# Patient Record
Sex: Male | Born: 1950
Health system: Southern US, Community
[De-identification: ages and names within clinical notes are randomized; demographics above are authoritative.]

## PROBLEM LIST (undated history)

## (undated) DIAGNOSIS — K219 Gastro-esophageal reflux disease without esophagitis: Secondary | ICD-10-CM

## (undated) DIAGNOSIS — F329 Major depressive disorder, single episode, unspecified: Secondary | ICD-10-CM

## (undated) DIAGNOSIS — F419 Anxiety disorder, unspecified: Secondary | ICD-10-CM

## (undated) DIAGNOSIS — E039 Hypothyroidism, unspecified: Secondary | ICD-10-CM

## (undated) DIAGNOSIS — G4730 Sleep apnea, unspecified: Secondary | ICD-10-CM

## (undated) DIAGNOSIS — E785 Hyperlipidemia, unspecified: Secondary | ICD-10-CM

## (undated) DIAGNOSIS — I10 Essential (primary) hypertension: Secondary | ICD-10-CM

## (undated) DIAGNOSIS — F32A Depression, unspecified: Secondary | ICD-10-CM

## (undated) HISTORY — PX: ESOPHAGOGASTRODUODENOSCOPY: SHX1529

## (undated) HISTORY — PX: TONSILLECTOMY AND ADENOIDECTOMY: SHX28

## (undated) HISTORY — PX: JOINT REPLACEMENT: SHX530

---

## 2005-12-03 ENCOUNTER — Ambulatory Visit: Payer: Self-pay | Admitting: Gastroenterology

## 2006-08-26 ENCOUNTER — Ambulatory Visit: Payer: Self-pay

## 2006-09-21 ENCOUNTER — Other Ambulatory Visit: Payer: Self-pay

## 2006-09-21 ENCOUNTER — Emergency Department: Payer: Self-pay | Admitting: Emergency Medicine

## 2006-10-18 ENCOUNTER — Emergency Department: Payer: Self-pay

## 2007-01-03 ENCOUNTER — Ambulatory Visit: Payer: Self-pay | Admitting: Neurosurgery

## 2007-06-27 ENCOUNTER — Ambulatory Visit: Payer: Self-pay | Admitting: Neurosurgery

## 2008-05-04 ENCOUNTER — Ambulatory Visit: Payer: Self-pay | Admitting: Internal Medicine

## 2009-04-11 ENCOUNTER — Ambulatory Visit: Payer: Self-pay | Admitting: Otolaryngology

## 2009-05-08 ENCOUNTER — Ambulatory Visit: Payer: Self-pay | Admitting: Internal Medicine

## 2009-05-29 ENCOUNTER — Ambulatory Visit: Payer: Self-pay | Admitting: Unknown Physician Specialty

## 2009-07-07 ENCOUNTER — Emergency Department: Payer: Self-pay | Admitting: Emergency Medicine

## 2010-05-15 ENCOUNTER — Ambulatory Visit: Payer: Self-pay | Admitting: Internal Medicine

## 2010-12-15 ENCOUNTER — Ambulatory Visit: Payer: Self-pay | Admitting: Internal Medicine

## 2011-01-12 ENCOUNTER — Ambulatory Visit: Payer: Self-pay | Admitting: Internal Medicine

## 2011-10-27 ENCOUNTER — Ambulatory Visit: Payer: Self-pay | Admitting: Vascular Surgery

## 2011-11-25 ENCOUNTER — Ambulatory Visit: Payer: Self-pay | Admitting: Vascular Surgery

## 2011-11-25 LAB — BASIC METABOLIC PANEL
Anion Gap: 8 (ref 7–16)
BUN: 22 mg/dL — ABNORMAL HIGH (ref 7–18)
Calcium, Total: 9.2 mg/dL (ref 8.5–10.1)
Co2: 27 mmol/L (ref 21–32)
EGFR (African American): >60
EGFR (Non-African Amer.): >60
Glucose: 85 mg/dL (ref 65–99)
Osmolality: 286 (ref 275–301)
Sodium: 142 mmol/L (ref 136–145)

## 2011-11-25 LAB — CBC
HCT: 41.5 % (ref 40.0–52.0)
MCH: 32.7 pg (ref 26.0–34.0)
MCHC: 33.9 g/dL (ref 32.0–36.0)
MCV: 97 fL (ref 80–100)
Platelet: 223 10*3/uL (ref 150–440)
RBC: 4.3 10*6/uL — ABNORMAL LOW (ref 4.40–5.90)
RDW: 12.9 % (ref 11.5–14.5)

## 2011-12-02 ENCOUNTER — Inpatient Hospital Stay: Payer: Self-pay | Admitting: Vascular Surgery

## 2011-12-02 HISTORY — PX: ABDOMINAL AORTIC ANEURYSM REPAIR: SUR1152

## 2011-12-02 LAB — CBC WITH DIFFERENTIAL/PLATELET
Basophil #: 0 10*3/uL (ref 0.0–0.1)
Basophil %: 0.5 %
Eosinophil #: 0.1 10*3/uL (ref 0.0–0.7)
HGB: 13.6 g/dL (ref 13.0–18.0)
Lymphocyte %: 20.2 %
MCH: 33.3 pg (ref 26.0–34.0)
MCHC: 34.4 g/dL (ref 32.0–36.0)
Monocyte #: 0.4 x10 3/mm (ref 0.2–1.0)
Monocyte %: 5.5 %
Neutrophil #: 4.9 10*3/uL (ref 1.4–6.5)
Neutrophil %: 72.7 %
Platelet: 177 10*3/uL (ref 150–440)
RDW: 12.9 % (ref 11.5–14.5)

## 2011-12-03 LAB — CBC WITH DIFFERENTIAL/PLATELET
Basophil #: 0 10*3/uL (ref 0.0–0.1)
Basophil %: 0.3 %
Eosinophil #: 0.1 10*3/uL (ref 0.0–0.7)
Eosinophil %: 0.6 %
HCT: 36.5 % — ABNORMAL LOW (ref 40.0–52.0)
HGB: 12.8 g/dL — ABNORMAL LOW (ref 13.0–18.0)
Lymphocyte %: 15.3 %
MCH: 33.7 pg (ref 26.0–34.0)
MCHC: 35 g/dL (ref 32.0–36.0)
Monocyte #: 0.6 x10 3/mm (ref 0.2–1.0)
Monocyte %: 6.3 %
Neutrophil #: 7.8 10*3/uL — ABNORMAL HIGH (ref 1.4–6.5)
Neutrophil %: 77.5 %
WBC: 10 10*3/uL (ref 3.8–10.6)

## 2011-12-03 LAB — COMPREHENSIVE METABOLIC PANEL
Albumin: 3.4 g/dL (ref 3.4–5.0)
Alkaline Phosphatase: 66 U/L (ref 50–136)
Anion Gap: 8 (ref 7–16)
BUN: 10 mg/dL (ref 7–18)
Calcium, Total: 8.2 mg/dL — ABNORMAL LOW (ref 8.5–10.1)
Glucose: 101 mg/dL — ABNORMAL HIGH (ref 65–99)
Potassium: 3.8 mmol/L (ref 3.5–5.1)
SGOT(AST): 15 U/L (ref 15–37)
Total Protein: 6.1 g/dL — ABNORMAL LOW (ref 6.4–8.2)

## 2011-12-03 LAB — PROTIME-INR
INR: 1.3
Prothrombin Time: 16.3 secs — ABNORMAL HIGH (ref 11.5–14.7)

## 2011-12-03 LAB — PHOSPHORUS: Phosphorus: 2.8 mg/dL (ref 2.5–4.9)

## 2011-12-03 LAB — APTT: Activated PTT: 32 secs (ref 23.6–35.9)

## 2011-12-03 LAB — MAGNESIUM: Magnesium: 1.4 mg/dL — ABNORMAL LOW

## 2013-04-27 ENCOUNTER — Ambulatory Visit: Payer: Self-pay | Admitting: Unknown Physician Specialty

## 2013-04-28 LAB — PATHOLOGY REPORT

## 2013-10-25 DIAGNOSIS — F419 Anxiety disorder, unspecified: Secondary | ICD-10-CM | POA: Insufficient documentation

## 2013-10-25 DIAGNOSIS — F32A Depression, unspecified: Secondary | ICD-10-CM | POA: Insufficient documentation

## 2013-10-25 DIAGNOSIS — J449 Chronic obstructive pulmonary disease, unspecified: Secondary | ICD-10-CM | POA: Insufficient documentation

## 2013-10-25 DIAGNOSIS — F329 Major depressive disorder, single episode, unspecified: Secondary | ICD-10-CM | POA: Insufficient documentation

## 2014-06-12 NOTE — Op Note (Signed)
PATIENT NAME:  Mitchell Powell, Mitchell Powell MR#:  [id number] DATE OF BIRTH:  17-May-1950  DATE OF PROCEDURE:  12/02/2011  PREOPERATIVE DIAGNOSIS:  1. Abdominal aortic aneurysm.  2. Hypertension.  3. Hypercholesterolemia.   POSTOPERATIVE DIAGNOSIS:  1. Abdominal aortic aneurysm. 2. Hypertension. 3. Hypercholesterolemia.  PROCEDURE PERFORMED:   1. Ultrasound guidance for vascular access to bilateral femoral arteries, right by Dr. Lucky Cowboy, left by Dr. Delana Meyer.   2. Catheter placement into aorta from bilateral femoral approaches, right by Dr. Lucky Cowboy, left by Dr. Delana Meyer.  3. Placement of a Gore Excluder endoprosthesis, main body right, 26 mm diameter main body, 14 mm limbs,  co-surgeons.  4. Placement of a right iliac extender, 14 mm diameter by Dr. Lucky Cowboy.   COSURGEONS: Leotis Pain, MD and [name], MD   ANESTHESIA: General.   ESTIMATED BLOOD LOSS: Approximately 100 mL.   FLUOROSCOPY TIME:  Approximately 10 minutes.   INDICATION FOR PROCEDURE: The patient is a 64 year old white male with a greater than 5 cm abdominal aortic aneurysm with acceptable anatomy for endovascular repair. The options for repair were discussed with the patient and he desired to proceed.   DESCRIPTION OF PROCEDURE: The patient was brought to the Vascular Interventional Radiology Suite. Groins were sterilely prepped and draped, and a sterile surgical field was created. He had nice femoral arteries by his preoperative imaging, so a percutaneous technique was planned. Ultrasound was used to access both femoral arteries under direct ultrasound guidance without difficulty, and 5-French sheaths were placed. The preclose technique was placed with two Perclose devices. I performed the right and Dr. Delana Meyer  performed the left, and then over a wire I put an 8-French sheath. A pigtail catheter was placed in the aorta from the right side and AP aortogram was performed. This demonstrated normal origins of the renal vessels with the known abdominal  aortic aneurysm. There was no significant iliac stenosis. I up sized to an 18-French sheath. The patient was systemically heparinized with 6000 units of intravenous heparin, and I placed the main body through the 18-French sheath on the right. Dr. Delana Meyer placed a Kumpe catheter up on the left, and magnified views were performed at the level of the renal arteries. A stick graft was then deployed just below the right renal artery, which was lower; and Dr. Delana Meyer cannulated the contralateral gate without difficulty with a Kumpe catheter and a Stiff-Angled Glidewire and confirmed successful cannulation with twirling a pigtail catheter in the main body. He then placed a Stiff Wire up and performed a retrograde arteriogram in the left femoral system and iliac system. A 14 mm diameter x 14 cm length contralateral limb was then deployed on the left through a 12-French  sheath terminating 1 to 2 cm above the hypogastric artery with several centimeters seal within the iliac artery. A retrograde arteriogram was performed on the right. There was still approximately 4 to 5 cm below the main body to the right hypogastric artery, and for this reason I selected a 14 mm diameter extender and deployed this to get more seal in the iliacs. All junction points and seal zones were ironed out with the Compliant balloon, and a pigtail catheter was then placed up the left side, and a completion angiogram was then performed. This demonstrated excellent location of the endoprosthesis just below the renal arteries with patent renal arteries present, just above the hypogastric arteries with hypogastric arteries patent. There was no type 1, 2 or 3 endoleak seen and good flow through the graft.  At this point, we elected to terminate the procedure. The sheathes were removed. The Perclose devices were completed on Dr. Nino Parsley side with excellent hemostatic result. On the right, a third Perclose device was placed over the wire in the usual  fashion for some mild oozing with good hemostasis achieved after the Perclose devices were deployed. Pressure was held. Sterile dressing was placed. The patient tolerated the procedure well and was taken to the recovery room in stable condition.  ____________________________ Algernon Huxley, MD jsd:cbb D: 12/02/2011 10:37:07 ET T: 12/02/2011 11:06:45 ET JOB#: [id number]  cc: Algernon Huxley, MD, <Dictator> Algernon Huxley MD ELECTRONICALLY SIGNED 12/03/2011 10:15

## 2014-06-12 NOTE — Op Note (Signed)
PATIENT NAME:  Mitchell Powell, Mitchell Powell MR#:  [id number] DATE OF BIRTH:  March 29, 1950  DATE OF PROCEDURE:  12/02/2011  PREOPERATIVE DIAGNOSES:  1. Abdominal aortic aneurysm.  2. Hypertension.  3. Hypercholesterolemia.   POSTOPERATIVE DIAGNOSES: 1. Abdominal aortic aneurysm.  2. Hypertension.  3. Hypercholesterolemia.   PROCEDURES PERFORMED:  1. Ultrasound-guided access bilateral common femoral arteries.  2. Introduction catheter into aorta, bilateral femoral approach.  3. Placement of a Gore Excluder endograft main body for treatment of abdominal aortic aneurysm.  4. Placement of right iliac extender 14 mm by Dr. Lucky Cowboy.  5. Closure of arterial puncture sites using the Perclose device in a pre-close fashion bilaterally.    CO-SURGEONS: Katha Cabal, MD and Algernon Huxley, MD    ANESTHESIA: General by endotracheal intubation.   FLUIDS: Per anesthesia record.   ESTIMATED BLOOD LOSS: 100 mL.   FLUOROSCOPY TIME: Approximately 10 minutes.   INDICATION FOR PROCEDURE: The patient is a 64 year old gentleman with an abdominal aortic aneurysm that is now identified as being larger than 5 cm in diameter by CT scan. He is a good endograft candidate. He is, therefore, undergoing repair of his abdominal aortic aneurysm using Endograft techniques to prevent lethal rupture.   DESCRIPTION OF PROCEDURE: The patient is brought to Special Procedures and placed in the supine position. After adequate general anesthesia is induced and appropriate invasive monitors are placed, he is positioned supine and prepped from approximately the level of his nipples down to his knees. With Dr. Lucky Cowboy working on the right side and myself on the left side, the common femoral arteries are accessed in a similar fashion. Ultrasound is placed in a sterile sleeve. Right side is accessed first. The femoral artery is identified. It is echolucent and pulsatile indicating patency. Image is recorded for the permanent record and under real-time  visualization Seldinger needle is inserted into the anterior wall of the common femoral artery. J-wire is then advanced under fluoroscopy without difficulty. As noted working in a similar fashion, left side is accessed by myself, ultrasound is utilized, image is recorded, puncture is made under real-time as noted. Wire is then advanced.   The pre-close technique is then used first on the right and then on the left. Perclose device is advanced over the wire, positioned at approximately 11 o'clock orientation, deployed and marked with a curved hemostat. A second device is then introduced over the same wire and deployed at the 2 o'clock orientation and marked with a straight hemostat. This was also performed on the left side as well by me.   8 [place] sheaths are then placed. Pigtail catheter is advanced up the right, KMP catheter up the left, and AP aortogram is obtained. After verifying length measurements, an 18 French sheath is advanced up the right side. Amplatz Super Stiff wire was placed through the pigtail catheter. Pigtail catheter and 8 French sheath were removed. Subsequently, a 26 mm diameter main body is advanced up the right side and positioned just below the renals. KMP catheter that has been positioned for my side is then used in a magnified view to verify renal artery locations and the main body is deployed down through opening the contralateral gate. KMP catheter is then pulled back and using a Glidewire and the KMP catheter, the gate is captured and KMP catheter Glidewire are advanced into the main body. KMP is then exchanged for a pigtail catheter. Pigtail catheter is twirled to verify intraluminal positioning. It is then advanced up just above the graft  and another image is obtained verifying the graft has been deployed below the renals. Amplatz Super Stiff wire is then advanced through the pigtail catheter. The 8 French sheath is removed. While the pigtail catheter was in position, retrograde  injection through the 8 French sheath was made and length measurements for the contralateral limb were verified. A 14 x 14 limb was then opened onto the field. Sheath was upsized to a 12 [place] over the Super Stiff wire. The limb was then advanced through and positioned within the gate and deployed without difficulty. Coda balloon was then advanced through the 12 [place] sheath and the proximal as well as the length of the contralateral limb were angioplastied using the Coda balloon. The remaining portion of the main body was deployed. Retrograde injection demonstrates that there is only 1 to 2 cm of stent extending into the iliac and, therefore, a 14 mm extender cuff was advanced through the right side and deployed extending the right limb down to just above the iliac bifurcation. Coda balloon was then advanced through the right-sided sheath and used again to angioplasty the proximal as well as the entire length of the ipsilateral limb.   Pigtail catheter was then readvanced, positioned just above the stent graft, and bolus injection of contrast with delayed run was performed. A very faint type II endoleak was noted. Otherwise, the stent is in excellent position. There are no type I/type III endoleaks.   The left side was then treated first. With the pre-closed fashion the knots for the device were secured after removing the sheath. After the first knot is tied down there is minimal oozing and, therefore, the wire is removed. Second knot is then secured as well and there is complete hemostasis.   The right side is then addressed first securing the sutures, however, after both knots have been tied down fairly tight there is still significant oozing and it is elected to take a third Perclose, advance it over the wire and deployed in the 12 o'clock orientation. Following this, there is very little oozing and the wire is removed and all three knots have been secured one more time. This achieves complete  hemostasis, the sutures were cut, and light pressure is held. There were no immediate complications.   INTERPRETATION: Initial views demonstrate the aneurysm. At the completion there is a faint type II endoleak. No type I/type III endoleaks. There is adequate hemostasis with the Perclose devices.    ____________________________ Katha Cabal, MD ggs:drc D: 12/08/2011 08:56:08 ET T: 12/08/2011 09:18:49 ET JOB#: [id number]  cc: Katha Cabal, MD, <Dictator> Katha Cabal MD ELECTRONICALLY SIGNED 12/09/2011 12:34

## 2014-10-31 DIAGNOSIS — R0602 Shortness of breath: Secondary | ICD-10-CM | POA: Insufficient documentation

## 2014-11-21 ENCOUNTER — Emergency Department
Admission: EM | Admit: 2014-11-21 | Discharge: 2014-11-21 | Disposition: A | Discharge status: Home / Self Care | Payer: BLUE CROSS/BLUE SHIELD | Attending: Emergency Medicine | Admitting: Emergency Medicine

## 2014-11-21 ENCOUNTER — Emergency Department: Payer: Self-pay

## 2014-11-21 ENCOUNTER — Encounter: Payer: Self-pay | Admitting: Emergency Medicine

## 2014-11-21 DIAGNOSIS — K852 Alcohol induced acute pancreatitis without necrosis or infection: Secondary | ICD-10-CM

## 2014-11-21 DIAGNOSIS — I10 Essential (primary) hypertension: Secondary | ICD-10-CM | POA: Insufficient documentation

## 2014-11-21 DIAGNOSIS — F1010 Alcohol abuse, uncomplicated: Secondary | ICD-10-CM | POA: Insufficient documentation

## 2014-11-21 HISTORY — DX: Gastro-esophageal reflux disease without esophagitis: K21.9

## 2014-11-21 HISTORY — DX: Hyperlipidemia, unspecified: E78.5

## 2014-11-21 HISTORY — DX: Essential (primary) hypertension: I10

## 2014-11-21 LAB — COMPREHENSIVE METABOLIC PANEL
ALK PHOS: 72 U/L (ref 38–126)
ALT: 21 U/L (ref 17–63)
AST: 22 U/L (ref 15–41)
Albumin: 4.8 g/dL (ref 3.5–5.0)
Anion gap: 8 (ref 5–15)
BUN: 16 mg/dL (ref 6–20)
CALCIUM: 9.8 mg/dL (ref 8.9–10.3)
CO2: 27 mmol/L (ref 22–32)
CREATININE: 0.86 mg/dL (ref 0.61–1.24)
Chloride: 105 mmol/L (ref 101–111)
Glucose, Bld: 92 mg/dL (ref 65–99)
Potassium: 3.8 mmol/L (ref 3.5–5.1)
Sodium: 140 mmol/L (ref 135–145)
TOTAL PROTEIN: 8 g/dL (ref 6.5–8.1)
Total Bilirubin: 0.7 mg/dL (ref 0.3–1.2)

## 2014-11-21 LAB — URINALYSIS COMPLETE WITH MICROSCOPIC (ARMC ONLY)
Bacteria, UA: NONE SEEN
Bilirubin Urine: NEGATIVE
Glucose, UA: NEGATIVE mg/dL
Hgb urine dipstick: NEGATIVE
KETONES UR: NEGATIVE mg/dL
LEUKOCYTES UA: NEGATIVE
NITRITE: NEGATIVE
PH: 5 (ref 5.0–8.0)
PROTEIN: NEGATIVE mg/dL
SPECIFIC GRAVITY, URINE: 1.011 (ref 1.005–1.030)

## 2014-11-21 LAB — CBC
HCT: 45.9 % (ref 40.0–52.0)
Hemoglobin: 15.3 g/dL (ref 13.0–18.0)
MCH: 32.6 pg (ref 26.0–34.0)
MCHC: 33.3 g/dL (ref 32.0–36.0)
MCV: 98 fL (ref 80.0–100.0)
PLATELETS: 203 10*3/uL (ref 150–440)
RBC: 4.69 MIL/uL (ref 4.40–5.90)
RDW: 13 % (ref 11.5–14.5)
WBC: 13.3 10*3/uL — AB (ref 3.8–10.6)

## 2014-11-21 LAB — LIPASE, BLOOD: LIPASE: 738 U/L — AB (ref 22–51)

## 2014-11-21 MED ORDER — RANITIDINE HCL 150 MG PO CAPS: 150.0000 mg | ORAL_CAPSULE | Freq: Two times a day (BID) | ORAL | Status: DC

## 2014-11-21 MED ORDER — OXYCODONE-ACETAMINOPHEN 5-325 MG PO TABS: 1.0000 | ORAL_TABLET | Freq: Four times a day (QID) | ORAL | Status: DC | PRN

## 2014-11-21 NOTE — Discharge Instructions (Signed)

## 2014-11-21 NOTE — ED Notes (Signed)
Pt to ed with c/o abd pain left upper quad pain, denies vomiting, denies diarrhea.

## 2014-11-21 NOTE — ED Provider Notes (Signed)
[facility] Emergency Department Provider Note     Time seen: ----------------------------------------- 4:40 PM on 11/21/2014 -----------------------------------------    I have reviewed the triage vital signs and the nursing notes.   HISTORY  Chief Complaint Abdominal Pain    HPI Mitchell Powell is a 64 y.o. male who presents ER for left sided abdominal pain. Pain is dull, nothing makes it better or worse. Patient denies fevers, chills, chest pain, vomiting or diarrhea. Patient denies blood in the stool or urine. Patient's had a kidney stone years ago, does not remember what it felt like.   Past Medical History  Diagnosis Date  . Hypertension   . Hyperlipidemia   . GERD (gastroesophageal reflux disease)     There are no active problems to display for this patient.   History reviewed. No pertinent past surgical history.  Allergies Review of patient's allergies indicates no known allergies.  Social History Social History  Substance Use Topics  . Smoking status: Never Smoker   . Smokeless tobacco: None  . Alcohol Use: Yes    Review of Systems Constitutional: Negative for fever. Eyes: Negative for visual changes. ENT: Negative for sore throat. Cardiovascular: Negative for chest pain. Respiratory: Negative for shortness of breath. Gastrointestinal: Positive for abdominal pain, negative for vomiting and diarrhea Genitourinary: Negative for dysuria. Musculoskeletal: Negative for back pain. Skin: Negative for rash. Neurological: Negative for headaches, focal weakness or numbness.  10-point ROS otherwise negative.  ____________________________________________   PHYSICAL EXAM:  VITAL SIGNS: ED Triage Vitals  Enc Vitals Group     BP 11/21/14 1534 130/89 mmHg     Pulse Rate 11/21/14 1534 57     Resp 11/21/14 1534 18     Temp 11/21/14 1534 98.3 F (36.8 C)     Temp Source 11/21/14 1534 Oral     SpO2 11/21/14 1534 97 %     Weight --       Height --      Head Cir --      Peak Flow --      Pain Score 11/21/14 1455 7     Pain Loc --      Pain Edu? --      Excl. in [place]? --     Constitutional: Alert and oriented. Well appearing and in no distress. Eyes: Conjunctivae are normal. PERRL. Normal extraocular movements. ENT   Head: Normocephalic and atraumatic.   Nose: No congestion/rhinnorhea.   Mouth/Throat: Mucous membranes are moist.   Neck: No stridor. Cardiovascular: Normal rate, regular rhythm. Normal and symmetric distal pulses are present in all extremities. No murmurs, rubs, or gallops. Respiratory: Normal respiratory effort without tachypnea nor retractions. Breath sounds are clear and equal bilaterally. No wheezes/rales/rhonchi. Gastrointestinal: Left mid quadrant tenderness, no rebound or guarding. Normal bowel sounds. Musculoskeletal: Nontender with normal range of motion in all extremities. No joint effusions.  No lower extremity tenderness nor edema. Neurologic:  Normal speech and language. No gross focal neurologic deficits are appreciated. Speech is normal. No gait instability. Skin:  Skin is warm, dry and intact. No rash noted. Psychiatric: Mood and affect are normal. Speech and behavior are normal. Patient exhibits appropriate insight and judgment.  ____________________________________________  ED COURSE:  Pertinent labs & imaging results that were available during my care of the patient were reviewed by me and considered in my medical decision making (see chart for details). Check basic labs, likely imaging to rule out renal colic versus diverticulitis. ____________________________________________    LABS (pertinent  positives/negatives)  Labs Reviewed  LIPASE, BLOOD - Abnormal; Notable for the following:    Lipase 738 (*)    All other components within normal limits  CBC - Abnormal; Notable for the following:    WBC 13.3 (*)    All other components within normal limits  URINALYSIS  COMPLETEWITH MICROSCOPIC ([facility] ONLY) - Abnormal; Notable for the following:    Color, Urine STRAW (*)    APPearance CLEAR (*)    Squamous Epithelial / LPF 0-5 (*)    All other components within normal limits  COMPREHENSIVE METABOLIC PANEL    RADIOLOGY Images were viewed by me  CT renal protocol  IMPRESSION: Findings consistent with pancreatitis without evidence of pancreatic pseudocyst or pancreatic necrosis. Clinical correlation with pancreatic enzymes is recommended.  No nephrolithiasis or hydroureteronephrosis bilaterally. ____________________________________________  FINAL ASSESSMENT AND PLAN  Flank pain, pancreatitis  Plan: Patient with labs and imaging as dictated above.  Patient admits to drinking alcohol daily, likely the etiology for his pancreatitis. He has very mild pain at this moment, despite his markedly lipase elevation he wants to go home. He is advised to have limited dietary intake, he'll be given pain medicine and antiemetics at home. He'll be referred to gastroenterology for follow-up. Pancreatitis is alcohol related   Earleen Newport, MD   Earleen Newport, MD 11/21/14 2029

## 2015-01-01 ENCOUNTER — Other Ambulatory Visit: Payer: Self-pay | Admitting: Internal Medicine

## 2015-01-01 DIAGNOSIS — R1012 Left upper quadrant pain: Secondary | ICD-10-CM

## 2015-01-07 ENCOUNTER — Ambulatory Visit: Payer: Self-pay

## 2015-01-11 ENCOUNTER — Ambulatory Visit
Admission: RE | Admit: 2015-01-11 | Discharge: 2015-01-11 | Disposition: A | Discharge status: Home / Self Care | Payer: PPO | Source: Ambulatory Visit | Attending: Internal Medicine | Admitting: Internal Medicine

## 2015-01-11 DIAGNOSIS — R1012 Left upper quadrant pain: Secondary | ICD-10-CM | POA: Diagnosis not present

## 2016-01-21 DIAGNOSIS — G4733 Obstructive sleep apnea (adult) (pediatric): Secondary | ICD-10-CM | POA: Insufficient documentation

## 2016-10-22 ENCOUNTER — Other Ambulatory Visit (INDEPENDENT_AMBULATORY_CARE_PROVIDER_SITE_OTHER): Payer: Self-pay | Admitting: Vascular Surgery

## 2016-10-22 DIAGNOSIS — I739 Peripheral vascular disease, unspecified: Secondary | ICD-10-CM

## 2016-10-22 DIAGNOSIS — I700 Atherosclerosis of aorta: Secondary | ICD-10-CM

## 2016-10-22 DIAGNOSIS — I724 Aneurysm of artery of lower extremity: Secondary | ICD-10-CM

## 2016-10-22 DIAGNOSIS — Z95828 Presence of other vascular implants and grafts: Secondary | ICD-10-CM

## 2016-10-27 ENCOUNTER — Ambulatory Visit (INDEPENDENT_AMBULATORY_CARE_PROVIDER_SITE_OTHER): Payer: Medicare HMO | Admitting: Vascular Surgery

## 2016-10-27 ENCOUNTER — Ambulatory Visit (INDEPENDENT_AMBULATORY_CARE_PROVIDER_SITE_OTHER): Payer: Medicare HMO

## 2016-10-27 ENCOUNTER — Encounter (INDEPENDENT_AMBULATORY_CARE_PROVIDER_SITE_OTHER): Payer: Self-pay | Admitting: Vascular Surgery

## 2016-10-27 VITALS — BP 129/72 | HR 49 | Resp 15 | Ht 73.0 in | Wt 209.0 lb

## 2016-10-27 DIAGNOSIS — I724 Aneurysm of artery of lower extremity: Secondary | ICD-10-CM | POA: Diagnosis not present

## 2016-10-27 DIAGNOSIS — I700 Atherosclerosis of aorta: Secondary | ICD-10-CM

## 2016-10-27 DIAGNOSIS — Z95828 Presence of other vascular implants and grafts: Secondary | ICD-10-CM | POA: Diagnosis not present

## 2016-10-27 DIAGNOSIS — I10 Essential (primary) hypertension: Secondary | ICD-10-CM | POA: Diagnosis not present

## 2016-10-27 DIAGNOSIS — I739 Peripheral vascular disease, unspecified: Secondary | ICD-10-CM | POA: Diagnosis not present

## 2016-10-27 DIAGNOSIS — I714 Abdominal aortic aneurysm, without rupture, unspecified: Secondary | ICD-10-CM | POA: Insufficient documentation

## 2016-10-27 NOTE — Assessment & Plan Note (Signed)
His noninvasive studies demonstrate stable bilateral femoral artery aneurysms measuring 1.5 cm on the right and 1.6 cm on the left. His popliteal artery aneurysms are also stable and measured 1.2 cm on the right and 1.0 cm on the left. No significant lower extremity arterial stenosis is identified in either lower extremity and his ABIs are normal at 1.2 bilaterally.  He has moderate sized bilateral femoral and popliteal artery aneurysms without associated occlusive disease. These have been stable. Tobacco and blood pressure control or again stressed. Plan to recheck on an annual basis. No intervention required at this size.

## 2016-10-27 NOTE — Assessment & Plan Note (Signed)
5 years status post endovascular repair. Aorta measuring only 3.3 cm without endoleak. Recheck in 1 year

## 2016-10-27 NOTE — Patient Instructions (Signed)
Abdominal Aortic Aneurysm Endograft Repair, Care After This sheet gives you information about how to care for yourself after your procedure. Your health care provider may also give you more specific instructions. If you have problems or questions, contact your health care provider. What can I expect after the procedure? After the procedure, it is common to have:  Pain or soreness at the incision site.  Tiredness (fatigue).  Follow these instructions at home: Activity  Get plenty of rest.  Follow instructions from your health care provider about how much you should move around and how far you should go when you take short walks. Start walking farther when your health care provider says it is okay.  Limit your activities as told by your health care provider.  Return to your normal activities as told by your health care provider. Ask your health care provider what activities are safe for you. Incision care   Follow instructions from your health care provider about how to take care of your incisions. Make sure you: ? Wash your hands with soap and water before you change your bandage (dressing). If soap and water are not available, use hand sanitizer. ? Change your dressing as told by your health care provider. ? Leave stitches (sutures), skin glue, or adhesive strips in place. These skin closures may need to stay in place for 2 weeks or longer. If adhesive strip edges start to loosen and curl up, you may trim the loose edges. Do not remove adhesive strips completely unless your health care provider tells you to do that.  Keep the incision area clean and dry.  Do not take baths, swim, or use a hot tub until your health care provider approves. Ask your health care provider if you can take showers. You may only be allowed to take sponge baths for bathing.  Check your incision area every day for signs of infection. Check for: ? More redness, swelling, or pain. ? More fluid or  blood. ? Warmth. ? Pus or a bad smell. Lifestyle  Do not use any products that contain nicotine or tobacco, such as cigarettes and e-cigarettes. If you need help quitting, ask your health care provider.  Make any other lifestyle changes that your health care provider suggests. These may include: ? Keeping your blood pressure under control. ? Finding ways to lower stress. ? Eating healthy foods that are good for your heart, such as vegetables, fruits, and whole grains that add fiber to your diet. ? Getting regular exercise. General instructions  Take over-the-counter and prescription medicines only as told by your health care provider.  Keep all follow-up visits as told by your health care provider. This is important. Contact a health care provider if:  You have pain in your abdomen, chest, or back.  You have more redness, swelling, or pain around an incision.  You have more fluid or blood coming from an incision.  Your incision feels warm to the touch.  You have pus or a bad smell coming from an incision.  You have a fever. Get help right away if:  You have trouble breathing.  You suddenly have pain in your legs or you have trouble moving either of your legs.  You faint or you feel very light-headed. This information is not intended to replace advice given to you by your health care provider. Make sure you discuss any questions you have with your health care provider. Document Released: 10/31/2014 Document Revised: 08/30/2015 Document Reviewed: 05/06/2015 Elsevier Interactive Patient Education    2018 [employer]  

## 2016-10-27 NOTE — Assessment & Plan Note (Signed)
blood pressure control important in reducing the progression of atherosclerotic disease. On appropriate oral medications.  

## 2016-10-27 NOTE — Progress Notes (Signed)
MRN : [id number]  Mitchell Powell is a 66 y.o. (27-Oct-1950) male who presents with chief complaint of  Chief Complaint  Patient presents with  . Follow-up    1 Year u/s follow up  .  History of Present Illness: Patient returns today in follow up of aneurysmal disease. He is 5 years status post stent graft repair for an abdominal aortic aneurysm and has known common femoral and popliteal artery aneurysms that we have been watching with noninvasive studies. He is doing well without complaints today. He is quit smoking. His blood pressure is well controlled. His noninvasive studies demonstrate stable bilateral femoral artery aneurysms measuring 1.5 cm on the right and 1.6 cm on the left. His popliteal artery aneurysms are also stable and measured 1.2 cm on the right and 1.0 cm on the left. No significant lower extremity arterial stenosis is identified in either lower extremity and his ABIs are normal at 1.2 bilaterally. He is also studded with an aortic duplex. He is 5 years status post endovascular repair. His sac measures only 3.3 cm in maximal diameter and no endoleak is identified.  Current Outpatient Prescriptions  Medication Sig Dispense Refill  . albuterol (PROVENTIL HFA;VENTOLIN HFA) 108 (90 Base) MCG/ACT inhaler Inhale into the lungs.    Marland Kitchen aspirin EC 81 MG tablet Take by mouth.    Marland Kitchen azelastine (ASTELIN) 0.1 % nasal spray Place into the nose.    . benazepril (LOTENSIN) 20 MG tablet Take by mouth.    . clonazePAM (KLONOPIN) 0.5 MG tablet     . FLUoxetine (PROZAC) 20 MG capsule Take by mouth.    . fluticasone (FLONASE) 50 MCG/ACT nasal spray Place into the nose.    . loratadine (CLARITIN) 10 MG tablet Take by mouth.    . montelukast (SINGULAIR) 10 MG tablet Take by mouth.    . Omega-3 Fatty Acids (FISH OIL) 1000 MG CPDR Take by mouth.    Marland Kitchen omeprazole (PRILOSEC) 20 MG capsule     . oxyCODONE-acetaminophen (ROXICET) 5-325 MG tablet Take 1 tablet by mouth every 6 (six) hours as needed for  severe pain. 20 tablet 0  . pravastatin (PRAVACHOL) 40 MG tablet Take by mouth.    . ranitidine (ZANTAC) 150 MG capsule Take 1 capsule (150 mg total) by mouth 2 (two) times daily. 60 capsule 0   No current facility-administered medications for this visit.     Past Medical History:  Diagnosis Date  . GERD (gastroesophageal reflux disease)   . Hyperlipidemia   . Hypertension     No past surgical history on file.  Social History Previous smoker and quit many years ago. No alcohol abuse.  Family History No bleeding or clotting disorders  Allergies  Allergen Reactions  . Prednisone     Other reaction(s): Other (See Comments) irritiability     REVIEW OF SYSTEMS (Negative unless checked)  Constitutional: [] Weight loss  [] Fever  [] Chills Cardiac: [] Chest pain   [] Chest pressure   [] Palpitations   [] Shortness of breath when laying flat   [] Shortness of breath at rest   [] Shortness of breath with exertion. Vascular:  [] Pain in legs with walking   [] Pain in legs at rest   [] Pain in legs when laying flat   [] Claudication   [] Pain in feet when walking  [] Pain in feet at rest  [] Pain in feet when laying flat   [] History of DVT   [] Phlebitis   [x] Swelling in legs   [] Varicose veins   [] Non-healing ulcers  Pulmonary:   [] Uses home oxygen   [] Productive cough   [] Hemoptysis   [] Wheeze  [] COPD   [] Asthma Neurologic:  [] Dizziness  [] Blackouts   [] Seizures   [] History of stroke   [] History of TIA  [] Aphasia   [] Temporary blindness   [] Dysphagia   [] Weakness or numbness in arms   [] Weakness or numbness in legs Musculoskeletal:  [] Arthritis   [x] Joint swelling   [] Joint pain   [] Low back pain Hematologic:  [] Easy bruising  [] Easy bleeding   [] Hypercoagulable state   [] Anemic   Gastrointestinal:  [] Blood in stool   [] Vomiting blood  [] Gastroesophageal reflux/heartburn   [] Abdominal pain Genitourinary:  [] Chronic kidney disease   [] Difficult urination  [] Frequent urination  [] Burning with urination    [] Hematuria Skin:  [] Rashes   [] Ulcers   [] Wounds Psychological:  [] History of anxiety   []  History of major depression.  Physical Examination  BP 129/72 (BP Location: Right Arm)   Pulse (!) 49   Resp 15   Ht 6\' 1"  (1.854 m)   Wt 94.8 kg (209 lb)   BMI 27.57 kg/m  Gen:  WD/WN, NAD Head: [place]/AT, No temporalis wasting. Ear/Nose/Throat: Hearing grossly intact, nares w/o erythema or drainage, trachea midline Eyes: Conjunctiva clear. Sclera non-icteric Neck: Supple.  No JVD.  Pulmonary:  Good air movement, no use of accessory muscles.  Cardiac: RRR, normal S1, S2 Vascular:  Vessel Right Left  Radial Palpable Palpable              Aorta Not palpable N/A  Femoral Enlarged, Palpable Enlarged, Palpable  Popliteal Enlarged, Palpable Enlarged, Palpable  PT Palpable Palpable  DP Palpable Palpable    Musculoskeletal: M/S 5/5 throughout.  No deformity or atrophy. No edema. Neurologic: Sensation grossly intact in extremities.  Symmetrical.  Speech is fluent.  Psychiatric: Judgment intact, Mood & affect appropriate for pt's clinical situation. Dermatologic: No rashes or ulcers noted.  No cellulitis or open wounds.       Labs No results found for this or any previous visit (from the past 2160 hour(s)).  Radiology No results found.   Assessment/Plan  Hypertension blood pressure control important in reducing the progression of atherosclerotic disease. On appropriate oral medications.   AAA (abdominal aortic aneurysm) without rupture ([place]) 5 years status post endovascular repair. Aorta measuring only 3.3 cm without endoleak. Recheck in 1 year  Popliteal artery aneurysm (HCC) His noninvasive studies demonstrate stable bilateral femoral artery aneurysms measuring 1.5 cm on the right and 1.6 cm on the left. His popliteal artery aneurysms are also stable and measured 1.2 cm on the right and 1.0 cm on the left. No significant lower extremity arterial stenosis is identified in either  lower extremity and his ABIs are normal at 1.2 bilaterally.  He has moderate sized bilateral femoral and popliteal artery aneurysms without associated occlusive disease. These have been stable. Tobacco and blood pressure control or again stressed. Plan to recheck on an annual basis. No intervention required at this size.  Femoral artery aneurysm, bilateral (HCC) His noninvasive studies demonstrate stable bilateral femoral artery aneurysms measuring 1.5 cm on the right and 1.6 cm on the left. His popliteal artery aneurysms are also stable and measured 1.2 cm on the right and 1.0 cm on the left. No significant lower extremity arterial stenosis is identified in either lower extremity and his ABIs are normal at 1.2 bilaterally.  He has moderate sized bilateral femoral and popliteal artery aneurysms without associated occlusive disease. These have been stable. Tobacco and  blood pressure control or again stressed. Plan to recheck on an annual basis. No intervention required at this size.    Leotis Pain, MD  10/27/2016 10:07 AM    This note was created with Dragon medical transcription system.  Any errors from dictation are purely unintentional

## 2017-10-27 DIAGNOSIS — K219 Gastro-esophageal reflux disease without esophagitis: Secondary | ICD-10-CM | POA: Insufficient documentation

## 2017-10-27 DIAGNOSIS — E785 Hyperlipidemia, unspecified: Secondary | ICD-10-CM | POA: Insufficient documentation

## 2017-10-27 NOTE — Progress Notes (Signed)
MRN : [id number]  Mitchell Powell is a 68 y.o. (1950-03-04) male who presents with chief complaint of No chief complaint on file. Marland Kitchen  History of Present Illness:   The patient returns to the office for surveillance of an abdominal aortic aneurysm status post stent graft placement on 11/2011.   Patient denies abdominal pain or back pain, no other abdominal complaints. No groin related complaints. No symptoms consistent with distal embolization No changes in claudication distance.   There have been no interval changes in his overall healthcare since his last visit.   He is quit smoking.  Patient denies amaurosis fugax or TIA symptoms. There is no history of claudication or rest pain symptoms of the lower extremities. The patient denies angina or shortness of breath.   Previous studies: Duplex US of the aorta and iliac arteries shows a 3.3 cm AAA sac with no endoleak, no in the sac compared to the previous study.  Bilateral femoral artery aneurysms measuring 1.5 cm on the right and 1.6 cm on the left. Popliteal artery aneurysms are also stable and measured 1.2 cm on the right and 1.0 cm on the left.    No outpatient medications have been marked as taking for the 10/28/17 encounter (Appointment) with Delana Meyer, Dolores Lory, MD.    Past Medical History:  Diagnosis Date  . GERD (gastroesophageal reflux disease)   . Hyperlipidemia   . Hypertension     No past surgical history on file.  Social History Social History   Tobacco Use  . Smoking status: Never Smoker  . Smokeless tobacco: Never Used  Substance Use Topics  . Alcohol use: Yes  . Drug use: No    Family History No family history on file.  Allergies  Allergen Reactions  . Prednisone     Other reaction(s): Other (See Comments) irritiability     REVIEW OF SYSTEMS (Negative unless checked)  Constitutional: [] Weight loss  [] Fever  [] Chills Cardiac: [] Chest pain   [] Chest pressure   [] Palpitations   [] Shortness of  breath when laying flat   [] Shortness of breath with exertion. Vascular:  [] Pain in legs with walking   [] Pain in legs at rest  [] History of DVT   [] Phlebitis   [] Swelling in legs   [] Varicose veins   [] Non-healing ulcers Pulmonary:   [] Uses home oxygen   [] Productive cough   [] Hemoptysis   [] Wheeze  [] COPD   [] Asthma Neurologic:  [] Dizziness   [] Seizures   [] History of stroke   [] History of TIA  [] Aphasia   [] Vissual changes   [] Weakness or numbness in arm   [] Weakness or numbness in leg Musculoskeletal:   [] Joint swelling   [] Joint pain   [] Low back pain Hematologic:  [] Easy bruising  [] Easy bleeding   [] Hypercoagulable state   [] Anemic Gastrointestinal:  [] Diarrhea   [] Vomiting  [] Gastroesophageal reflux/heartburn   [] Difficulty swallowing. Genitourinary:  [] Chronic kidney disease   [] Difficult urination  [] Frequent urination   [] Blood in urine Skin:  [] Rashes   [] Ulcers  Psychological:  [] History of anxiety   []  History of major depression.  Physical Examination  There were no vitals filed for this visit. There is no height or weight on file to calculate BMI. Gen: WD/WN, NAD Head: [place]/AT, No temporalis wasting.  Ear/Nose/Throat: Hearing grossly intact, nares w/o erythema or drainage Eyes: PER, EOMI, sclera nonicteric.  Neck: Supple, no large masses.   Pulmonary:  Good air movement, no audible wheezing bilaterally, no use of accessory muscles.  Cardiac: RRR,  no JVD Vascular:  Vessel Right Left  Radial Palpable Palpable  Gastrointestinal: Non-distended. No guarding/no peritoneal signs.  Musculoskeletal: M/S 5/5 throughout.  No deformity or atrophy.  Neurologic: CN 2-12 intact. Symmetrical.  Speech is fluent. Motor exam as listed above. Psychiatric: Judgment intact, Mood & affect appropriate for pt's clinical situation. Dermatologic: No rashes or ulcers noted.  No changes consistent with cellulitis. Lymph : No lichenification or skin changes of chronic lymphedema.  CBC Lab Results    Component Value Date   WBC 13.3 (H) 11/21/2014   HGB 15.3 11/21/2014   HCT 45.9 11/21/2014   MCV 98.0 11/21/2014   PLT 203 11/21/2014    BMET    Component Value Date/Time   NA 140 11/21/2014 1455   NA 141 12/03/2011 0450   K 3.8 11/21/2014 1455   K 3.8 12/03/2011 0450   CL 105 11/21/2014 1455   CL 107 12/03/2011 0450   CO2 27 11/21/2014 1455   CO2 26 12/03/2011 0450   GLUCOSE 92 11/21/2014 1455   GLUCOSE 101 (H) 12/03/2011 0450   BUN 16 11/21/2014 1455   BUN 10 12/03/2011 0450   CREATININE 0.86 11/21/2014 1455   CREATININE 0.85 12/03/2011 0450   CALCIUM 9.8 11/21/2014 1455   CALCIUM 8.2 (L) 12/03/2011 0450   GFRNONAA >60 11/21/2014 1455   GFRNONAA >60 12/03/2011 0450   GFRAA >60 11/21/2014 1455   GFRAA >60 12/03/2011 0450   CrCl cannot be calculated (Patient's most recent lab result is older than the maximum 21 days allowed.).  COAG Lab Results  Component Value Date   INR 1.3 12/03/2011    Radiology No results found.   Assessment/Plan 1. AAA (abdominal aortic aneurysm) without rupture [facility]) Patient will return with appropriate noninvasive studies and follow-up with Dr. Lucky Cowboy.  No charge for today's visit  2. Popliteal artery aneurysm ([place]) See #1  3. Essential hypertension Continue antihypertensive medications as already ordered, these medications have been reviewed and there are no changes at this time.   4. Femoral artery aneurysm, bilateral ([place]) See #1  5. Gastroesophageal reflux disease, esophagitis presence not specified Continue PPI as already ordered, this medication has been reviewed and there are no changes at this time.  Avoidence of caffeine and alcohol  Moderate elevation of the head of the bed   6. Mixed hyperlipidemia Continue statin as ordered and reviewed, no changes at this time     [name], MD  10/27/2017 1:50 PM

## 2017-10-28 ENCOUNTER — Ambulatory Visit (INDEPENDENT_AMBULATORY_CARE_PROVIDER_SITE_OTHER): Payer: Medicare HMO | Admitting: Vascular Surgery

## 2017-10-28 ENCOUNTER — Encounter (INDEPENDENT_AMBULATORY_CARE_PROVIDER_SITE_OTHER): Payer: Self-pay | Admitting: Vascular Surgery

## 2017-10-28 VITALS — BP 130/74 | HR 52 | Resp 16 | Ht 73.0 in | Wt 210.4 lb

## 2017-10-28 DIAGNOSIS — E782 Mixed hyperlipidemia: Secondary | ICD-10-CM

## 2017-10-28 DIAGNOSIS — K219 Gastro-esophageal reflux disease without esophagitis: Secondary | ICD-10-CM

## 2017-10-28 DIAGNOSIS — I724 Aneurysm of artery of lower extremity: Secondary | ICD-10-CM

## 2017-10-28 DIAGNOSIS — I714 Abdominal aortic aneurysm, without rupture, unspecified: Secondary | ICD-10-CM

## 2017-10-28 DIAGNOSIS — I10 Essential (primary) hypertension: Secondary | ICD-10-CM

## 2017-10-29 ENCOUNTER — Encounter (INDEPENDENT_AMBULATORY_CARE_PROVIDER_SITE_OTHER): Payer: Self-pay | Admitting: Vascular Surgery

## 2017-11-12 DIAGNOSIS — R079 Chest pain, unspecified: Secondary | ICD-10-CM | POA: Insufficient documentation

## 2017-11-19 ENCOUNTER — Ambulatory Visit (INDEPENDENT_AMBULATORY_CARE_PROVIDER_SITE_OTHER): Payer: Medicare HMO | Admitting: Nurse Practitioner

## 2017-11-19 ENCOUNTER — Ambulatory Visit (INDEPENDENT_AMBULATORY_CARE_PROVIDER_SITE_OTHER): Payer: Medicare HMO

## 2017-11-19 ENCOUNTER — Encounter (INDEPENDENT_AMBULATORY_CARE_PROVIDER_SITE_OTHER): Payer: Self-pay | Admitting: Nurse Practitioner

## 2017-11-19 ENCOUNTER — Encounter (INDEPENDENT_AMBULATORY_CARE_PROVIDER_SITE_OTHER): Payer: Medicare HMO

## 2017-11-19 VITALS — BP 138/80 | HR 67 | Resp 16 | Ht 73.0 in | Wt 211.0 lb

## 2017-11-19 DIAGNOSIS — I714 Abdominal aortic aneurysm, without rupture, unspecified: Secondary | ICD-10-CM

## 2017-11-19 DIAGNOSIS — I724 Aneurysm of artery of lower extremity: Secondary | ICD-10-CM | POA: Diagnosis not present

## 2017-11-19 DIAGNOSIS — K219 Gastro-esophageal reflux disease without esophagitis: Secondary | ICD-10-CM | POA: Diagnosis not present

## 2017-11-22 ENCOUNTER — Encounter (INDEPENDENT_AMBULATORY_CARE_PROVIDER_SITE_OTHER): Payer: Self-pay | Admitting: Nurse Practitioner

## 2017-11-22 NOTE — Progress Notes (Signed)
Subjective:    Patient ID: Mitchell Powell, male    DOB: Dec 10, 1950, 67 y.o.   MRN: [id number] Chief Complaint  Patient presents with  . Follow-up    ultrasound results    HPI  Mitchell Powell is a 67 y.o. male that is following up today following repair of a abdominal aortic aneurysm about 5 years ago.  The repair was done with stent graft.  The patient also has known common femoral and popliteal artery aneurysms that have been followed with noninvasive studies.  The patient states that he is in his usual state of health, with no changes.  The patient has continued with smoking cessation.  Patient denies any fever, chills, nausea, vomiting, diarrhea.  Patient denies any chest pain or shortness of breath.  Patient denies any amaurosis fugax or TIA-like symptoms.  He denies any claudication-like symptoms.  Today the patient underwent an duplex of his endovascular aortic repair.  The maximal diameter of the aorta was 3.11 cm, compared to 3.3 cm obtained on 10/27/2016.  There is no evidence of endoleak.  The patient also underwent bilateral lower extremity arterial duplexes to evaluate his femoral and popliteal aneurysms.  The bilateral common femoral arteries note focal dilations.  The maximal diameter of the right common femoral artery is 1.47, whereas the right is 1.52.  The maximum diameter on the right popliteal artery is 1.07 cm whereas on the left it is 1.06 cm.  There is no significant change compared to previous exam on 10/27/2016.  No evidence of significant arterial obstruction noted bilaterally.  Review of Systems   Review of Systems: Negative Unless Checked Constitutional: [] Weight loss  [] Fever  [] Chills Cardiac: [] Chest pain   ? Atrial Fibrillation  [] Palpitations   [] Shortness of breath when laying flat   [] Shortness of breath with exertion. Vascular:  [] Pain in legs with walking   [] Pain in legs with standing  [] History of DVT   [] Phlebitis   [] Swelling in legs   [] Varicose veins    [] Non-healing ulcers Pulmonary:   [] Uses home oxygen   [] Productive cough   [] Hemoptysis   [] Wheeze  [] COPD   [] Asthma Neurologic:  [] Dizziness   [] Seizures   [] History of stroke   [] History of TIA  [] Aphasia   [] Vissual changes   [] Weakness or numbness in arm   [] Weakness or numbness in leg Musculoskeletal:   [x] Joint swelling   [] Joint pain   [] Low back pain  ? History of Knee Replacement Hematologic:  [] Easy bruising  [] Easy bleeding   [] Hypercoagulable state   [] Anemic Gastrointestinal:  [] Diarrhea   [] Vomiting  [] Gastroesophageal reflux/heartburn   [] Difficulty swallowing. Genitourinary:  [] Chronic kidney disease   [] Difficult urination  [] Anuric   [] Blood in urine Skin:  [] Rashes   [] Ulcers  Psychological:  [] History of anxiety   []  History of major depression  ? Memory Difficulties     Objective:   Physical Exam  BP 138/80 (BP Location: Right Arm)   Pulse 67   Resp 16   Ht 6\' 1"  (1.854 m)   Wt 211 lb (95.7 kg)   BMI 27.84 kg/m   Past Medical History:  Diagnosis Date  . GERD (gastroesophageal reflux disease)   . Hyperlipidemia   . Hypertension      Gen: WD/WN, NAD Head: /AT, No temporalis wasting.  Ear/Nose/Throat: Hearing grossly intact, nares w/o erythema or drainage Eyes: PER, EOMI, sclera nonicteric.  Neck: Supple, no masses.  No JVD.  Pulmonary:  Good air movement, no  use of accessory muscles.  Cardiac: RRR Vascular:  Vessel Right Left  Radial Palpable Palpable  Femoral Palpable Palpable  Popliteal Palpable Palpable  Dorsalis Pedis Palpable Palpable  Posterior Tibial Palpable Palpable   Gastrointestinal: soft, non-distended. No guarding/no peritoneal signs.  Musculoskeletal: M/S 5/5 throughout.  No deformity or atrophy.  Neurologic: Pain and light touch intact in extremities.  Symmetrical.  Speech is fluent. Motor exam as listed above. Psychiatric: Judgment intact, Mood & affect appropriate for pt's clinical situation. Dermatologic: No Venous rashes. No  Ulcers Noted.  No changes consistent with cellulitis. Lymph : No Cervical lymphadenopathy, no lichenification or skin changes of chronic lymphedema.   Social History   Socioeconomic History  . Marital status: Married    Spouse name: Not on file  . Number of children: Not on file  . Years of education: Not on file  . Highest education level: Not on file  Occupational History  . Not on file  Social Needs  . Financial resource strain: Not on file  . Food insecurity:    Worry: Not on file    Inability: Not on file  . Transportation needs:    Medical: Not on file    Non-medical: Not on file  Tobacco Use  . Smoking status: Never Smoker  . Smokeless tobacco: Never Used  Substance and Sexual Activity  . Alcohol use: Yes  . Drug use: No  . Sexual activity: Not on file  Lifestyle  . Physical activity:    Days per week: Not on file    Minutes per session: Not on file  . Stress: Not on file  Relationships  . Social connections:    Talks on phone: Not on file    Gets together: Not on file    Attends religious service: Not on file    Active member of club or organization: Not on file    Attends meetings of clubs or organizations: Not on file    Relationship status: Not on file  . Intimate partner violence:    Fear of current or ex partner: Not on file    Emotionally abused: Not on file    Physically abused: Not on file    Forced sexual activity: Not on file  Other Topics Concern  . Not on file  Social History Narrative  . Not on file    Past Surgical History:  Procedure Laterality Date  . ABDOMINAL AORTIC ANEURYSM REPAIR    . TONSILLECTOMY AND ADENOIDECTOMY      Family History  Problem Relation Age of Onset  . Varicose Veins Father   . Heart attack Brother   . Diabetes Brother     Allergies  Allergen Reactions  . Prednisone     Other reaction(s): Other (See Comments) irritiability       Assessment & Plan:   1. AAA (abdominal aortic aneurysm) without  rupture (HCC) Recommend: Patient is status post successful endovascular repair of the AAA.   No further intervention is required at this time.   No endoleak is detected and the aneurysm sac is stable.  The patient will continue antiplatelet therapy as prescribed as well as aggressive management of hyperlipidemia. Exercise is again strongly encouraged.   However, endografts require continued surveillance with ultrasound or CT scan. This is mandatory to detect any changes that allow repressurization of the aneurysm sac.  The patient is informed that this would be asymptomatic.  The patient is reminded that lifelong routine surveillance is a necessity with an endograft.  Patient will continue to follow-up at 12 month intervals with ultrasound of the aorta. - VAS [place] EVAR DUPLEX; Future  2. Popliteal artery aneurysm (HCC) No surgery or intervention at this time.  The patient has an asymptomatic popliteal artery aneurysm that is less than 2.5 cm in maximal diameter.  I have discussed the natural history of popliteal aneurysm and the small risk of thrombosis for aneurysm less than 2.5 cm in size.  However, as these small aneurysms tend to enlarge over time, continued surveillance with ultrasound is mandatory.   I have also discussed optimizing medical management with hypertension and lipid control and the importance of abstinence from tobacco.  The patient is also encouraged to exercise a minimum of 30 minutes 4 times a week.   Should the patient develop new leg pain or signs of peripheral embolization they are instructed to seek medical attention immediately and to alert the physician providing care that they have an aneurysm.  The patient voices their understanding.  - VAS [place] LOWER EXTREMITY ARTERIAL DUPLEX; Future  3. Femoral artery aneurysm, bilateral (HCC) No surgery or intervention required at this time.  His noninvasive studies demonstrate stable femoral artery aneurysms bilaterally.   Tobacco cessation and blood pressure control stressed.  Plan to recheck patient again in 12 months with noninvasive studies.  4. Gastroesophageal reflux disease, esophagitis presence not specified Continue PPI as already ordered, this medication has been reviewed and there are no changes at this time.  Avoidence of caffeine and alcohol  Moderate elevation of the head of the bed    Current Outpatient Medications on File Prior to Visit  Medication Sig Dispense Refill  . aspirin EC 81 MG tablet Take by mouth.    . benazepril (LOTENSIN) 20 MG tablet Take by mouth.    . clonazePAM (KLONOPIN) 0.5 MG tablet     . FLUoxetine (PROZAC) 20 MG capsule Take by mouth.    . fluticasone (FLONASE) 50 MCG/ACT nasal spray Place into the nose.    . loratadine (CLARITIN) 10 MG tablet Take by mouth.    . Omega-3 Fatty Acids (FISH OIL) 1000 MG CPDR Take by mouth.    Marland Kitchen omeprazole (PRILOSEC) 20 MG capsule     . pantoprazole (PROTONIX) 40 MG tablet     . pravastatin (PRAVACHOL) 40 MG tablet Take by mouth.    Marland Kitchen albuterol (PROVENTIL HFA;VENTOLIN HFA) 108 (90 Base) MCG/ACT inhaler Inhale into the lungs.    Marland Kitchen azelastine (ASTELIN) 0.1 % nasal spray Place into the nose.    . montelukast (SINGULAIR) 10 MG tablet Take by mouth.    . oxyCODONE-acetaminophen (ROXICET) 5-325 MG tablet Take 1 tablet by mouth every 6 (six) hours as needed for severe pain. (Patient not taking: Reported on 10/28/2017) 20 tablet 0  . ranitidine (ZANTAC) 150 MG capsule Take 1 capsule (150 mg total) by mouth 2 (two) times daily. (Patient not taking: Reported on 10/28/2017) 60 capsule 0   No current facility-administered medications on file prior to visit.     There are no Patient Instructions on file for this visit. No follow-ups on file.   Kris Hartmann, NP

## 2018-04-26 ENCOUNTER — Telehealth: Payer: Self-pay | Admitting: Gastroenterology

## 2018-04-26 NOTE — Telephone Encounter (Signed)
Pt left vm to schedule an apt  

## 2018-04-27 ENCOUNTER — Other Ambulatory Visit: Payer: Self-pay

## 2018-04-27 NOTE — Telephone Encounter (Signed)
Patient returned call to schedule colonoscopy.

## 2018-04-27 NOTE — Telephone Encounter (Signed)
Patients call has been returned.  He has been scheduled for his colonoscopy with Dr. Allen Norris at [facility] on 05/10/18.  Gastroenterology Pre-Procedure Review  Request Date: 05/10/18 Requesting Physician: Dr. Allen Norris  PATIENT REVIEW QUESTIONS: The patient responded to the following health history questions as indicated:    1. Are you having any GI issues? no 2. Do you have a personal history of Polyps? yes (few years ago) 3. Do you have a family history of Colon Cancer or Polyps? yes (mother:intestinal cancer) 4. Diabetes Mellitus? no 5. Joint replacements in the past 12 months?no 6. Major health problems in the past 3 months?no 7. Any artificial heart valves, MVP, or defibrillator?no    MEDICATIONS & ALLERGIES:    Patient reports the following regarding taking any anticoagulation/antiplatelet therapy:   Plavix, Coumadin, Eliquis, Xarelto, Lovenox, Pradaxa, Brilinta, or Effient? no Aspirin? no   Patient confirms/reports the following allergies:  Allergies  Allergen Reactions  . Prednisone     Other reaction(s): Other (See Comments) irritiability    No orders of the defined types were placed in this encounter.   AUTHORIZATION INFORMATION Primary Insurance: 1D#: Group #:  Secondary Insurance: 1D#: Group #:  SCHEDULE INFORMATION: Date: 05/10/18 Time: Location:[facility]

## 2018-05-10 ENCOUNTER — Other Ambulatory Visit: Payer: Self-pay

## 2018-05-10 ENCOUNTER — Ambulatory Visit: Payer: Medicare HMO | Admitting: Anesthesiology

## 2018-05-10 ENCOUNTER — Encounter: Payer: Self-pay | Admitting: *Deleted

## 2018-05-10 ENCOUNTER — Encounter
Admission: RE | Disposition: A | Discharge status: Home / Self Care | Payer: Self-pay | Source: Home / Self Care | Attending: Gastroenterology

## 2018-05-10 ENCOUNTER — Ambulatory Visit
Admission: RE | Admit: 2018-05-10 | Discharge: 2018-05-10 | Disposition: A | Discharge status: Home / Self Care | Payer: Medicare HMO | Attending: Gastroenterology | Admitting: Gastroenterology

## 2018-05-10 DIAGNOSIS — E039 Hypothyroidism, unspecified: Secondary | ICD-10-CM | POA: Insufficient documentation

## 2018-05-10 DIAGNOSIS — Z79899 Other long term (current) drug therapy: Secondary | ICD-10-CM | POA: Insufficient documentation

## 2018-05-10 DIAGNOSIS — K640 First degree hemorrhoids: Secondary | ICD-10-CM | POA: Diagnosis not present

## 2018-05-10 DIAGNOSIS — Z79890 Hormone replacement therapy (postmenopausal): Secondary | ICD-10-CM | POA: Insufficient documentation

## 2018-05-10 DIAGNOSIS — K635 Polyp of colon: Secondary | ICD-10-CM | POA: Diagnosis not present

## 2018-05-10 DIAGNOSIS — I10 Essential (primary) hypertension: Secondary | ICD-10-CM | POA: Diagnosis not present

## 2018-05-10 DIAGNOSIS — Z860100 Personal history of colon polyps, unspecified: Secondary | ICD-10-CM

## 2018-05-10 DIAGNOSIS — F329 Major depressive disorder, single episode, unspecified: Secondary | ICD-10-CM | POA: Diagnosis not present

## 2018-05-10 DIAGNOSIS — K219 Gastro-esophageal reflux disease without esophagitis: Secondary | ICD-10-CM | POA: Diagnosis not present

## 2018-05-10 DIAGNOSIS — F419 Anxiety disorder, unspecified: Secondary | ICD-10-CM | POA: Diagnosis not present

## 2018-05-10 DIAGNOSIS — E785 Hyperlipidemia, unspecified: Secondary | ICD-10-CM | POA: Diagnosis not present

## 2018-05-10 DIAGNOSIS — Z1211 Encounter for screening for malignant neoplasm of colon: Secondary | ICD-10-CM | POA: Insufficient documentation

## 2018-05-10 DIAGNOSIS — D125 Benign neoplasm of sigmoid colon: Secondary | ICD-10-CM

## 2018-05-10 DIAGNOSIS — Z888 Allergy status to other drugs, medicaments and biological substances status: Secondary | ICD-10-CM | POA: Diagnosis not present

## 2018-05-10 HISTORY — DX: Major depressive disorder, single episode, unspecified: F32.9

## 2018-05-10 HISTORY — PX: COLONOSCOPY WITH PROPOFOL: SHX5780

## 2018-05-10 HISTORY — DX: Depression, unspecified: F32.A

## 2018-05-10 HISTORY — DX: Hypothyroidism, unspecified: E03.9

## 2018-05-10 HISTORY — DX: Anxiety disorder, unspecified: F41.9

## 2018-05-10 SURGERY — COLONOSCOPY WITH PROPOFOL
Anesthesia: General

## 2018-05-10 MED ORDER — SODIUM CHLORIDE 0.9 % IV SOLN
INTRAVENOUS | Status: DC
  Administered 2018-05-10: 1000 mL via INTRAVENOUS

## 2018-05-10 MED ORDER — PROPOFOL 10 MG/ML IV BOLUS
INTRAVENOUS | Status: DC | PRN
  Administered 2018-05-10: 100 mg via INTRAVENOUS

## 2018-05-10 MED ORDER — PROPOFOL 500 MG/50ML IV EMUL
INTRAVENOUS | Status: DC | PRN
  Administered 2018-05-10: 120 ug/kg/min via INTRAVENOUS

## 2018-05-10 NOTE — Anesthesia Preprocedure Evaluation (Addendum)
Anesthesia Evaluation  Patient identified by MRN, date of birth, ID band Patient awake    Reviewed: Allergy & Precautions, NPO status , Patient's Chart, lab work & pertinent test results  History of Anesthesia Complications Negative for: history of anesthetic complications  Airway Mallampati: III  TM Distance: >3 FB Neck ROM: Full    Dental no notable dental hx.    Pulmonary neg pulmonary ROS, neg sleep apnea, neg COPD,    breath sounds clear to auscultation- rhonchi (-) wheezing      Cardiovascular hypertension, Pt. on medications (-) CAD, (-) Past MI, (-) Cardiac Stents and (-) CABG  Rhythm:Regular Rate:Normal - Systolic murmurs and - Diastolic murmurs    Neuro/Psych neg Seizures PSYCHIATRIC DISORDERS Anxiety Depression negative neurological ROS     GI/Hepatic Neg liver ROS, GERD  ,  Endo/Other  neg diabetesHypothyroidism   Renal/GU negative Renal ROS     Musculoskeletal negative musculoskeletal ROS (+)   Abdominal (+) - obese,   Peds  Hematology negative hematology ROS (+)   Anesthesia Other Findings Past Medical History: No date: Anxiety No date: Depression No date: GERD (gastroesophageal reflux disease) No date: Hyperlipidemia No date: Hypertension No date: Hypothyroidism   Reproductive/Obstetrics                             Anesthesia Physical Anesthesia Plan  ASA: II  Anesthesia Plan: General   Post-op Pain Management:    Induction: Intravenous  PONV Risk Score and Plan: 1 and Propofol infusion  Airway Management Planned: Natural Airway  Additional Equipment:   Intra-op Plan:   Post-operative Plan:   Informed Consent: I have reviewed the patients History and Physical, chart, labs and discussed the procedure including the risks, benefits and alternatives for the proposed anesthesia with the patient or authorized representative who has indicated his/her  understanding and acceptance.     Dental advisory given  Plan Discussed with: CRNA and Anesthesiologist  Anesthesia Plan Comments:        Anesthesia Quick Evaluation

## 2018-05-10 NOTE — H&P (Signed)
[name], MD [facility] [address] [place], [place] [address] [phone] Fax : [phone]  Primary Care Physician:  Idelle Crouch, MD Primary Gastroenterologist:  Dr. Allen Norris  Pre-Procedure History & Physical: HPI:  Mitchell Powell is a 68 y.o. male is here for an colonoscopy.   Past Medical History:  Diagnosis Date  . Anxiety   . Depression   . GERD (gastroesophageal reflux disease)   . Hyperlipidemia   . Hypertension   . Hypothyroidism     Past Surgical History:  Procedure Laterality Date  . ABDOMINAL AORTIC ANEURYSM REPAIR    . TONSILLECTOMY AND ADENOIDECTOMY      Prior to Admission medications   Medication Sig Start Date End Date Taking? Authorizing Provider  levothyroxine (SYNTHROID, LEVOTHROID) 50 MCG tablet Take 50 mcg by mouth daily before breakfast.   Yes [provider]  azelastine (ASTELIN) 0.1 % nasal spray Place into the nose. 10/13/16 10/28/17  [provider]  benazepril (LOTENSIN) 20 MG tablet Take by mouth. 03/10/16   [provider]  clonazePAM (KLONOPIN) 0.5 MG tablet  09/15/16   [provider]  FLUoxetine (PROZAC) 20 MG capsule Take by mouth. 03/10/16   [provider]  fluticasone (FLONASE) 50 MCG/ACT nasal spray Place into the nose. 10/13/16   [provider]  loratadine (CLARITIN) 10 MG tablet Take by mouth.    [provider]  Omega-3 Fatty Acids (FISH OIL) 1000 MG CPDR Take by mouth.    [provider]  omeprazole (PRILOSEC) 20 MG capsule  09/20/16   [provider]  pantoprazole (PROTONIX) 40 MG tablet  09/14/17   [provider]  pravastatin (PRAVACHOL) 40 MG tablet Take by mouth. 03/10/16   [provider]    Allergies as of 04/28/2018 - Review Complete 11/22/2017  Allergen Reaction Noted  . Prednisone  12/26/2014    Family History  Problem Relation Age of Onset  . Varicose Veins Father   . Heart attack Brother   . Diabetes  Brother     Social History   Socioeconomic History  . Marital status: Married    Spouse name: Not on file  . Number of children: Not on file  . Years of education: Not on file  . Highest education level: Not on file  Occupational History  . Not on file  Social Needs  . Financial resource strain: Not on file  . Food insecurity:    Worry: Not on file    Inability: Not on file  . Transportation needs:    Medical: Not on file    Non-medical: Not on file  Tobacco Use  . Smoking status: Never Smoker  . Smokeless tobacco: Never Used  Substance and Sexual Activity  . Alcohol use: Yes  . Drug use: No  . Sexual activity: Not on file  Lifestyle  . Physical activity:    Days per week: Not on file    Minutes per session: Not on file  . Stress: Not on file  Relationships  . Social connections:    Talks on phone: Not on file    Gets together: Not on file    Attends religious service: Not on file    Active member of club or organization: Not on file    Attends meetings of clubs or organizations: Not on file    Relationship status: Not on file  . Intimate partner violence:    Fear of current or ex partner: Not on file  Emotionally abused: Not on file    Physically abused: Not on file    Forced sexual activity: Not on file  Other Topics Concern  . Not on file  Social History Narrative  . Not on file    Review of Systems: See HPI, otherwise negative ROS  Physical Exam: BP (!) 158/94   Pulse (!) 55   Temp 98.1 F (36.7 C) (Tympanic)   Resp 18   Ht 6\' 1"  (1.854 m)   Wt 98 kg   SpO2 97%   BMI 28.50 kg/m  General:   Alert,  pleasant and cooperative in NAD Head:  Normocephalic and atraumatic. Neck:  Supple; no masses or thyromegaly. Lungs:  Clear throughout to auscultation.    Heart:  Regular rate and rhythm. Abdomen:  Soft, nontender and nondistended. Normal bowel sounds, without guarding, and without rebound.   Neurologic:  Alert and  oriented x4;  grossly normal  neurologically.  Impression/Plan: [name] is here for an colonoscopy to be performed for history of colon polyps  Risks, benefits, limitations, and alternatives regarding  colonoscopy have been reviewed with the patient.  Questions have been answered.  All parties agreeable.   [name], MD  05/10/2018, 9:27 AM

## 2018-05-10 NOTE — Anesthesia Postprocedure Evaluation (Signed)
Anesthesia Post Note  Patient: Mitchell Powell  Procedure(s) Performed: COLONOSCOPY WITH PROPOFOL (N/A )  Patient location during evaluation: Endoscopy Anesthesia Type: General Level of consciousness: awake and alert and oriented Pain management: pain level controlled Vital Signs Assessment: post-procedure vital signs reviewed and stable Respiratory status: spontaneous breathing, nonlabored ventilation and respiratory function stable Cardiovascular status: blood pressure returned to baseline and stable Postop Assessment: no signs of nausea or vomiting Anesthetic complications: no     Last Vitals:  Vitals:   05/10/18 1010 05/10/18 1020  BP: 128/85 126/83  Pulse: (!) 55 (!) 53  Resp: 18 (!) 22  Temp:    SpO2: 96% 98%    Last Pain:  Vitals:   05/10/18 0950  TempSrc: Tympanic                  

## 2018-05-10 NOTE — Op Note (Signed)
[facility] Patient Name: Mitchell Powell Procedure Date: 05/10/2018 9:30 AM MRN: [id number] Account #: [id number] Date of Birth: 1950-07-12 Admit Type: Outpatient Age: 68 Room: [facility] ENDO ROOM 4 Gender: Male Note Status: Finalized Procedure:            Colonoscopy Indications:          High risk colon cancer surveillance: Personal history                        of colonic polyps Providers:            [name] MD, MD Referring MD:         Leonie Douglas. Doy Hutching, MD (Referring MD) Medicines:            Propofol per Anesthesia Complications:        No immediate complications. Procedure:            Pre-Anesthesia Assessment:                       - Prior to the procedure, a History and Physical was                        performed, and patient medications and allergies were                        reviewed. The patient's tolerance of previous                        anesthesia was also reviewed. The risks and benefits of                        the procedure and the sedation options and risks were                        discussed with the patient. All questions were                        answered, and informed consent was obtained. Prior                        Anticoagulants: The patient has taken no previous                        anticoagulant or antiplatelet agents. ASA Grade                        Assessment: II - A patient with mild systemic disease.                        After reviewing the risks and benefits, the patient was                        deemed in satisfactory condition to undergo the                        procedure.                       After obtaining informed consent, the colonoscope was  passed under direct vision. Throughout the procedure,                        the patient's blood pressure, pulse, and oxygen                        saturations were monitored continuously. The                        Colonoscope  was introduced through the anus and                        advanced to the the cecum, identified by appendiceal                        orifice and ileocecal valve. The colonoscopy was                        performed without difficulty. The patient tolerated the                        procedure well. The quality of the bowel preparation                        was fair. Findings:      The perianal and digital rectal examinations were normal.      A 3 mm polyp was found in the sigmoid colon. The polyp was sessile. The       polyp was removed with a cold biopsy forceps. Resection and retrieval       were complete.      Non-bleeding internal hemorrhoids were found during retroflexion. The       hemorrhoids were Grade I (internal hemorrhoids that do not prolapse). Impression:           - One 3 mm polyp in the sigmoid colon, removed with a                        cold biopsy forceps. Resected and retrieved.                       - Non-bleeding internal hemorrhoids. Recommendation:       - Discharge patient to home.                       - Resume previous diet.                       - Continue present medications.                       - Await pathology results.                       - Repeat colonoscopy in 5 years for surveillance. Procedure Code(s):    --- Professional ---                       [phone], Colonoscopy, flexible; with biopsy, single or                        multiple Diagnosis Code(s):    --- Professional ---  Z86.010, Personal history of colonic polyps                       D12.5, Benign neoplasm of sigmoid colon CPT copyright 2018 American Medical Association. All rights reserved. The codes documented in this report are preliminary and upon coder review may  be revised to meet current compliance requirements. [name] MD, MD 05/10/2018 9:52:55 AM This report has been signed electronically. Number of Addenda: 0 Note Initiated On: 05/10/2018 9:30 AM Scope  Withdrawal Time: 0 hours 6 minutes 14 seconds  Total Procedure Duration: 0 hours 11 minutes 31 seconds       [facility]

## 2018-05-10 NOTE — Anesthesia Post-op Follow-up Note (Signed)
Anesthesia QCDR form completed.        

## 2018-05-10 NOTE — Transfer of Care (Signed)
Immediate Anesthesia Transfer of Care Note  Patient: Mitchell Powell  Procedure(s) Performed: COLONOSCOPY WITH PROPOFOL (N/A )  Patient Location: Endoscopy Unit  Anesthesia Type:General  Level of Consciousness: drowsy and patient cooperative  Airway & Oxygen Therapy: Patient Spontanous Breathing and Patient connected to nasal cannula oxygen  Post-op Assessment: Report given to RN and Post -op Vital signs reviewed and stable  Post vital signs: Reviewed and stable  Last Vitals:  Vitals Value Taken Time  BP 112/71 05/10/2018  9:52 AM  Temp 36.7 C 05/10/2018  9:50 AM  Pulse 54 05/10/2018  9:54 AM  Resp 17 05/10/2018  9:54 AM  SpO2 95 % 05/10/2018  9:54 AM  Vitals shown include unvalidated device data.  Last Pain:  Vitals:   05/10/18 0950  TempSrc: Tympanic         Complications: No apparent anesthesia complications

## 2018-05-11 ENCOUNTER — Encounter: Payer: Self-pay | Admitting: Gastroenterology

## 2018-05-11 LAB — SURGICAL PATHOLOGY

## 2018-05-12 ENCOUNTER — Encounter: Payer: Self-pay | Admitting: Gastroenterology

## 2018-07-01 ENCOUNTER — Other Ambulatory Visit (HOSPITAL_COMMUNITY): Payer: Self-pay | Admitting: Internal Medicine

## 2018-07-01 ENCOUNTER — Other Ambulatory Visit: Payer: Self-pay | Admitting: Internal Medicine

## 2018-07-01 DIAGNOSIS — R519 Headache, unspecified: Secondary | ICD-10-CM

## 2018-07-06 ENCOUNTER — Other Ambulatory Visit: Payer: Self-pay

## 2018-07-06 ENCOUNTER — Ambulatory Visit
Admission: RE | Admit: 2018-07-06 | Discharge: 2018-07-06 | Disposition: A | Discharge status: Home / Self Care | Payer: Medicare HMO | Source: Ambulatory Visit | Attending: Internal Medicine | Admitting: Internal Medicine

## 2018-07-06 DIAGNOSIS — R519 Headache, unspecified: Secondary | ICD-10-CM

## 2018-07-06 DIAGNOSIS — R51 Headache: Secondary | ICD-10-CM | POA: Diagnosis not present

## 2018-07-27 DIAGNOSIS — I10 Essential (primary) hypertension: Secondary | ICD-10-CM | POA: Diagnosis not present

## 2018-07-27 DIAGNOSIS — Z79899 Other long term (current) drug therapy: Secondary | ICD-10-CM | POA: Diagnosis not present

## 2018-07-27 DIAGNOSIS — E039 Hypothyroidism, unspecified: Secondary | ICD-10-CM | POA: Diagnosis not present

## 2018-07-27 DIAGNOSIS — Z9989 Dependence on other enabling machines and devices: Secondary | ICD-10-CM | POA: Diagnosis not present

## 2018-07-27 DIAGNOSIS — E785 Hyperlipidemia, unspecified: Secondary | ICD-10-CM | POA: Diagnosis not present

## 2018-07-27 DIAGNOSIS — G4733 Obstructive sleep apnea (adult) (pediatric): Secondary | ICD-10-CM | POA: Diagnosis not present

## 2018-08-12 DIAGNOSIS — R1032 Left lower quadrant pain: Secondary | ICD-10-CM | POA: Diagnosis not present

## 2018-08-12 DIAGNOSIS — R1031 Right lower quadrant pain: Secondary | ICD-10-CM | POA: Diagnosis not present

## 2018-08-12 DIAGNOSIS — M545 Low back pain: Secondary | ICD-10-CM | POA: Diagnosis not present

## 2018-08-23 DIAGNOSIS — G4733 Obstructive sleep apnea (adult) (pediatric): Secondary | ICD-10-CM | POA: Diagnosis not present

## 2018-09-17 DIAGNOSIS — K1120 Sialoadenitis, unspecified: Secondary | ICD-10-CM | POA: Diagnosis not present

## 2018-10-03 DIAGNOSIS — D485 Neoplasm of uncertain behavior of skin: Secondary | ICD-10-CM | POA: Diagnosis not present

## 2018-10-03 DIAGNOSIS — X32XXXA Exposure to sunlight, initial encounter: Secondary | ICD-10-CM | POA: Diagnosis not present

## 2018-10-03 DIAGNOSIS — L821 Other seborrheic keratosis: Secondary | ICD-10-CM | POA: Diagnosis not present

## 2018-10-03 DIAGNOSIS — Z08 Encounter for follow-up examination after completed treatment for malignant neoplasm: Secondary | ICD-10-CM | POA: Diagnosis not present

## 2018-10-03 DIAGNOSIS — Z85828 Personal history of other malignant neoplasm of skin: Secondary | ICD-10-CM | POA: Diagnosis not present

## 2018-10-03 DIAGNOSIS — L570 Actinic keratosis: Secondary | ICD-10-CM | POA: Diagnosis not present

## 2018-10-03 DIAGNOSIS — D2371 Other benign neoplasm of skin of right lower limb, including hip: Secondary | ICD-10-CM | POA: Diagnosis not present

## 2018-11-03 DIAGNOSIS — G4733 Obstructive sleep apnea (adult) (pediatric): Secondary | ICD-10-CM | POA: Diagnosis not present

## 2018-11-03 DIAGNOSIS — Z79899 Other long term (current) drug therapy: Secondary | ICD-10-CM | POA: Diagnosis not present

## 2018-11-03 DIAGNOSIS — E785 Hyperlipidemia, unspecified: Secondary | ICD-10-CM | POA: Diagnosis not present

## 2018-11-03 DIAGNOSIS — E039 Hypothyroidism, unspecified: Secondary | ICD-10-CM | POA: Diagnosis not present

## 2018-11-10 DIAGNOSIS — Z79899 Other long term (current) drug therapy: Secondary | ICD-10-CM | POA: Diagnosis not present

## 2018-11-10 DIAGNOSIS — Z87891 Personal history of nicotine dependence: Secondary | ICD-10-CM | POA: Diagnosis not present

## 2018-11-10 DIAGNOSIS — G4733 Obstructive sleep apnea (adult) (pediatric): Secondary | ICD-10-CM | POA: Diagnosis not present

## 2018-11-10 DIAGNOSIS — I10 Essential (primary) hypertension: Secondary | ICD-10-CM | POA: Diagnosis not present

## 2018-11-10 DIAGNOSIS — F411 Generalized anxiety disorder: Secondary | ICD-10-CM | POA: Diagnosis not present

## 2018-11-10 DIAGNOSIS — E785 Hyperlipidemia, unspecified: Secondary | ICD-10-CM | POA: Diagnosis not present

## 2018-11-10 DIAGNOSIS — Z125 Encounter for screening for malignant neoplasm of prostate: Secondary | ICD-10-CM | POA: Diagnosis not present

## 2018-11-10 DIAGNOSIS — Z7289 Other problems related to lifestyle: Secondary | ICD-10-CM | POA: Diagnosis not present

## 2018-11-10 DIAGNOSIS — E039 Hypothyroidism, unspecified: Secondary | ICD-10-CM | POA: Diagnosis not present

## 2018-11-22 ENCOUNTER — Ambulatory Visit (INDEPENDENT_AMBULATORY_CARE_PROVIDER_SITE_OTHER): Payer: Medicare HMO

## 2018-11-22 ENCOUNTER — Other Ambulatory Visit: Payer: Self-pay

## 2018-11-22 ENCOUNTER — Ambulatory Visit (INDEPENDENT_AMBULATORY_CARE_PROVIDER_SITE_OTHER): Payer: Medicare HMO | Admitting: Nurse Practitioner

## 2018-11-22 ENCOUNTER — Encounter (INDEPENDENT_AMBULATORY_CARE_PROVIDER_SITE_OTHER): Payer: Self-pay | Admitting: Nurse Practitioner

## 2018-11-22 VITALS — BP 156/88 | HR 51 | Resp 16 | Wt 212.0 lb

## 2018-11-22 DIAGNOSIS — M25569 Pain in unspecified knee: Secondary | ICD-10-CM | POA: Insufficient documentation

## 2018-11-22 DIAGNOSIS — I724 Aneurysm of artery of lower extremity: Secondary | ICD-10-CM

## 2018-11-22 DIAGNOSIS — I714 Abdominal aortic aneurysm, without rupture, unspecified: Secondary | ICD-10-CM

## 2018-11-22 DIAGNOSIS — K219 Gastro-esophageal reflux disease without esophagitis: Secondary | ICD-10-CM | POA: Diagnosis not present

## 2018-11-22 DIAGNOSIS — E782 Mixed hyperlipidemia: Secondary | ICD-10-CM | POA: Diagnosis not present

## 2018-11-22 NOTE — Progress Notes (Signed)
SUBJECTIVE:  Patient ID: Mitchell Powell, male    DOB: 05-20-1950, 68 y.o.   MRN: [id number] Chief Complaint  Patient presents with  . Follow-up    ultrasound follow up    HPI  Mitchell Powell is a 68 y.o. male The patient returns to the office for surveillance of an abdominal aortic aneurysm status post stent graft placement on 11/2011.   Patient denies abdominal pain or back pain, no other abdominal complaints. No groin related complaints. No symptoms consistent with distal embolization No changes in claudication distance.   There have been no interval changes in his overall healthcare since his last visit.   Patient denies amaurosis fugax or TIA symptoms. There is no history of claudication or rest pain symptoms of the lower extremities. The patient denies angina or shortness of breath.   Duplex US of the aorta and iliac arteries shows a 4.1 cm AAA sac with no endoleak, compared to 3.1 in the sac the previous study.  Popliteal and iliac arteries under 2 cm bilaterally.  Past Medical History:  Diagnosis Date  . Anxiety   . Depression   . GERD (gastroesophageal reflux disease)   . Hyperlipidemia   . Hypertension   . Hypothyroidism     Past Surgical History:  Procedure Laterality Date  . ABDOMINAL AORTIC ANEURYSM REPAIR    . COLONOSCOPY WITH PROPOFOL N/A 05/10/2018   Procedure: COLONOSCOPY WITH PROPOFOL;  Surgeon: [name], MD;  Location: [facility] ENDOSCOPY;  Service: Endoscopy;  Laterality: N/A;  . TONSILLECTOMY AND ADENOIDECTOMY      Social History   Socioeconomic History  . Marital status: Married    Spouse name: Not on file  . Number of children: Not on file  . Years of education: Not on file  . Highest education level: Not on file  Occupational History  . Not on file  Social Needs  . Financial resource strain: Not on file  . Food insecurity    Worry: Not on file    Inability: Not on file  . Transportation needs    Medical: Not on file    Non-medical: Not on  file  Tobacco Use  . Smoking status: Never Smoker  . Smokeless tobacco: Never Used  Substance and Sexual Activity  . Alcohol use: Yes  . Drug use: No  . Sexual activity: Not on file  Lifestyle  . Physical activity    Days per week: Not on file    Minutes per session: Not on file  . Stress: Not on file  Relationships  . Social Herbalist on phone: Not on file    Gets together: Not on file    Attends religious service: Not on file    Active member of club or organization: Not on file    Attends meetings of clubs or organizations: Not on file    Relationship status: Not on file  . Intimate partner violence    Fear of current or ex partner: Not on file    Emotionally abused: Not on file    Physically abused: Not on file    Forced sexual activity: Not on file  Other Topics Concern  . Not on file  Social History Narrative  . Not on file    Family History  Problem Relation Age of Onset  . Varicose Veins Father   . Heart attack Brother   . Diabetes Brother     Allergies  Allergen Reactions  . Prednisone  Other reaction(s): Other (See Comments) irritiability     Review of Systems   Review of Systems: Negative Unless Checked Constitutional: [] Weight loss  [] Fever  [] Chills Cardiac: [] Chest pain   []  Atrial Fibrillation  [] Palpitations   [] Shortness of breath when laying flat   [] Shortness of breath with exertion. [] Shortness of breath at rest Vascular:  [] Pain in legs with walking   [] Pain in legs with standing [] Pain in legs when laying flat   [] Claudication    [] Pain in feet when laying flat    [] History of DVT   [] Phlebitis   [] Swelling in legs   [] Varicose veins   [] Non-healing ulcers Pulmonary:   [] Uses home oxygen   [] Productive cough   [] Hemoptysis   [] Wheeze  [x] COPD   [] Asthma Neurologic:  [] Dizziness   [] Seizures  [] Blackouts [] History of stroke   [] History of TIA  [] Aphasia   [] Temporary Blindness   [] Weakness or numbness in arm   [] Weakness or  numbness in leg Musculoskeletal:   [] Joint swelling   [] Joint pain   [x] Low back pain  []  History of Knee Replacement [] Arthritis [] back Surgeries  []  Spinal Stenosis    Hematologic:  [] Easy bruising  [] Easy bleeding   [] Hypercoagulable state   [] Anemic Gastrointestinal:  [] Diarrhea   [] Vomiting  [x] Gastroesophageal reflux/heartburn   [] Difficulty swallowing. [] Abdominal pain Genitourinary:  [] Chronic kidney disease   [] Difficult urination  [] Anuric   [] Blood in urine [] Frequent urination  [] Burning with urination   [] Hematuria Skin:  [] Rashes   [] Ulcers [] Wounds Psychological:  [x] History of anxiety   [x]  History of major depression  []  Memory Difficulties      OBJECTIVE:   Physical Exam  BP (!) 156/88 (BP Location: Right Arm)   Pulse (!) 51   Resp 16   Wt 212 lb (96.2 kg)   BMI 27.97 kg/m   Gen: WD/WN, NAD Head: [place]/AT, No temporalis wasting.  Ear/Nose/Throat: Hearing grossly intact, nares w/o erythema or drainage Eyes: PER, EOMI, sclera nonicteric.  Neck: Supple, no masses.  No JVD.  Pulmonary:  Good air movement, no use of accessory muscles.  Cardiac: RRR Vascular:  Vessel Right Left  Radial Palpable Palpable  Popliteal Not Palpable Not Palpable  Dorsalis Pedis Palpable Palpable  Posterior Tibial Palpable Palpable   Gastrointestinal: soft, non-distended. No guarding/no peritoneal signs.  Musculoskeletal: M/S 5/5 throughout.  No deformity or atrophy.  Neurologic: Pain and light touch intact in extremities.  Symmetrical.  Speech is fluent. Motor exam as listed above. Psychiatric: Judgment intact, Mood & affect appropriate for pt's clinical situation. Dermatologic: No Venous rashes. No Ulcers Noted.  No changes consistent with cellulitis. Lymph : No Cervical lymphadenopathy, no lichenification or skin changes of chronic lymphedema.       ASSESSMENT AND PLAN:  1. AAA (abdominal aortic aneurysm) without rupture [facility]) Patient has successful abdominal aortic aneurysm stent  graft placement on 10/13.  Previous measurement was 3.1cm the patient sac however today it was measured at 4.1.  In both instances no evidence of endoleak was seen.  In this instance we will have the patient return in 6 months for repeat studies.  If no evidence of growth is seen as well as no endoleak we will continue to follow on an annual basis. - VAS [place] EVAR DUPLEX; Future  2. Popliteal artery aneurysm (HCC) No change washer, will continue to monitor on an annual basis.  Patient advised to contact our office if he sees any signs and symptoms of distal embolization such as sudden  color changes in terms, sudden pain of the lower extremity.  3. Femoral artery aneurysm, bilateral (HCC) No change from last year, will continue to monitor on an annual basis.  4. Mixed hyperlipidemia Continue statin as ordered and reviewed, no changes at this time   5. Gastroesophageal reflux disease, esophagitis presence not specified Continue PPI as already ordered, this medication has been reviewed and there are no changes at this time.  Avoidence of caffeine and alcohol  Moderate elevation of the head of the bed    Current Outpatient Medications on File Prior to Visit  Medication Sig Dispense Refill  . benazepril (LOTENSIN) 20 MG tablet Take by mouth.    . clonazePAM (KLONOPIN) 0.5 MG tablet     . FLUoxetine (PROZAC) 20 MG capsule Take by mouth.    . fluticasone (FLONASE) 50 MCG/ACT nasal spray Place into the nose.    . levothyroxine (SYNTHROID, LEVOTHROID) 50 MCG tablet Take 50 mcg by mouth daily before breakfast.    . loratadine (CLARITIN) 10 MG tablet Take by mouth.    . Omega-3 Fatty Acids (FISH OIL) 1000 MG CPDR Take by mouth.    Marland Kitchen omeprazole (PRILOSEC) 20 MG capsule     . pantoprazole (PROTONIX) 40 MG tablet     . pravastatin (PRAVACHOL) 40 MG tablet Take by mouth.    Marland Kitchen azelastine (ASTELIN) 0.1 % nasal spray Place into the nose.     No current facility-administered medications on file prior  to visit.     There are no Patient Instructions on file for this visit. No follow-ups on file.   Kris Hartmann, NP  This note was completed with Sales executive.  Any errors are purely unintentional.

## 2018-11-30 DIAGNOSIS — I10 Essential (primary) hypertension: Secondary | ICD-10-CM | POA: Diagnosis not present

## 2018-11-30 DIAGNOSIS — R001 Bradycardia, unspecified: Secondary | ICD-10-CM | POA: Diagnosis not present

## 2018-11-30 DIAGNOSIS — E785 Hyperlipidemia, unspecified: Secondary | ICD-10-CM | POA: Diagnosis not present

## 2018-12-07 DIAGNOSIS — R1032 Left lower quadrant pain: Secondary | ICD-10-CM | POA: Diagnosis not present

## 2018-12-07 DIAGNOSIS — Z87891 Personal history of nicotine dependence: Secondary | ICD-10-CM | POA: Diagnosis not present

## 2018-12-07 DIAGNOSIS — M5489 Other dorsalgia: Secondary | ICD-10-CM | POA: Diagnosis not present

## 2018-12-09 ENCOUNTER — Other Ambulatory Visit: Payer: Self-pay | Admitting: Internal Medicine

## 2018-12-09 DIAGNOSIS — R1032 Left lower quadrant pain: Secondary | ICD-10-CM

## 2018-12-13 DIAGNOSIS — M5136 Other intervertebral disc degeneration, lumbar region: Secondary | ICD-10-CM | POA: Diagnosis not present

## 2018-12-13 DIAGNOSIS — M4726 Other spondylosis with radiculopathy, lumbar region: Secondary | ICD-10-CM | POA: Diagnosis not present

## 2018-12-13 DIAGNOSIS — M47816 Spondylosis without myelopathy or radiculopathy, lumbar region: Secondary | ICD-10-CM | POA: Diagnosis not present

## 2018-12-16 ENCOUNTER — Ambulatory Visit
Admission: RE | Admit: 2018-12-16 | Discharge: 2018-12-16 | Disposition: A | Discharge status: Home / Self Care | Payer: Medicare HMO | Source: Ambulatory Visit | Attending: Internal Medicine | Admitting: Internal Medicine

## 2018-12-16 ENCOUNTER — Other Ambulatory Visit: Payer: Self-pay

## 2018-12-16 DIAGNOSIS — R1032 Left lower quadrant pain: Secondary | ICD-10-CM | POA: Diagnosis not present

## 2018-12-16 LAB — POCT I-STAT CREATININE: Creatinine, Ser: 0.9 mg/dL (ref 0.61–1.24)

## 2018-12-16 MED ORDER — IOHEXOL 300 MG/ML  SOLN
100.0000 mL | Freq: Once | INTRAMUSCULAR | Status: AC | PRN
  Administered 2018-12-16: 100 mL via INTRAVENOUS

## 2018-12-21 DIAGNOSIS — M25651 Stiffness of right hip, not elsewhere classified: Secondary | ICD-10-CM | POA: Diagnosis not present

## 2018-12-21 DIAGNOSIS — M545 Low back pain: Secondary | ICD-10-CM | POA: Diagnosis not present

## 2018-12-21 DIAGNOSIS — M6281 Muscle weakness (generalized): Secondary | ICD-10-CM | POA: Diagnosis not present

## 2018-12-21 DIAGNOSIS — M25652 Stiffness of left hip, not elsewhere classified: Secondary | ICD-10-CM | POA: Diagnosis not present

## 2018-12-23 DIAGNOSIS — M6281 Muscle weakness (generalized): Secondary | ICD-10-CM | POA: Diagnosis not present

## 2018-12-23 DIAGNOSIS — M25651 Stiffness of right hip, not elsewhere classified: Secondary | ICD-10-CM | POA: Diagnosis not present

## 2018-12-23 DIAGNOSIS — M25652 Stiffness of left hip, not elsewhere classified: Secondary | ICD-10-CM | POA: Diagnosis not present

## 2018-12-23 DIAGNOSIS — M545 Low back pain: Secondary | ICD-10-CM | POA: Diagnosis not present

## 2018-12-28 DIAGNOSIS — M6281 Muscle weakness (generalized): Secondary | ICD-10-CM | POA: Diagnosis not present

## 2018-12-28 DIAGNOSIS — M545 Low back pain: Secondary | ICD-10-CM | POA: Diagnosis not present

## 2018-12-28 DIAGNOSIS — M25652 Stiffness of left hip, not elsewhere classified: Secondary | ICD-10-CM | POA: Diagnosis not present

## 2018-12-28 DIAGNOSIS — M25651 Stiffness of right hip, not elsewhere classified: Secondary | ICD-10-CM | POA: Diagnosis not present

## 2018-12-30 DIAGNOSIS — M6281 Muscle weakness (generalized): Secondary | ICD-10-CM | POA: Diagnosis not present

## 2018-12-30 DIAGNOSIS — M545 Low back pain: Secondary | ICD-10-CM | POA: Diagnosis not present

## 2018-12-30 DIAGNOSIS — M25651 Stiffness of right hip, not elsewhere classified: Secondary | ICD-10-CM | POA: Diagnosis not present

## 2018-12-30 DIAGNOSIS — M25652 Stiffness of left hip, not elsewhere classified: Secondary | ICD-10-CM | POA: Diagnosis not present

## 2019-01-02 DIAGNOSIS — M25652 Stiffness of left hip, not elsewhere classified: Secondary | ICD-10-CM | POA: Diagnosis not present

## 2019-01-02 DIAGNOSIS — M6281 Muscle weakness (generalized): Secondary | ICD-10-CM | POA: Diagnosis not present

## 2019-01-02 DIAGNOSIS — M25651 Stiffness of right hip, not elsewhere classified: Secondary | ICD-10-CM | POA: Diagnosis not present

## 2019-01-04 DIAGNOSIS — M25651 Stiffness of right hip, not elsewhere classified: Secondary | ICD-10-CM | POA: Diagnosis not present

## 2019-01-04 DIAGNOSIS — M6281 Muscle weakness (generalized): Secondary | ICD-10-CM | POA: Diagnosis not present

## 2019-01-04 DIAGNOSIS — M25652 Stiffness of left hip, not elsewhere classified: Secondary | ICD-10-CM | POA: Diagnosis not present

## 2019-01-11 DIAGNOSIS — M25652 Stiffness of left hip, not elsewhere classified: Secondary | ICD-10-CM | POA: Diagnosis not present

## 2019-01-11 DIAGNOSIS — M25651 Stiffness of right hip, not elsewhere classified: Secondary | ICD-10-CM | POA: Diagnosis not present

## 2019-01-11 DIAGNOSIS — M6281 Muscle weakness (generalized): Secondary | ICD-10-CM | POA: Diagnosis not present

## 2019-01-13 DIAGNOSIS — M25651 Stiffness of right hip, not elsewhere classified: Secondary | ICD-10-CM | POA: Diagnosis not present

## 2019-01-13 DIAGNOSIS — M25652 Stiffness of left hip, not elsewhere classified: Secondary | ICD-10-CM | POA: Diagnosis not present

## 2019-01-13 DIAGNOSIS — M6281 Muscle weakness (generalized): Secondary | ICD-10-CM | POA: Diagnosis not present

## 2019-02-02 DIAGNOSIS — G4733 Obstructive sleep apnea (adult) (pediatric): Secondary | ICD-10-CM | POA: Diagnosis not present

## 2019-04-10 DIAGNOSIS — D2261 Melanocytic nevi of right upper limb, including shoulder: Secondary | ICD-10-CM | POA: Diagnosis not present

## 2019-04-10 DIAGNOSIS — D485 Neoplasm of uncertain behavior of skin: Secondary | ICD-10-CM | POA: Diagnosis not present

## 2019-04-10 DIAGNOSIS — Z85828 Personal history of other malignant neoplasm of skin: Secondary | ICD-10-CM | POA: Diagnosis not present

## 2019-04-10 DIAGNOSIS — D2262 Melanocytic nevi of left upper limb, including shoulder: Secondary | ICD-10-CM | POA: Diagnosis not present

## 2019-04-10 DIAGNOSIS — D2371 Other benign neoplasm of skin of right lower limb, including hip: Secondary | ICD-10-CM | POA: Diagnosis not present

## 2019-04-10 DIAGNOSIS — D2272 Melanocytic nevi of left lower limb, including hip: Secondary | ICD-10-CM | POA: Diagnosis not present

## 2019-04-10 DIAGNOSIS — L821 Other seborrheic keratosis: Secondary | ICD-10-CM | POA: Diagnosis not present

## 2019-04-17 DIAGNOSIS — H2513 Age-related nuclear cataract, bilateral: Secondary | ICD-10-CM | POA: Diagnosis not present

## 2019-04-17 DIAGNOSIS — H524 Presbyopia: Secondary | ICD-10-CM | POA: Diagnosis not present

## 2019-04-19 DIAGNOSIS — H524 Presbyopia: Secondary | ICD-10-CM | POA: Diagnosis not present

## 2019-04-19 DIAGNOSIS — H5213 Myopia, bilateral: Secondary | ICD-10-CM | POA: Diagnosis not present

## 2019-04-19 DIAGNOSIS — H52209 Unspecified astigmatism, unspecified eye: Secondary | ICD-10-CM | POA: Diagnosis not present

## 2019-05-03 DIAGNOSIS — Z79899 Other long term (current) drug therapy: Secondary | ICD-10-CM | POA: Diagnosis not present

## 2019-05-03 DIAGNOSIS — E785 Hyperlipidemia, unspecified: Secondary | ICD-10-CM | POA: Diagnosis not present

## 2019-05-03 DIAGNOSIS — Z125 Encounter for screening for malignant neoplasm of prostate: Secondary | ICD-10-CM | POA: Diagnosis not present

## 2019-05-03 DIAGNOSIS — E039 Hypothyroidism, unspecified: Secondary | ICD-10-CM | POA: Diagnosis not present

## 2019-05-03 DIAGNOSIS — I10 Essential (primary) hypertension: Secondary | ICD-10-CM | POA: Diagnosis not present

## 2019-05-10 DIAGNOSIS — F329 Major depressive disorder, single episode, unspecified: Secondary | ICD-10-CM | POA: Diagnosis not present

## 2019-05-10 DIAGNOSIS — E079 Disorder of thyroid, unspecified: Secondary | ICD-10-CM | POA: Diagnosis not present

## 2019-05-10 DIAGNOSIS — Z9989 Dependence on other enabling machines and devices: Secondary | ICD-10-CM | POA: Diagnosis not present

## 2019-05-10 DIAGNOSIS — Z79899 Other long term (current) drug therapy: Secondary | ICD-10-CM | POA: Diagnosis not present

## 2019-05-10 DIAGNOSIS — I10 Essential (primary) hypertension: Secondary | ICD-10-CM | POA: Diagnosis not present

## 2019-05-10 DIAGNOSIS — G4733 Obstructive sleep apnea (adult) (pediatric): Secondary | ICD-10-CM | POA: Diagnosis not present

## 2019-05-10 DIAGNOSIS — F419 Anxiety disorder, unspecified: Secondary | ICD-10-CM | POA: Diagnosis not present

## 2019-05-10 DIAGNOSIS — E785 Hyperlipidemia, unspecified: Secondary | ICD-10-CM | POA: Diagnosis not present

## 2019-05-10 DIAGNOSIS — Z955 Presence of coronary angioplasty implant and graft: Secondary | ICD-10-CM | POA: Insufficient documentation

## 2019-05-10 DIAGNOSIS — Z0000 Encounter for general adult medical examination without abnormal findings: Secondary | ICD-10-CM | POA: Diagnosis not present

## 2019-05-12 DIAGNOSIS — M545 Low back pain: Secondary | ICD-10-CM | POA: Diagnosis not present

## 2019-05-23 ENCOUNTER — Ambulatory Visit (INDEPENDENT_AMBULATORY_CARE_PROVIDER_SITE_OTHER): Payer: Medicare HMO

## 2019-05-23 ENCOUNTER — Ambulatory Visit (INDEPENDENT_AMBULATORY_CARE_PROVIDER_SITE_OTHER): Payer: Medicare HMO | Admitting: Vascular Surgery

## 2019-05-23 ENCOUNTER — Encounter (INDEPENDENT_AMBULATORY_CARE_PROVIDER_SITE_OTHER): Payer: Self-pay | Admitting: Vascular Surgery

## 2019-05-23 ENCOUNTER — Other Ambulatory Visit: Payer: Self-pay

## 2019-05-23 VITALS — BP 137/79 | HR 55 | Resp 14 | Ht 72.0 in | Wt 212.0 lb

## 2019-05-23 DIAGNOSIS — E782 Mixed hyperlipidemia: Secondary | ICD-10-CM | POA: Diagnosis not present

## 2019-05-23 DIAGNOSIS — I724 Aneurysm of artery of lower extremity: Secondary | ICD-10-CM

## 2019-05-23 DIAGNOSIS — I10 Essential (primary) hypertension: Secondary | ICD-10-CM

## 2019-05-23 DIAGNOSIS — I714 Abdominal aortic aneurysm, without rupture, unspecified: Secondary | ICD-10-CM

## 2019-05-23 NOTE — Assessment & Plan Note (Addendum)
Over seven years s/p repair.  We do not see an obvious endoleak, but he has had increased sac size on the last 2 visits which is a little more concerning.  This now measures 4.4 cm in maximal diameter and was previously 4.2 cm at last check.  Prior to that, it had been less than 4 cm.  We are going to continue to follow this on 46-month intervals and we will also need to check his other aneurysmal diseases at his follow-up visit.  Blood pressure control and tobacco avoidance are of paramount importance.

## 2019-05-23 NOTE — Assessment & Plan Note (Signed)
Same risk factors for growth of the femoral aneurysms as would be for the popliteal lower abdominal aortic aneurysms.  Avoid tobacco.  Blood pressure control is of paramount importance.  Plan to check this in 6 months.

## 2019-05-23 NOTE — Assessment & Plan Note (Signed)
Blood pressure control and tobacco avoidance of significant importance.  Plan to check with lower extremity arterial duplex in 6 months.

## 2019-05-23 NOTE — Patient Instructions (Signed)
Abdominal Aortic Aneurysm  An aneurysm is a bulge in one of the blood vessels that carry blood away from the heart (artery). It happens when blood pushes up against a weak or damaged place in the wall of an artery. An abdominal aortic aneurysm happens in the main artery of the body (aorta). Some aneurysms may not cause problems. If it grows, it can burst or tear, causing bleeding inside the body. This is an emergency. It needs to be treated right away. What are the causes? The exact cause of this condition is not known. What increases the risk? The following may make you more likely to get this condition:  Being a male who is 60 years of age or older.  Being white (Caucasian).  Using tobacco.  Having a family history of aneurysms.  Having the following conditions: ? Hardening of the arteries (arteriosclerosis). ? Inflammation of the walls of an artery (arteritis). ? Certain genetic conditions. ? Being very overweight (obesity). ? An infection in the wall of the aorta (infectious aortitis). ? High cholesterol. ? High blood pressure (hypertension). What are the signs or symptoms? Symptoms depend on the size of the aneurysm and how fast it is growing. Most grow slowly and do not cause any symptoms. If symptoms do occur, they may include:  Pain in the belly (abdomen), side, or back.  Feeling full after eating only small amounts of food.  Feeling a throbbing lump in the belly. Symptoms that the aneurysm has burst (ruptured) include:  Sudden, very bad pain in the belly, side, or back.  Feeling sick to your stomach (nauseous).  Throwing up (vomiting).  Feeling light-headed or passing out. How is this treated? Treatment for this condition depends on:  The size of the aneurysm.  How fast it is growing.  Your age.  Your risk of having it burst. If your aneurysm is smaller than 2 inches (5 cm), your doctor may manage it by:  Checking it often to see if it is getting bigger.  You may have an imaging test (ultrasound) to check it every 3-6 months, every year, or every few years.  Giving you medicines to: ? Control blood pressure. ? Treat pain. ? Fight infection. If your aneurysm is larger than 2 inches (5 cm), you may need surgery to fix it. Follow these instructions at home: Lifestyle  Do not use any products that have nicotine or tobacco in them. This includes cigarettes, e-cigarettes, and chewing tobacco. If you need help quitting, ask your doctor.  Get regular exercise. Ask your doctor what types of exercise are best for you. Eating and drinking  Eat a heart-healthy diet. This includes eating plenty of: ? Fresh fruits and vegetables. ? Whole grains. ? Low-fat (lean) protein. ? Low-fat dairy products.  Avoid foods that are high in saturated fat and cholesterol. These foods include red meat and some dairy products.  Do not drink alcohol if: ? Your doctor tells you not to drink. ? You are pregnant, may be pregnant, or are planning to become pregnant.  If you drink alcohol: ? Limit how much you use to:  0-1 drink a day for women.  0-2 drinks a day for men. ? Be aware of how much alcohol is in your drink. In the [place] one drink equals any of these:  One typical bottle of beer (12 oz).  One-half glass of wine (5 oz).  One shot of hard liquor (1 oz). General instructions  Take over-the-counter and prescription medicines only as   told by your doctor.  Keep your blood pressure within normal limits. Ask your doctor what your blood pressure should be.  Have your blood sugar (glucose) level and cholesterol levels checked regularly. Keep your blood sugar level and cholesterol levels within normal limits.  Avoid heavy lifting and activities that take a lot of effort. Ask your doctor what activities are safe for you.  Keep all follow-up visits as told by your doctor. This is important. ? Talk to your doctor about regular screenings to see if the  aneurysm is getting bigger. Contact a doctor if you:  Have pain in your belly, side, or back.  Have a throbbing feeling in your belly.  Have a family history of aneurysms. Get help right away if you:  Have sudden, bad pain in your belly, side, or back.  Feel sick to your stomach.  Throw up.  Have trouble pooping (constipation).  Have trouble peeing (urinating).  Feel light-headed.  Have a fast heart rate when you stand.  Have sweaty skin that is cold to the touch (clammy).  Have shortness of breath.  Have a fever. These symptoms may be an emergency. Do not wait to see if the symptoms will go away. Get medical help right away. Call your local emergency services (911 in the [place] Do not drive yourself to the hospital. Summary  An aneurysm is a bulge in one of the blood vessels that carry blood away from the heart (artery). Some aneurysms may not cause problems.  You may need to have yours checked often. If it grows, it can burst or tear. This causes bleeding inside the body. It needs to be treated right away.  Follow instructions from your doctor about healthy lifestyle changes.  Keep all follow-up visits as told by your doctor. This is important. This information is not intended to replace advice given to you by your health care provider. Make sure you discuss any questions you have with your health care provider. Document Revised: 05/30/2018 Document Reviewed: 09/18/2017 Elsevier Patient Education  2020 [employer]  

## 2019-05-23 NOTE — Assessment & Plan Note (Signed)
lipid control important in reducing the progression of atherosclerotic disease. Continue statin therapy  

## 2019-05-23 NOTE — Progress Notes (Signed)
MRN : [id number]  Mitchell Powell is a 69 y.o. (1950/04/27) male who presents with chief complaint of  Chief Complaint  Patient presents with  . Follow-up    6 mo evar  .  History of Present Illness: Patient returns today in follow up of his multiple vascular issues.  He is 50 years status post endovascular abdominal aortic aneurysm repair.  He has done well from this.  For several years, his aneurysm sac had decreased and was below 4 cm in maximal diameter with no endoleak.  At last visit, he had aneurysm sac growth up to 4.2 cm.  Today on duplex, he does have continued increase in the sac diameter now measuring 4.4 cm in maximal diameter.  We do not see an obvious endoleak on duplex. He also has femoral and popliteal aneurysms.  He says he has no current aneurysm related symptoms. Specifically, the patient denies new back or abdominal pain, or signs of peripheral embolization.  He is also not having significant claudication or rest pain symptoms.  No lower extremity ulcerations.  Current Outpatient Medications  Medication Sig Dispense Refill  . azelastine (ASTELIN) 0.1 % nasal spray Place into the nose.    . benazepril (LOTENSIN) 20 MG tablet Take by mouth.    . clonazePAM (KLONOPIN) 0.5 MG tablet     . FLUoxetine (PROZAC) 20 MG capsule Take by mouth.    . fluticasone (FLONASE) 50 MCG/ACT nasal spray Place into the nose.    . levothyroxine (SYNTHROID, LEVOTHROID) 50 MCG tablet Take 50 mcg by mouth daily before breakfast.    . loratadine (CLARITIN) 10 MG tablet Take by mouth.    . Omega-3 Fatty Acids (FISH OIL) 1000 MG CPDR Take by mouth.    Marland Kitchen omeprazole (PRILOSEC) 20 MG capsule     . pantoprazole (PROTONIX) 40 MG tablet     . pravastatin (PRAVACHOL) 40 MG tablet Take by mouth.     No current facility-administered medications for this visit.    Past Medical History:  Diagnosis Date  . Anxiety   . Depression   . GERD (gastroesophageal reflux disease)   . Hyperlipidemia   .  Hypertension   . Hypothyroidism     Past Surgical History:  Procedure Laterality Date  . ABDOMINAL AORTIC ANEURYSM REPAIR    . COLONOSCOPY WITH PROPOFOL N/A 05/10/2018   Procedure: COLONOSCOPY WITH PROPOFOL;  Surgeon: [name], MD;  Location: [facility] ENDOSCOPY;  Service: Endoscopy;  Laterality: N/A;  . TONSILLECTOMY AND ADENOIDECTOMY       Social History   Tobacco Use  . Smoking status: Never Smoker  . Smokeless tobacco: Never Used  Substance Use Topics  . Alcohol use: Yes  . Drug use: No    Family History  Problem Relation Age of Onset  . Varicose Veins Father   . Heart attack Brother   . Diabetes Brother      Allergies  Allergen Reactions  . Prednisone     Other reaction(s): Other (See Comments) irritiability     REVIEW OF SYSTEMS (Negative unless checked)  Constitutional: [] Weight loss  [] Fever  [] Chills Cardiac: [] Chest pain   [] Chest pressure   [] Palpitations   [] Shortness of breath when laying flat   [] Shortness of breath at rest   [] Shortness of breath with exertion. Vascular:  [] Pain in legs with walking   [] Pain in legs at rest   [] Pain in legs when laying flat   [] Claudication   [] Pain in feet when walking  []   Pain in feet at rest  [] Pain in feet when laying flat   [] History of DVT   [] Phlebitis   [] Swelling in legs   [] Varicose veins   [] Non-healing ulcers Pulmonary:   [] Uses home oxygen   [] Productive cough   [] Hemoptysis   [] Wheeze  [] COPD   [] Asthma Neurologic:  [] Dizziness  [] Blackouts   [] Seizures   [] History of stroke   [] History of TIA  [] Aphasia   [] Temporary blindness   [] Dysphagia   [] Weakness or numbness in arms   [] Weakness or numbness in legs Musculoskeletal:  [x] Arthritis   [] Joint swelling   [] Joint pain   [] Low back pain Hematologic:  [] Easy bruising  [] Easy bleeding   [] Hypercoagulable state   [] Anemic   Gastrointestinal:  [] Blood in stool   [] Vomiting blood  [x] Gastroesophageal reflux/heartburn   [] Abdominal pain Genitourinary:  [] Chronic  kidney disease   [] Difficult urination  [] Frequent urination  [] Burning with urination   [] Hematuria Skin:  [] Rashes   [] Ulcers   [] Wounds Psychological:  [x] History of anxiety   [x]  History of major depression.  Physical Examination  BP 137/79   Pulse (!) 55   Resp 14   Ht 6' (1.829 m)   Wt 212 lb (96.2 kg)   BMI 28.75 kg/m  Gen:  WD/WN, NAD Head: [place]/AT, No temporalis wasting. Ear/Nose/Throat: Hearing grossly intact, nares w/o erythema or drainage Eyes: Conjunctiva clear. Sclera non-icteric Neck: Supple.  Trachea midline Pulmonary:  Good air movement, no use of accessory muscles.  Cardiac: RRR, no JVD Vascular:  Vessel Right Left  Radial Palpable Palpable                  Fem  enlarged  enlarged  Pop  enlarged  enlarged  PT Palpable Palpable  DP Palpable Palpable   Gastrointestinal: soft, non-tender/non-distended. No increased aortic impulse  Musculoskeletal: M/S 5/5 throughout.  No deformity or atrophy.  No significant lower extremity edema. Neurologic: Sensation grossly intact in extremities.  Symmetrical.  Speech is fluent.  Psychiatric: Judgment intact, Mood & affect appropriate for pt's clinical situation. Dermatologic: No rashes or ulcers noted.  No cellulitis or open wounds.       Labs No results found for this or any previous visit (from the past 2160 hour(s)).  Radiology No results found.  Assessment/Plan  AAA (abdominal aortic aneurysm) without rupture (HCC) Over seven years s/p repair.  We do not see an obvious endoleak, but he has had increased sac size on the last 2 visits which is a little more concerning.  This now measures 4.4 cm in maximal diameter and was previously 4.2 cm at last check.  Prior to that, it had been less than 4 cm.  We are going to continue to follow this on 43-month intervals and we will also need to check his other aneurysmal diseases at his follow-up visit.  Blood pressure control and tobacco avoidance are of paramount  importance.  Hypertension blood pressure control important in reducing the progression of atherosclerotic disease and in his case particularly aneurysmal growth. On appropriate oral medications.   Hyperlipidemia lipid control important in reducing the progression of atherosclerotic disease. Continue statin therapy   Femoral artery aneurysm, bilateral (HCC) Same risk factors for growth of the femoral aneurysms as would be for the popliteal lower abdominal aortic aneurysms.  Avoid tobacco.  Blood pressure control is of paramount importance.  Plan to check this in 6 months.  Popliteal artery aneurysm (HCC) Blood pressure control and tobacco avoidance of significant importance.  Plan to check with lower extremity arterial duplex in 6 months.    Leotis Pain, MD  05/23/2019 10:12 AM    This note was created with Dragon medical transcription system.  Any errors from dictation are purely unintentional

## 2019-05-23 NOTE — Assessment & Plan Note (Signed)
blood pressure control important in reducing the progression of atherosclerotic disease and in his case particularly aneurysmal growth. On appropriate oral medications.

## 2019-05-31 DIAGNOSIS — E785 Hyperlipidemia, unspecified: Secondary | ICD-10-CM | POA: Diagnosis not present

## 2019-05-31 DIAGNOSIS — R0602 Shortness of breath: Secondary | ICD-10-CM | POA: Diagnosis not present

## 2019-05-31 DIAGNOSIS — G4733 Obstructive sleep apnea (adult) (pediatric): Secondary | ICD-10-CM | POA: Diagnosis not present

## 2019-05-31 DIAGNOSIS — R001 Bradycardia, unspecified: Secondary | ICD-10-CM | POA: Diagnosis not present

## 2019-05-31 DIAGNOSIS — J449 Chronic obstructive pulmonary disease, unspecified: Secondary | ICD-10-CM | POA: Diagnosis not present

## 2019-05-31 DIAGNOSIS — I10 Essential (primary) hypertension: Secondary | ICD-10-CM | POA: Diagnosis not present

## 2019-05-31 DIAGNOSIS — R079 Chest pain, unspecified: Secondary | ICD-10-CM | POA: Diagnosis not present

## 2019-06-16 DIAGNOSIS — H16142 Punctate keratitis, left eye: Secondary | ICD-10-CM | POA: Diagnosis not present

## 2019-06-23 DIAGNOSIS — H16142 Punctate keratitis, left eye: Secondary | ICD-10-CM | POA: Diagnosis not present

## 2019-07-04 DIAGNOSIS — G4733 Obstructive sleep apnea (adult) (pediatric): Secondary | ICD-10-CM | POA: Diagnosis not present

## 2019-08-23 DIAGNOSIS — R1032 Left lower quadrant pain: Secondary | ICD-10-CM | POA: Diagnosis not present

## 2019-08-23 DIAGNOSIS — S3092XD Unspecified superficial injury of abdominal wall, subsequent encounter: Secondary | ICD-10-CM | POA: Diagnosis not present

## 2019-10-13 DIAGNOSIS — H65112 Acute and subacute allergic otitis media (mucoid) (sanguinous) (serous), left ear: Secondary | ICD-10-CM | POA: Diagnosis not present

## 2019-10-13 DIAGNOSIS — R519 Headache, unspecified: Secondary | ICD-10-CM | POA: Diagnosis not present

## 2019-10-17 DIAGNOSIS — Z03818 Encounter for observation for suspected exposure to other biological agents ruled out: Secondary | ICD-10-CM | POA: Diagnosis not present

## 2019-10-17 DIAGNOSIS — G4733 Obstructive sleep apnea (adult) (pediatric): Secondary | ICD-10-CM | POA: Diagnosis not present

## 2019-10-17 DIAGNOSIS — Z1152 Encounter for screening for COVID-19: Secondary | ICD-10-CM | POA: Diagnosis not present

## 2019-10-19 DIAGNOSIS — H903 Sensorineural hearing loss, bilateral: Secondary | ICD-10-CM | POA: Diagnosis not present

## 2019-10-19 DIAGNOSIS — J301 Allergic rhinitis due to pollen: Secondary | ICD-10-CM | POA: Diagnosis not present

## 2019-10-19 DIAGNOSIS — J0100 Acute maxillary sinusitis, unspecified: Secondary | ICD-10-CM | POA: Diagnosis not present

## 2019-11-03 DIAGNOSIS — Z79899 Other long term (current) drug therapy: Secondary | ICD-10-CM | POA: Diagnosis not present

## 2019-11-03 DIAGNOSIS — E785 Hyperlipidemia, unspecified: Secondary | ICD-10-CM | POA: Diagnosis not present

## 2019-11-03 DIAGNOSIS — E039 Hypothyroidism, unspecified: Secondary | ICD-10-CM | POA: Diagnosis not present

## 2019-11-10 DIAGNOSIS — E785 Hyperlipidemia, unspecified: Secondary | ICD-10-CM | POA: Diagnosis not present

## 2019-11-10 DIAGNOSIS — E039 Hypothyroidism, unspecified: Secondary | ICD-10-CM | POA: Diagnosis not present

## 2019-11-10 DIAGNOSIS — Z125 Encounter for screening for malignant neoplasm of prostate: Secondary | ICD-10-CM | POA: Diagnosis not present

## 2019-11-10 DIAGNOSIS — I10 Essential (primary) hypertension: Secondary | ICD-10-CM | POA: Diagnosis not present

## 2019-11-10 DIAGNOSIS — G4733 Obstructive sleep apnea (adult) (pediatric): Secondary | ICD-10-CM | POA: Diagnosis not present

## 2019-11-10 DIAGNOSIS — Z79899 Other long term (current) drug therapy: Secondary | ICD-10-CM | POA: Diagnosis not present

## 2019-11-14 DIAGNOSIS — D2372 Other benign neoplasm of skin of left lower limb, including hip: Secondary | ICD-10-CM | POA: Diagnosis not present

## 2019-11-14 DIAGNOSIS — M79672 Pain in left foot: Secondary | ICD-10-CM | POA: Diagnosis not present

## 2019-11-23 DIAGNOSIS — M25511 Pain in right shoulder: Secondary | ICD-10-CM | POA: Diagnosis not present

## 2019-11-28 ENCOUNTER — Ambulatory Visit (INDEPENDENT_AMBULATORY_CARE_PROVIDER_SITE_OTHER): Payer: Medicare HMO

## 2019-11-28 ENCOUNTER — Other Ambulatory Visit: Payer: Self-pay

## 2019-11-28 ENCOUNTER — Encounter (INDEPENDENT_AMBULATORY_CARE_PROVIDER_SITE_OTHER): Payer: Self-pay | Admitting: Vascular Surgery

## 2019-11-28 ENCOUNTER — Ambulatory Visit (INDEPENDENT_AMBULATORY_CARE_PROVIDER_SITE_OTHER): Payer: Medicare HMO | Admitting: Vascular Surgery

## 2019-11-28 VITALS — BP 133/81 | HR 48 | Ht 72.0 in | Wt 209.0 lb

## 2019-11-28 DIAGNOSIS — I714 Abdominal aortic aneurysm, without rupture, unspecified: Secondary | ICD-10-CM

## 2019-11-28 DIAGNOSIS — E782 Mixed hyperlipidemia: Secondary | ICD-10-CM

## 2019-11-28 DIAGNOSIS — I10 Essential (primary) hypertension: Secondary | ICD-10-CM | POA: Diagnosis not present

## 2019-11-28 DIAGNOSIS — I724 Aneurysm of artery of lower extremity: Secondary | ICD-10-CM | POA: Diagnosis not present

## 2019-11-28 NOTE — Progress Notes (Signed)
MRN : [id number]  Mitchell Powell is a 69 y.o. (12-Apr-1950) male who presents with chief complaint of  Chief Complaint  Patient presents with  . Follow-up    6 mo eval with [facility]   .  History of Present Illness: Patient returns today in follow up of multiple vascular/aneurysm issues.  He is about 7 or 8 years status post endovascular abdominal aortic aneurysm repair.  He has no current abdominal aortic aneurysm symptoms.  Specifically, he does not have back pain or abdominal pain or signs of peripheral embolization.  Duplex today shows a patent stent graft without endoleak and the maximal aortic sac diameter is just slightly larger than 3 cm. He is also followed for small popliteal and femoral artery aneurysms.  No significant leg swelling or pain.  No signs of peripheral embolization. Duplex today demonstrates a right femoral artery aneurysm measuring 1.54 cm in maximal diameter in the left femoral artery aneurysm measuring 1.56 cm in maximal diameter.  These are stable.  He also has popliteal aneurysms measuring 1 cm in maximal diameter on the right and 1.1 cm in maximal diameter on the left.  These are stable as well.  Current Outpatient Medications  Medication Sig Dispense Refill  . benazepril (LOTENSIN) 20 MG tablet Take by mouth.    . clonazePAM (KLONOPIN) 0.5 MG tablet     . FLUoxetine (PROZAC) 20 MG capsule Take by mouth.    . fluticasone (FLONASE) 50 MCG/ACT nasal spray Place into the nose.    . levothyroxine (SYNTHROID, LEVOTHROID) 50 MCG tablet Take 50 mcg by mouth daily before breakfast.    . Omega-3 Fatty Acids (FISH OIL) 1000 MG CPDR Take by mouth.    Marland Kitchen omeprazole (PRILOSEC) 20 MG capsule     . pantoprazole (PROTONIX) 40 MG tablet     . pravastatin (PRAVACHOL) 40 MG tablet Take by mouth.    Marland Kitchen azelastine (ASTELIN) 0.1 % nasal spray Place into the nose.    . loratadine (CLARITIN) 10 MG tablet Take by mouth. (Patient not taking: Reported on 11/28/2019)     No current  facility-administered medications for this visit.    Past Medical History:  Diagnosis Date  . Anxiety   . Depression   . GERD (gastroesophageal reflux disease)   . Hyperlipidemia   . Hypertension   . Hypothyroidism     Past Surgical History:  Procedure Laterality Date  . ABDOMINAL AORTIC ANEURYSM REPAIR    . COLONOSCOPY WITH PROPOFOL N/A 05/10/2018   Procedure: COLONOSCOPY WITH PROPOFOL;  Surgeon: [name], MD;  Location: [facility] ENDOSCOPY;  Service: Endoscopy;  Laterality: N/A;  . TONSILLECTOMY AND ADENOIDECTOMY       Social History   Tobacco Use  . Smoking status: Never Smoker  . Smokeless tobacco: Never Used  Substance Use Topics  . Alcohol use: Yes  . Drug use: No      Family History  Problem Relation Age of Onset  . Varicose Veins Father   . Heart attack Brother   . Diabetes Brother      Allergies  Allergen Reactions  . Prednisone     Other reaction(s): Other (See Comments) irritiability     REVIEW OF SYSTEMS (Negative unless checked)  Constitutional: [] ?Weight loss  [] ?Fever  [] ?Chills Cardiac: [] ?Chest pain   [] ?Chest pressure   [] ?Palpitations   [] ?Shortness of breath when laying flat   [] ?Shortness of breath at rest   [] ?Shortness of breath with exertion. Vascular:  [] ?Pain in legs with  walking   [] ?Pain in legs at rest   [] ?Pain in legs when laying flat   [] ?Claudication   [] ?Pain in feet when walking  [] ?Pain in feet at rest  [] ?Pain in feet when laying flat   [] ?History of DVT   [] ?Phlebitis   [] ?Swelling in legs   [] ?Varicose veins   [] ?Non-healing ulcers Pulmonary:   [] ?Uses home oxygen   [] ?Productive cough   [] ?Hemoptysis   [] ?Wheeze  [] ?COPD   [] ?Asthma Neurologic:  [] ?Dizziness  [] ?Blackouts   [] ?Seizures   [] ?History of stroke   [] ?History of TIA  [] ?Aphasia   [] ?Temporary blindness   [] ?Dysphagia   [] ?Weakness or numbness in arms   [] ?Weakness or numbness in legs Musculoskeletal:  [x] ?Arthritis   [] ?Joint swelling   [] ?Joint pain   [] ?Low  back pain Hematologic:  [] ?Easy bruising  [] ?Easy bleeding   [] ?Hypercoagulable state   [] ?Anemic   Gastrointestinal:  [] ?Blood in stool   [] ?Vomiting blood  [x] ?Gastroesophageal reflux/heartburn   [] ?Abdominal pain Genitourinary:  [] ?Chronic kidney disease   [] ?Difficult urination  [] ?Frequent urination  [] ?Burning with urination   [] ?Hematuria Skin:  [] ?Rashes   [] ?Ulcers   [] ?Wounds Psychological:  [x] ?History of anxiety   [x] ? History of major depression.  Physical Examination  BP 133/81   Pulse (!) 48   Ht 6' (1.829 m)   Wt 209 lb (94.8 kg)   BMI 28.35 kg/m  Gen:  WD/WN, NAD Head: [place]/AT, No temporalis wasting. Ear/Nose/Throat: Hearing grossly intact, nares w/o erythema or drainage Eyes: Conjunctiva clear. Sclera non-icteric Neck: Supple.  Trachea midline Pulmonary:  Good air movement, no use of accessory muscles.  Cardiac: RRR, no JVD Vascular:  Vessel Right Left  Radial Palpable Palpable                  Fem enlarged enlarged  pop enlarged enlarged  PT Palpable Palpable  DP Palpable Palpable   Gastrointestinal: soft, non-tender/non-distended. No guarding/reflex. No increased aortic impulse Musculoskeletal: M/S 5/5 throughout.  No deformity or atrophy. No edema. Neurologic: Sensation grossly intact in extremities.  Symmetrical.  Speech is fluent.  Psychiatric: Judgment intact, Mood & affect appropriate for pt's clinical situation. Dermatologic: No rashes or ulcers noted.  No cellulitis or open wounds.       Labs No results found for this or any previous visit (from the past 2160 hour(s)).  Radiology No results found.  Assessment/Plan Hypertension blood pressure control important in reducing the progression of atherosclerotic disease and in his case particularly aneurysmal growth. On appropriate oral medications.   Hyperlipidemia lipid control important in reducing the progression of atherosclerotic disease. Continue statin therapy  AAA (abdominal  aortic aneurysm) without rupture (HCC) Duplex today shows a patent stent graft without endoleak and the maximal aortic sac diameter is just slightly larger than 3 cm.  He is several years status post repair.  We will check this on an annual basis going forward unless enlargement or endoleak develops.  Femoral artery aneurysm, bilateral (HCC) Duplex today demonstrates a right femoral artery aneurysm measuring 1.54 cm in maximal diameter in the left femoral artery aneurysm measuring 1.56 cm in maximal diameter.  These are stable.  He also has popliteal aneurysms measuring 1 cm in maximal diameter on the right and 1.1 cm in maximal diameter on the left.  These are stable as well. No role for intervention at that size.  Continue to follow on an annual basis.  Blood pressure control and tobacco avoidance are important to reduce risk of growth  and rupture/thrombosis.  Popliteal artery aneurysm (HCC) Duplex today demonstrates a right femoral artery aneurysm measuring 1.54 cm in maximal diameter in the left femoral artery aneurysm measuring 1.56 cm in maximal diameter.  These are stable.  He also has popliteal aneurysms measuring 1 cm in maximal diameter on the right and 1.1 cm in maximal diameter on the left.  These are stable as well. No role for intervention at that size.  Continue to follow on an annual basis.  Blood pressure control and tobacco avoidance are important to reduce risk of growth and rupture/thrombosis.    Leotis Pain, MD  11/28/2019 9:38 AM    This note was created with Dragon medical transcription system.  Any errors from dictation are purely unintentional

## 2019-11-28 NOTE — Patient Instructions (Signed)
Abdominal Aortic Aneurysm  An aneurysm is a bulge in one of the blood vessels that carry blood away from the heart (artery). It happens when blood pushes up against a weak or damaged place in the wall of an artery. An abdominal aortic aneurysm happens in the main artery of the body (aorta). Some aneurysms may not cause problems. If it grows, it can burst or tear, causing bleeding inside the body. This is an emergency. It needs to be treated right away. What are the causes? The exact cause of this condition is not known. What increases the risk? The following may make you more likely to get this condition:  Being a male who is 60 years of age or older.  Being white (Caucasian).  Using tobacco.  Having a family history of aneurysms.  Having the following conditions: ? Hardening of the arteries (arteriosclerosis). ? Inflammation of the walls of an artery (arteritis). ? Certain genetic conditions. ? Being very overweight (obesity). ? An infection in the wall of the aorta (infectious aortitis). ? High cholesterol. ? High blood pressure (hypertension). What are the signs or symptoms? Symptoms depend on the size of the aneurysm and how fast it is growing. Most grow slowly and do not cause any symptoms. If symptoms do occur, they may include:  Pain in the belly (abdomen), side, or back.  Feeling full after eating only small amounts of food.  Feeling a throbbing lump in the belly. Symptoms that the aneurysm has burst (ruptured) include:  Sudden, very bad pain in the belly, side, or back.  Feeling sick to your stomach (nauseous).  Throwing up (vomiting).  Feeling light-headed or passing out. How is this treated? Treatment for this condition depends on:  The size of the aneurysm.  How fast it is growing.  Your age.  Your risk of having it burst. If your aneurysm is smaller than 2 inches (5 cm), your doctor may manage it by:  Checking it often to see if it is getting bigger.  You may have an imaging test (ultrasound) to check it every 3-6 months, every year, or every few years.  Giving you medicines to: ? Control blood pressure. ? Treat pain. ? Fight infection. If your aneurysm is larger than 2 inches (5 cm), you may need surgery to fix it. Follow these instructions at home: Lifestyle  Do not use any products that have nicotine or tobacco in them. This includes cigarettes, e-cigarettes, and chewing tobacco. If you need help quitting, ask your doctor.  Get regular exercise. Ask your doctor what types of exercise are best for you. Eating and drinking  Eat a heart-healthy diet. This includes eating plenty of: ? Fresh fruits and vegetables. ? Whole grains. ? Low-fat (lean) protein. ? Low-fat dairy products.  Avoid foods that are high in saturated fat and cholesterol. These foods include red meat and some dairy products.  Do not drink alcohol if: ? Your doctor tells you not to drink. ? You are pregnant, may be pregnant, or are planning to become pregnant.  If you drink alcohol: ? Limit how much you use to:  0-1 drink a day for women.  0-2 drinks a day for men. ? Be aware of how much alcohol is in your drink. In the [place] one drink equals any of these:  One typical bottle of beer (12 oz).  One-half glass of wine (5 oz).  One shot of hard liquor (1 oz). General instructions  Take over-the-counter and prescription medicines only as   told by your doctor.  Keep your blood pressure within normal limits. Ask your doctor what your blood pressure should be.  Have your blood sugar (glucose) level and cholesterol levels checked regularly. Keep your blood sugar level and cholesterol levels within normal limits.  Avoid heavy lifting and activities that take a lot of effort. Ask your doctor what activities are safe for you.  Keep all follow-up visits as told by your doctor. This is important. ? Talk to your doctor about regular screenings to see if the  aneurysm is getting bigger. Contact a doctor if you:  Have pain in your belly, side, or back.  Have a throbbing feeling in your belly.  Have a family history of aneurysms. Get help right away if you:  Have sudden, bad pain in your belly, side, or back.  Feel sick to your stomach.  Throw up.  Have trouble pooping (constipation).  Have trouble peeing (urinating).  Feel light-headed.  Have a fast heart rate when you stand.  Have sweaty skin that is cold to the touch (clammy).  Have shortness of breath.  Have a fever. These symptoms may be an emergency. Do not wait to see if the symptoms will go away. Get medical help right away. Call your local emergency services (911 in the [place] Do not drive yourself to the hospital. Summary  An aneurysm is a bulge in one of the blood vessels that carry blood away from the heart (artery). Some aneurysms may not cause problems.  You may need to have yours checked often. If it grows, it can burst or tear. This causes bleeding inside the body. It needs to be treated right away.  Follow instructions from your doctor about healthy lifestyle changes.  Keep all follow-up visits as told by your doctor. This is important. This information is not intended to replace advice given to you by your health care provider. Make sure you discuss any questions you have with your health care provider. Document Revised: 05/30/2018 Document Reviewed: 09/18/2017 Elsevier Patient Education  2020 [employer]  

## 2019-11-28 NOTE — Assessment & Plan Note (Signed)
Duplex today shows a patent stent graft without endoleak and the maximal aortic sac diameter is just slightly larger than 3 cm.  He is several years status post repair.  We will check this on an annual basis going forward unless enlargement or endoleak develops.

## 2019-11-28 NOTE — Assessment & Plan Note (Signed)
Duplex today demonstrates a right femoral artery aneurysm measuring 1.54 cm in maximal diameter in the left femoral artery aneurysm measuring 1.56 cm in maximal diameter.  These are stable.  He also has popliteal aneurysms measuring 1 cm in maximal diameter on the right and 1.1 cm in maximal diameter on the left.  These are stable as well. No role for intervention at that size.  Continue to follow on an annual basis.  Blood pressure control and tobacco avoidance are important to reduce risk of growth and rupture/thrombosis.

## 2020-01-15 DIAGNOSIS — G4733 Obstructive sleep apnea (adult) (pediatric): Secondary | ICD-10-CM | POA: Diagnosis not present

## 2020-03-15 DIAGNOSIS — J0110 Acute frontal sinusitis, unspecified: Secondary | ICD-10-CM | POA: Diagnosis not present

## 2020-04-09 DIAGNOSIS — D225 Melanocytic nevi of trunk: Secondary | ICD-10-CM | POA: Diagnosis not present

## 2020-04-09 DIAGNOSIS — X32XXXA Exposure to sunlight, initial encounter: Secondary | ICD-10-CM | POA: Diagnosis not present

## 2020-04-09 DIAGNOSIS — L570 Actinic keratosis: Secondary | ICD-10-CM | POA: Diagnosis not present

## 2020-04-09 DIAGNOSIS — D2262 Melanocytic nevi of left upper limb, including shoulder: Secondary | ICD-10-CM | POA: Diagnosis not present

## 2020-04-09 DIAGNOSIS — D2261 Melanocytic nevi of right upper limb, including shoulder: Secondary | ICD-10-CM | POA: Diagnosis not present

## 2020-04-09 DIAGNOSIS — D2272 Melanocytic nevi of left lower limb, including hip: Secondary | ICD-10-CM | POA: Diagnosis not present

## 2020-04-09 DIAGNOSIS — Z85828 Personal history of other malignant neoplasm of skin: Secondary | ICD-10-CM | POA: Diagnosis not present

## 2020-04-09 DIAGNOSIS — L821 Other seborrheic keratosis: Secondary | ICD-10-CM | POA: Diagnosis not present

## 2020-04-15 DIAGNOSIS — G4733 Obstructive sleep apnea (adult) (pediatric): Secondary | ICD-10-CM | POA: Diagnosis not present

## 2020-04-30 DIAGNOSIS — H524 Presbyopia: Secondary | ICD-10-CM | POA: Diagnosis not present

## 2020-05-07 DIAGNOSIS — E039 Hypothyroidism, unspecified: Secondary | ICD-10-CM | POA: Diagnosis not present

## 2020-05-07 DIAGNOSIS — I10 Essential (primary) hypertension: Secondary | ICD-10-CM | POA: Diagnosis not present

## 2020-05-07 DIAGNOSIS — Z125 Encounter for screening for malignant neoplasm of prostate: Secondary | ICD-10-CM | POA: Diagnosis not present

## 2020-05-07 DIAGNOSIS — E785 Hyperlipidemia, unspecified: Secondary | ICD-10-CM | POA: Diagnosis not present

## 2020-05-07 DIAGNOSIS — Z79899 Other long term (current) drug therapy: Secondary | ICD-10-CM | POA: Diagnosis not present

## 2020-06-28 DIAGNOSIS — G4733 Obstructive sleep apnea (adult) (pediatric): Secondary | ICD-10-CM | POA: Diagnosis not present

## 2020-06-28 DIAGNOSIS — R109 Unspecified abdominal pain: Secondary | ICD-10-CM | POA: Diagnosis not present

## 2020-06-28 DIAGNOSIS — Z79899 Other long term (current) drug therapy: Secondary | ICD-10-CM | POA: Diagnosis not present

## 2020-06-28 DIAGNOSIS — I10 Essential (primary) hypertension: Secondary | ICD-10-CM | POA: Diagnosis not present

## 2020-06-28 DIAGNOSIS — Z0000 Encounter for general adult medical examination without abnormal findings: Secondary | ICD-10-CM | POA: Diagnosis not present

## 2020-07-01 ENCOUNTER — Other Ambulatory Visit: Payer: Self-pay | Admitting: Internal Medicine

## 2020-07-01 DIAGNOSIS — R109 Unspecified abdominal pain: Secondary | ICD-10-CM

## 2020-07-04 DIAGNOSIS — G4733 Obstructive sleep apnea (adult) (pediatric): Secondary | ICD-10-CM | POA: Diagnosis not present

## 2020-07-04 DIAGNOSIS — R0789 Other chest pain: Secondary | ICD-10-CM | POA: Diagnosis not present

## 2020-07-04 DIAGNOSIS — E782 Mixed hyperlipidemia: Secondary | ICD-10-CM | POA: Diagnosis not present

## 2020-07-04 DIAGNOSIS — R001 Bradycardia, unspecified: Secondary | ICD-10-CM | POA: Diagnosis not present

## 2020-07-04 DIAGNOSIS — Z9989 Dependence on other enabling machines and devices: Secondary | ICD-10-CM | POA: Diagnosis not present

## 2020-07-04 DIAGNOSIS — I10 Essential (primary) hypertension: Secondary | ICD-10-CM | POA: Diagnosis not present

## 2020-07-04 DIAGNOSIS — R0602 Shortness of breath: Secondary | ICD-10-CM | POA: Diagnosis not present

## 2020-07-10 DIAGNOSIS — J321 Chronic frontal sinusitis: Secondary | ICD-10-CM | POA: Diagnosis not present

## 2020-07-12 DIAGNOSIS — J329 Chronic sinusitis, unspecified: Secondary | ICD-10-CM | POA: Diagnosis not present

## 2020-07-12 DIAGNOSIS — J342 Deviated nasal septum: Secondary | ICD-10-CM | POA: Diagnosis not present

## 2020-07-12 DIAGNOSIS — J324 Chronic pansinusitis: Secondary | ICD-10-CM | POA: Diagnosis not present

## 2020-07-12 DIAGNOSIS — J301 Allergic rhinitis due to pollen: Secondary | ICD-10-CM | POA: Diagnosis not present

## 2020-07-15 DIAGNOSIS — G4733 Obstructive sleep apnea (adult) (pediatric): Secondary | ICD-10-CM | POA: Diagnosis not present

## 2020-07-23 DIAGNOSIS — J301 Allergic rhinitis due to pollen: Secondary | ICD-10-CM | POA: Diagnosis not present

## 2020-07-23 DIAGNOSIS — J320 Chronic maxillary sinusitis: Secondary | ICD-10-CM | POA: Diagnosis not present

## 2020-08-01 ENCOUNTER — Other Ambulatory Visit: Payer: Self-pay

## 2020-08-01 ENCOUNTER — Ambulatory Visit
Admission: RE | Admit: 2020-08-01 | Discharge: 2020-08-01 | Disposition: A | Discharge status: Home / Self Care | Payer: Medicare HMO | Source: Ambulatory Visit | Attending: Internal Medicine | Admitting: Internal Medicine

## 2020-08-01 DIAGNOSIS — R109 Unspecified abdominal pain: Secondary | ICD-10-CM | POA: Insufficient documentation

## 2020-08-09 ENCOUNTER — Other Ambulatory Visit: Payer: Self-pay

## 2020-08-09 ENCOUNTER — Encounter: Payer: Self-pay | Admitting: Urology

## 2020-08-09 ENCOUNTER — Ambulatory Visit: Payer: Medicare HMO | Admitting: Urology

## 2020-08-09 VITALS — BP 176/83 | HR 0 | Ht 72.0 in | Wt 205.0 lb

## 2020-08-09 DIAGNOSIS — N401 Enlarged prostate with lower urinary tract symptoms: Secondary | ICD-10-CM

## 2020-08-09 DIAGNOSIS — N3289 Other specified disorders of bladder: Secondary | ICD-10-CM | POA: Diagnosis not present

## 2020-08-09 LAB — URINALYSIS, COMPLETE
Bilirubin, UA: NEGATIVE
Glucose, UA: NEGATIVE
Ketones, UA: NEGATIVE
Leukocytes,UA: NEGATIVE
Nitrite, UA: NEGATIVE
Protein,UA: NEGATIVE
RBC, UA: NEGATIVE
Specific Gravity, UA: <1.005 — ABNORMAL LOW (ref 1.005–1.030)
Urobilinogen, Ur: 0.2 mg/dL (ref 0.2–1.0)
pH, UA: 5.5 (ref 5.0–7.5)

## 2020-08-09 LAB — MICROSCOPIC EXAMINATION
Bacteria, UA: NONE SEEN
Epithelial Cells (non renal): NONE SEEN /hpf (ref 0–10)

## 2020-08-09 NOTE — Progress Notes (Signed)
08/09/2020 10:57 AM   Mitchell Powell 14-Sep-1950 [id number]  Referring provider: Idelle Crouch, MD [facility] [place],  [place] [address]  Chief Complaint  Patient presents with   Other    HPI: Mitchell Powell is a 70 y.o. male referred for a possible bladder mass.  Saw Dr. Doy Powell 06/28/2020 for annual physical with complaints of right flank/back pain and nocturia/frequency Renal ultrasound was ordered and performed 08/01/2020 which showed normal-appearing kidneys bilaterally without masses, calculi or hydronephrosis There was a question of a solid posterior wall bladder mass measuring 2.1 x 2.3 x 3.2 cm Was also having urgency and postvoid dribbling Was started on doxazosin 2 mg nightly and states his symptoms have improved   PMH: Past Medical History:  Diagnosis Date   Anxiety    Depression    GERD (gastroesophageal reflux disease)    Hyperlipidemia    Hypertension    Hypothyroidism     Surgical History: Past Surgical History:  Procedure Laterality Date   ABDOMINAL AORTIC ANEURYSM REPAIR     COLONOSCOPY WITH PROPOFOL N/A 05/10/2018   Procedure: COLONOSCOPY WITH PROPOFOL;  Surgeon: Mitchell Lame, MD;  Location: [facility] ENDOSCOPY;  Service: Endoscopy;  Laterality: N/A;   TONSILLECTOMY AND ADENOIDECTOMY      Home Medications:  Allergies as of 08/09/2020       Reactions   Prednisone    Other reaction(s): Other (See Comments) irritiability        Medication List        Accurate as of August 09, 2020 10:57 AM. If you have any questions, ask your nurse or doctor.          azelastine 0.1 % nasal spray Commonly known as: ASTELIN Place into the nose.   benazepril 20 MG tablet Commonly known as: LOTENSIN Take by mouth.   clonazePAM 0.5 MG tablet Commonly known as: KLONOPIN   Fish Oil 1000 MG Cpdr Take by mouth.   FLUoxetine 20 MG capsule Commonly known as: PROZAC Take by mouth.   fluticasone 50 MCG/ACT nasal spray Commonly  known as: FLONASE Place into the nose.   levothyroxine 50 MCG tablet Commonly known as: SYNTHROID Take 50 mcg by mouth daily before breakfast.   loratadine 10 MG tablet Commonly known as: CLARITIN Take by mouth.   omeprazole 20 MG capsule Commonly known as: PRILOSEC   pantoprazole 40 MG tablet Commonly known as: PROTONIX   pravastatin 40 MG tablet Commonly known as: PRAVACHOL Take by mouth.        Allergies:  Allergies  Allergen Reactions   Prednisone     Other reaction(s): Other (See Comments) irritiability    Family History: Family History  Problem Relation Age of Onset   Varicose Veins Father    Heart attack Brother    Diabetes Brother     Social History:  reports that he has never smoked. He has never used smokeless tobacco. He reports current alcohol use. He reports that he does not use drugs.   Physical Exam: BP (!) 176/83   Pulse (!) 0   Ht 6' (1.829 m)   Wt 205 lb (93 kg)   BMI 27.80 kg/m   Constitutional:  Alert and oriented, No acute distress. HEENT:  AT, moist mucus membranes.  Trachea midline, no masses. Cardiovascular: No clubbing, cyanosis, or edema. Respiratory: Normal respiratory effort, no increased work of breathing. GU: Prostate 60 g, smooth without nodules Skin: No rashes, bruises or suspicious lesions. Neurologic: Grossly intact, no  focal deficits, moving all 4 extremities. Psychiatric: Normal mood and affect.   Pertinent Imaging: Ultrasound images were personally reviewed and interpreted.  The mass in question is most likely median lobe prostate tissue.  US RENAL  Narrative CLINICAL DATA:  Right flank pain  EXAM: RENAL / URINARY TRACT ULTRASOUND COMPLETE  COMPARISON:  CT scan December 16, 2018  FINDINGS: Right Kidney:  Renal measurements: 12.1 x 6.5 x 5.7 cm = volume: 237 mL. Echogenicity within normal limits. No mass or hydronephrosis visualized.  Left Kidney:  Renal measurements: 12.2 x 7.6 x 6.4 cm = volume:  312 mL. Echogenicity within normal limits. No mass or hydronephrosis visualized.  Bladder:  There is question of a solid masslike area at the posterior bladder measuring 2.1 x 2.3 x 3.2 cm. I suspect this is impression on the posterior bladder by the prostate. On provided images, a posterior bladder mass or an anterior prostate mass cannot be excluded.  Other:  None.  IMPRESSION: 1. There is a 2.1 x 2.3 x 3.2 cm solid structure in the region of the posterior bladder. This is favored to represent the anterior aspect of the prostate exerting mass effect on the posterior bladder. On real-time imaging, a posterior bladder mass or anterior prostate mass were not excluded. Recommend urologic consultation. 2. The kidneys are normal in appearance.   Electronically Signed By: Mitchell Powell III M.D On: 08/03/2020 17:34   Assessment & Plan:    1.  Bladder mass Most likely median lobe prostate tissue Recommend scheduling cystoscopy  2.  BPH with LUTS Voiding symptoms have improved on doxazosin Based on improvement can titrate or switch to a more prostate-specific alpha-blocker   Mitchell Sons, MD  [facility] [address], [place], [place] [address] [phone]

## 2020-08-13 DIAGNOSIS — D485 Neoplasm of uncertain behavior of skin: Secondary | ICD-10-CM | POA: Diagnosis not present

## 2020-08-13 DIAGNOSIS — L570 Actinic keratosis: Secondary | ICD-10-CM | POA: Diagnosis not present

## 2020-08-13 DIAGNOSIS — L538 Other specified erythematous conditions: Secondary | ICD-10-CM | POA: Diagnosis not present

## 2020-08-19 DIAGNOSIS — L910 Hypertrophic scar: Secondary | ICD-10-CM | POA: Diagnosis not present

## 2020-08-19 DIAGNOSIS — L570 Actinic keratosis: Secondary | ICD-10-CM | POA: Diagnosis not present

## 2020-08-19 DIAGNOSIS — D485 Neoplasm of uncertain behavior of skin: Secondary | ICD-10-CM | POA: Diagnosis not present

## 2020-08-20 DIAGNOSIS — U071 COVID-19: Secondary | ICD-10-CM | POA: Diagnosis not present

## 2020-08-20 DIAGNOSIS — Z03818 Encounter for observation for suspected exposure to other biological agents ruled out: Secondary | ICD-10-CM | POA: Diagnosis not present

## 2020-08-20 DIAGNOSIS — R0789 Other chest pain: Secondary | ICD-10-CM | POA: Diagnosis not present

## 2020-08-20 DIAGNOSIS — J019 Acute sinusitis, unspecified: Secondary | ICD-10-CM | POA: Diagnosis not present

## 2020-08-20 DIAGNOSIS — R079 Chest pain, unspecified: Secondary | ICD-10-CM | POA: Diagnosis not present

## 2020-08-20 DIAGNOSIS — J439 Emphysema, unspecified: Secondary | ICD-10-CM | POA: Diagnosis not present

## 2020-08-27 DIAGNOSIS — Z20822 Contact with and (suspected) exposure to covid-19: Secondary | ICD-10-CM | POA: Diagnosis not present

## 2020-08-28 DIAGNOSIS — Z20822 Contact with and (suspected) exposure to covid-19: Secondary | ICD-10-CM | POA: Diagnosis not present

## 2020-09-04 DIAGNOSIS — J40 Bronchitis, not specified as acute or chronic: Secondary | ICD-10-CM | POA: Diagnosis not present

## 2020-09-12 ENCOUNTER — Encounter: Payer: Self-pay | Admitting: Urology

## 2020-09-12 ENCOUNTER — Ambulatory Visit: Payer: Medicare HMO | Admitting: Urology

## 2020-09-12 ENCOUNTER — Other Ambulatory Visit: Payer: Self-pay

## 2020-09-12 VITALS — BP 134/74 | HR 71 | Ht 71.0 in | Wt 200.0 lb

## 2020-09-12 DIAGNOSIS — R079 Chest pain, unspecified: Secondary | ICD-10-CM | POA: Diagnosis not present

## 2020-09-12 DIAGNOSIS — N3289 Other specified disorders of bladder: Secondary | ICD-10-CM

## 2020-09-12 DIAGNOSIS — R0789 Other chest pain: Secondary | ICD-10-CM | POA: Diagnosis not present

## 2020-09-12 DIAGNOSIS — J431 Panlobular emphysema: Secondary | ICD-10-CM | POA: Diagnosis not present

## 2020-09-12 NOTE — Progress Notes (Signed)
   09/14/20  CC:  Chief Complaint  Patient presents with   Cysto    HPI: Refer to prior note 08/09/2020.  Questionable bladder mass on bladder survey views of renal ultrasound  Blood pressure 134/74, pulse 71, height 5\' 11"  (1.803 m), weight 200 lb (90.7 kg). NED. A&Ox3.   No respiratory distress   Abd soft, NT, ND Normal phallus with bilateral descended testicles  Cystoscopy Procedure Note  Patient identification was confirmed, informed consent was obtained, and patient was prepped using Betadine solution.  Lidocaine jelly was administered per urethral meatus.     Pre-Procedure: - Inspection reveals a normal caliber urethral meatus.  Procedure: The flexible cystoscope was introduced without difficulty - No urethral strictures/lesions are present. -Prominent lateral lobe enlargement prostate  -Moderate elevation bladder neck - Bilateral ureteral orifices identified - Bladder mucosa  reveals no ulcers, tumors, or lesions - No bladder stones - No trabeculation  Retroflexion shows intravesical median lobe and lateral lobe intravesical extension.  No other bladder lesions identified on close inspection of anterior/posterior bladder wall   Post-Procedure: - Patient tolerated the procedure well  Assessment/ Plan: BPH with intravesical extension Symptoms have significantly improved on doxazosin Discussed no treatment needed for this finding Annual follow-up for symptom check    Abbie Sons, MD

## 2020-09-13 LAB — URINALYSIS, COMPLETE
Bilirubin, UA: NEGATIVE
Glucose, UA: NEGATIVE
Ketones, UA: NEGATIVE
Leukocytes,UA: NEGATIVE
Nitrite, UA: NEGATIVE
Protein,UA: NEGATIVE
RBC, UA: NEGATIVE
Specific Gravity, UA: 1.01 (ref 1.005–1.030)
Urobilinogen, Ur: 0.2 mg/dL (ref 0.2–1.0)
pH, UA: 5 (ref 5.0–7.5)

## 2020-09-13 LAB — MICROSCOPIC EXAMINATION
Bacteria, UA: NONE SEEN
Epithelial Cells (non renal): NONE SEEN /hpf (ref 0–10)

## 2020-09-14 ENCOUNTER — Encounter: Payer: Self-pay | Admitting: Urology

## 2020-10-16 DIAGNOSIS — G4733 Obstructive sleep apnea (adult) (pediatric): Secondary | ICD-10-CM | POA: Diagnosis not present

## 2020-10-24 DIAGNOSIS — M545 Low back pain, unspecified: Secondary | ICD-10-CM | POA: Diagnosis not present

## 2020-10-29 DIAGNOSIS — G4733 Obstructive sleep apnea (adult) (pediatric): Secondary | ICD-10-CM | POA: Diagnosis not present

## 2020-10-29 DIAGNOSIS — Z23 Encounter for immunization: Secondary | ICD-10-CM | POA: Diagnosis not present

## 2020-10-29 DIAGNOSIS — Z0000 Encounter for general adult medical examination without abnormal findings: Secondary | ICD-10-CM | POA: Diagnosis not present

## 2020-10-29 DIAGNOSIS — F419 Anxiety disorder, unspecified: Secondary | ICD-10-CM | POA: Diagnosis not present

## 2020-10-29 DIAGNOSIS — I10 Essential (primary) hypertension: Secondary | ICD-10-CM | POA: Diagnosis not present

## 2020-10-29 DIAGNOSIS — E785 Hyperlipidemia, unspecified: Secondary | ICD-10-CM | POA: Diagnosis not present

## 2020-10-29 DIAGNOSIS — M549 Dorsalgia, unspecified: Secondary | ICD-10-CM | POA: Diagnosis not present

## 2020-10-29 DIAGNOSIS — Z79899 Other long term (current) drug therapy: Secondary | ICD-10-CM | POA: Diagnosis not present

## 2020-10-29 DIAGNOSIS — E079 Disorder of thyroid, unspecified: Secondary | ICD-10-CM | POA: Diagnosis not present

## 2020-10-30 ENCOUNTER — Other Ambulatory Visit (HOSPITAL_COMMUNITY): Payer: Self-pay | Admitting: Internal Medicine

## 2020-10-30 ENCOUNTER — Other Ambulatory Visit: Payer: Self-pay | Admitting: Internal Medicine

## 2020-10-30 DIAGNOSIS — M5440 Lumbago with sciatica, unspecified side: Secondary | ICD-10-CM

## 2020-11-07 ENCOUNTER — Ambulatory Visit
Admission: RE | Admit: 2020-11-07 | Discharge: 2020-11-07 | Disposition: A | Discharge status: Home / Self Care | Payer: Medicare HMO | Source: Ambulatory Visit | Attending: Internal Medicine | Admitting: Internal Medicine

## 2020-11-07 ENCOUNTER — Other Ambulatory Visit: Payer: Self-pay

## 2020-11-07 DIAGNOSIS — M5440 Lumbago with sciatica, unspecified side: Secondary | ICD-10-CM | POA: Insufficient documentation

## 2020-11-07 DIAGNOSIS — M5126 Other intervertebral disc displacement, lumbar region: Secondary | ICD-10-CM | POA: Diagnosis not present

## 2020-11-07 DIAGNOSIS — M48061 Spinal stenosis, lumbar region without neurogenic claudication: Secondary | ICD-10-CM | POA: Diagnosis not present

## 2020-11-26 ENCOUNTER — Ambulatory Visit (INDEPENDENT_AMBULATORY_CARE_PROVIDER_SITE_OTHER): Payer: Medicare HMO

## 2020-11-26 ENCOUNTER — Encounter (INDEPENDENT_AMBULATORY_CARE_PROVIDER_SITE_OTHER): Payer: Self-pay | Admitting: Nurse Practitioner

## 2020-11-26 ENCOUNTER — Other Ambulatory Visit: Payer: Self-pay

## 2020-11-26 ENCOUNTER — Ambulatory Visit (INDEPENDENT_AMBULATORY_CARE_PROVIDER_SITE_OTHER): Payer: Medicare HMO | Admitting: Nurse Practitioner

## 2020-11-26 VITALS — BP 146/80 | HR 53 | Ht 72.0 in | Wt 202.0 lb

## 2020-11-26 DIAGNOSIS — I724 Aneurysm of artery of lower extremity: Secondary | ICD-10-CM

## 2020-11-26 DIAGNOSIS — I7143 Infrarenal abdominal aortic aneurysm, without rupture: Secondary | ICD-10-CM | POA: Diagnosis not present

## 2020-11-26 DIAGNOSIS — I714 Abdominal aortic aneurysm, without rupture, unspecified: Secondary | ICD-10-CM

## 2020-11-26 DIAGNOSIS — I10 Essential (primary) hypertension: Secondary | ICD-10-CM

## 2020-11-28 DIAGNOSIS — M48061 Spinal stenosis, lumbar region without neurogenic claudication: Secondary | ICD-10-CM | POA: Diagnosis not present

## 2020-11-28 DIAGNOSIS — M5136 Other intervertebral disc degeneration, lumbar region: Secondary | ICD-10-CM | POA: Diagnosis not present

## 2020-11-28 DIAGNOSIS — M5416 Radiculopathy, lumbar region: Secondary | ICD-10-CM | POA: Diagnosis not present

## 2020-12-02 ENCOUNTER — Encounter (INDEPENDENT_AMBULATORY_CARE_PROVIDER_SITE_OTHER): Payer: Self-pay | Admitting: Nurse Practitioner

## 2020-12-02 NOTE — Progress Notes (Signed)
Subjective:    Patient ID: Mitchell Powell, male    DOB: 01/20/1951, 70 y.o.   MRN: [id number] Chief Complaint  Patient presents with   Follow-up    79yr evar BLE BLE Arterial     Mitchell Powell is a 70 year old male that returns to the office for surveillance of an abdominal aortic aneurysm status post stent graft placement approximately 7 or 8 years ago.Patient denies abdominal pain or back pain, no other abdominal complaints. No groin related complaints. No symptoms consistent with distal embolization No changes in claudication distance.   Patient also has known femoral and popliteal arteries and he is being followed for as well.  He denies any issues with these such as claudication or distal embolization.  There have been no interval changes in his overall healthcare since his last visit.   Patient denies amaurosis fugax or TIA symptoms. There is no history of claudication or rest pain symptoms of the lower extremities. The patient denies angina or shortness of breath.   Duplex today shows no evidence of endoleak.  Largest aortic diameter measurement is 2.77 cm.  In 2020 was 3 cm.Duplex today demonstrates a right femoral artery aneurysm measuring 1.50 cm in maximal diameter in the left femoral artery aneurysm measuring 1.68 cm in maximal diameter.  These are stable.  He also has popliteal aneurysms measuring 0.95 cm in maximal diameter on the right and 0.98 cm in maximal diameter on the left.    Review of Systems  Cardiovascular:  Negative for leg swelling.  Gastrointestinal:  Negative for abdominal pain.  All other systems reviewed and are negative.     Objective:   Physical Exam Vitals reviewed.  HENT:     Head: Normocephalic.  Cardiovascular:     Rate and Rhythm: Normal rate.     Pulses: Normal pulses.  Pulmonary:     Effort: Pulmonary effort is normal.  Skin:    General: Skin is warm and dry.  Neurological:     Mental Status: He is alert and oriented to person, place, and  time.  Psychiatric:        Mood and Affect: Mood normal.        Behavior: Behavior normal.        Thought Content: Thought content normal.        Judgment: Judgment normal.    BP (!) 146/80   Pulse (!) 53   Ht 6' (1.829 m)   Wt 202 lb (91.6 kg)   BMI 27.40 kg/m   Past Medical History:  Diagnosis Date   Anxiety    Depression    GERD (gastroesophageal reflux disease)    Hyperlipidemia    Hypertension    Hypothyroidism     Social History   Socioeconomic History   Marital status: Married    Spouse name: Not on file   Number of children: Not on file   Years of education: Not on file   Highest education level: Not on file  Occupational History   Not on file  Tobacco Use   Smoking status: Never   Smokeless tobacco: Never  Substance and Sexual Activity   Alcohol use: Yes   Drug use: No   Sexual activity: Not on file  Other Topics Concern   Not on file  Social History Narrative   Not on file   Social Determinants of Health   Financial Resource Strain: Not on file  Food Insecurity: Not on file  Transportation Needs: Not on file  Physical Activity: Not on file  Stress: Not on file  Social Connections: Not on file  Intimate Partner Violence: Not on file    Past Surgical History:  Procedure Laterality Date   ABDOMINAL AORTIC ANEURYSM REPAIR     COLONOSCOPY WITH PROPOFOL N/A 05/10/2018   Procedure: COLONOSCOPY WITH PROPOFOL;  Surgeon: [name], MD;  Location: [facility] ENDOSCOPY;  Service: Endoscopy;  Laterality: N/A;   TONSILLECTOMY AND ADENOIDECTOMY      Family History  Problem Relation Age of Onset   Varicose Veins Father    Heart attack Brother    Diabetes Brother     Allergies  Allergen Reactions   Prednisone     Other reaction(s): Other (See Comments) irritiability    CBC Latest Ref Rng & Units 11/21/2014 12/03/2011 12/02/2011  WBC 3.8 - 10.6 K/uL 13.3(H) 10.0 6.7  Hemoglobin 13.0 - 18.0 g/dL 15.3 12.8(L) 13.6  Hematocrit 40.0 - 52.0 % 45.9  36.5(L) 39.6(L)  Platelets 150 - 440 K/uL 203 174 177      CMP     Component Value Date/Time   NA 140 11/21/2014 1455   NA 141 12/03/2011 0450   K 3.8 11/21/2014 1455   K 3.8 12/03/2011 0450   CL 105 11/21/2014 1455   CL 107 12/03/2011 0450   CO2 27 11/21/2014 1455   CO2 26 12/03/2011 0450   GLUCOSE 92 11/21/2014 1455   GLUCOSE 101 (H) 12/03/2011 0450   BUN 16 11/21/2014 1455   BUN 10 12/03/2011 0450   CREATININE 0.90 12/16/2018 1532   CREATININE 0.85 12/03/2011 0450   CALCIUM 9.8 11/21/2014 1455   CALCIUM 8.2 (L) 12/03/2011 0450   PROT 8.0 11/21/2014 1455   PROT 6.1 (L) 12/03/2011 0450   ALBUMIN 4.8 11/21/2014 1455   ALBUMIN 3.4 12/03/2011 0450   AST 22 11/21/2014 1455   AST 15 12/03/2011 0450   ALT 21 11/21/2014 1455   ALT 22 12/03/2011 0450   ALKPHOS 72 11/21/2014 1455   ALKPHOS 66 12/03/2011 0450   BILITOT 0.7 11/21/2014 1455   BILITOT 0.4 12/03/2011 0450   GFRNONAA >60 11/21/2014 1455   GFRNONAA >60 12/03/2011 0450   GFRAA >60 11/21/2014 1455   GFRAA >60 12/03/2011 0450     No results found.     Assessment & Plan:    1. Infrarenal abdominal aortic aneurysm (AAA) without rupture Recommend: Patient is status post successful endovascular repair of the AAA.   No further intervention is required at this time.   No endoleak is detected and the aneurysm sac is stable.  The patient will continue antiplatelet therapy as prescribed as well as aggressive management of hyperlipidemia. Exercise is again strongly encouraged.   However, endografts require continued surveillance with ultrasound or CT scan. This is mandatory to detect any changes that allow repressurization of the aneurysm sac.  The patient is informed that this would be asymptomatic.  The patient is reminded that lifelong routine surveillance is a necessity with an endograft. Patient will continue to follow-up at 12 month intervals with ultrasound of the aorta.  - VAS [place] EVAR DUPLEX; Future  2.  Popliteal artery aneurysm (HCC) Duplex today demonstrates a right femoral artery aneurysm measuring 1.50 cm in maximal diameter in the left femoral artery aneurysm measuring 1.68 cm in maximal diameter.  These are stable.  He also has popliteal aneurysms measuring 0.95 cm in maximal diameter on the right and 0.98 cm in maximal diameter on the left.  These are stable as well. No  role for intervention at that size.  Continue to follow on an annual basis.  Blood pressure control and tobacco avoidance are important to reduce risk of growth and rupture/thrombosis. - VAS [place] LOWER EXTREMITY ARTERIAL DUPLEX; Future  3. Primary hypertension Continue antihypertensive medications as already ordered, these medications have been reviewed and there are no changes at this time.   4. Femoral artery aneurysm, bilateral (HCC) Duplex today demonstrates a right femoral artery aneurysm measuring 1.50 cm in maximal diameter in the left femoral artery aneurysm measuring 1.68 cm in maximal diameter.  These are stable.  He also has popliteal aneurysms measuring 0.95 cm in maximal diameter on the right and 0.98 cm in maximal diameter on the left.  These are stable as well. No role for intervention at that size.  Continue to follow on an annual basis.  Blood pressure control and tobacco avoidance are important to reduce risk of growth and rupture/thrombosis.   Current Outpatient Medications on File Prior to Visit  Medication Sig Dispense Refill   benazepril (LOTENSIN) 20 MG tablet Take by mouth.     clonazePAM (KLONOPIN) 0.5 MG tablet      FLUoxetine (PROZAC) 20 MG capsule Take by mouth.     fluticasone (FLONASE) 50 MCG/ACT nasal spray Place into the nose.     levothyroxine (SYNTHROID, LEVOTHROID) 50 MCG tablet Take 50 mcg by mouth daily before breakfast.     loratadine (CLARITIN) 10 MG tablet Take by mouth.     Omega-3 Fatty Acids (FISH OIL) 1000 MG CPDR Take by mouth.     omeprazole (PRILOSEC) 20 MG capsule       pantoprazole (PROTONIX) 40 MG tablet      pravastatin (PRAVACHOL) 40 MG tablet Take by mouth.     azelastine (ASTELIN) 0.1 % nasal spray Place into the nose.     No current facility-administered medications on file prior to visit.    There are no Patient Instructions on file for this visit. Return in about 1 year (around 11/26/2021) for EVAR and Femoral/Popliteal Aneurysms; See JD/FB.   Kris Hartmann, NP

## 2020-12-26 DIAGNOSIS — R220 Localized swelling, mass and lump, head: Secondary | ICD-10-CM | POA: Diagnosis not present

## 2020-12-31 DIAGNOSIS — D2372 Other benign neoplasm of skin of left lower limb, including hip: Secondary | ICD-10-CM | POA: Diagnosis not present

## 2021-01-09 DIAGNOSIS — E7800 Pure hypercholesterolemia, unspecified: Secondary | ICD-10-CM | POA: Diagnosis not present

## 2021-01-09 DIAGNOSIS — Z9989 Dependence on other enabling machines and devices: Secondary | ICD-10-CM | POA: Diagnosis not present

## 2021-01-09 DIAGNOSIS — G4733 Obstructive sleep apnea (adult) (pediatric): Secondary | ICD-10-CM | POA: Diagnosis not present

## 2021-01-09 DIAGNOSIS — I10 Essential (primary) hypertension: Secondary | ICD-10-CM | POA: Diagnosis not present

## 2021-01-20 DIAGNOSIS — Z85828 Personal history of other malignant neoplasm of skin: Secondary | ICD-10-CM | POA: Diagnosis not present

## 2021-01-20 DIAGNOSIS — X32XXXA Exposure to sunlight, initial encounter: Secondary | ICD-10-CM | POA: Diagnosis not present

## 2021-01-20 DIAGNOSIS — D2261 Melanocytic nevi of right upper limb, including shoulder: Secondary | ICD-10-CM | POA: Diagnosis not present

## 2021-01-20 DIAGNOSIS — D2271 Melanocytic nevi of right lower limb, including hip: Secondary | ICD-10-CM | POA: Diagnosis not present

## 2021-01-20 DIAGNOSIS — L570 Actinic keratosis: Secondary | ICD-10-CM | POA: Diagnosis not present

## 2021-01-20 DIAGNOSIS — D2272 Melanocytic nevi of left lower limb, including hip: Secondary | ICD-10-CM | POA: Diagnosis not present

## 2021-01-21 DIAGNOSIS — J301 Allergic rhinitis due to pollen: Secondary | ICD-10-CM | POA: Diagnosis not present

## 2021-01-21 DIAGNOSIS — J300 Vasomotor rhinitis: Secondary | ICD-10-CM | POA: Diagnosis not present

## 2021-01-21 DIAGNOSIS — G4733 Obstructive sleep apnea (adult) (pediatric): Secondary | ICD-10-CM | POA: Diagnosis not present

## 2021-01-27 DIAGNOSIS — Z20822 Contact with and (suspected) exposure to covid-19: Secondary | ICD-10-CM | POA: Diagnosis not present

## 2021-02-21 ENCOUNTER — Ambulatory Visit
Admission: EM | Admit: 2021-02-21 | Discharge: 2021-02-21 | Disposition: A | Discharge status: Home / Self Care | Payer: Medicare HMO | Attending: Emergency Medicine | Admitting: Emergency Medicine

## 2021-02-21 ENCOUNTER — Other Ambulatory Visit: Payer: Self-pay

## 2021-02-21 DIAGNOSIS — J3081 Allergic rhinitis due to animal (cat) (dog) hair and dander: Secondary | ICD-10-CM

## 2021-02-21 DIAGNOSIS — H1012 Acute atopic conjunctivitis, left eye: Secondary | ICD-10-CM

## 2021-02-21 MED ORDER — PREDNISONE 10 MG PO TABS: 20.0000 mg | ORAL_TABLET | Freq: Every day | ORAL | 0 refills | Status: AC

## 2021-02-21 NOTE — ED Triage Notes (Signed)
Pt reports bilateral eye drainage and sore discomfort x 5 days.

## 2021-02-21 NOTE — ED Provider Notes (Signed)
[facility]    CSN: [id number] Arrival date & time: 02/21/21  0932      History   Chief Complaint Chief Complaint  Patient presents with   Eye Drainage   Sore Throat         HPI Mitchell Powell is a 70 y.o. male.  Patient presents with left eye clear drainage and redness x 5 days.  He is currently on tobramycin eyedrops which were prescribed by his PCP on 02/18/2021.  He eye symptoms have improved but not resolved.  He still has clear eye drainage; decreased redness.  No eye pain or changes in vision.  He spent the holidays at a house cats and dogs; during that visit he developed runny nose, sneezing, scratchy throat but his symptoms have improved now.  He has been taking OTC allergy medication.  He denies fever, chills, rash, cough, shortness of breath, or other symptoms.  His medical history includes hypertension, AAA, COPD.  The history is provided by the patient and medical records.   Past Medical History:  Diagnosis Date   Anxiety    Depression    GERD (gastroesophageal reflux disease)    Hyperlipidemia    Hypertension    Hypothyroidism     Patient Active Problem List   Diagnosis Date Noted   Bradycardia 11/30/2018   Knee pain 11/22/2018   Acquired hypothyroidism 07/27/2018   Personal history of colonic polyps    Polyp of sigmoid colon    Chest pain with high risk for cardiac etiology 11/12/2017   GERD (gastroesophageal reflux disease) 10/27/2017   Hyperlipidemia 10/27/2017   Hypertension 10/27/2016   AAA (abdominal aortic aneurysm) without rupture 10/27/2016   Popliteal artery aneurysm (HCC) 10/27/2016   Femoral artery aneurysm, bilateral (HCC) 10/27/2016   OSA on CPAP 01/21/2016   SOB (shortness of breath) on exertion 10/31/2014   Anxiety disorder 10/25/2013   COPD (chronic obstructive pulmonary disease) ([place]) 10/25/2013   Depression 10/25/2013    Past Surgical History:  Procedure Laterality Date   ABDOMINAL AORTIC ANEURYSM REPAIR      COLONOSCOPY WITH PROPOFOL N/A 05/10/2018   Procedure: COLONOSCOPY WITH PROPOFOL;  Surgeon: [name], MD;  Location: [facility] ENDOSCOPY;  Service: Endoscopy;  Laterality: N/A;   TONSILLECTOMY AND ADENOIDECTOMY         Home Medications    Prior to Admission medications   Medication Sig Start Date End Date Taking? Authorizing Provider  predniSONE (DELTASONE) 10 MG tablet Take 2 tablets (20 mg total) by mouth daily for 5 days. 02/21/21 02/26/21 Yes Sharion Balloon, NP  azelastine (ASTELIN) 0.1 % nasal spray Place into the nose. 10/13/16 10/28/17  [provider]  benazepril (LOTENSIN) 20 MG tablet Take by mouth. 03/10/16   [provider]  clonazePAM (KLONOPIN) 0.5 MG tablet  09/15/16   [provider]  FLUoxetine (PROZAC) 20 MG capsule Take by mouth. 03/10/16   [provider]  fluticasone (FLONASE) 50 MCG/ACT nasal spray Place into the nose. 10/13/16   [provider]  levothyroxine (SYNTHROID, LEVOTHROID) 50 MCG tablet Take 50 mcg by mouth daily before breakfast.    [provider]  loratadine (CLARITIN) 10 MG tablet Take by mouth.    [provider]  Omega-3 Fatty Acids (FISH OIL) 1000 MG CPDR Take by mouth.    [provider]  omeprazole (PRILOSEC) 20 MG capsule  09/20/16   [provider]  pantoprazole (PROTONIX) 40 MG tablet  09/14/17   [provider]  pravastatin (  PRAVACHOL) 40 MG tablet Take by mouth. 03/10/16   [provider]    Family History Family History  Problem Relation Age of Onset   Varicose Veins Father    Heart attack Brother    Diabetes Brother     Social History Social History   Tobacco Use   Smoking status: Never   Smokeless tobacco: Never  Substance Use Topics   Alcohol use: Yes   Drug use: No     Allergies   Prednisone   Review of Systems Review of Systems  Constitutional:  Negative for chills and fever.  HENT:  Positive for rhinorrhea, sneezing and sore  throat. Negative for ear pain.   Eyes:  Positive for discharge and redness. Negative for pain and visual disturbance.  Respiratory:  Negative for cough and shortness of breath.   Cardiovascular:  Negative for chest pain and palpitations.  Skin:  Negative for color change and rash.  All other systems reviewed and are negative.   Physical Exam Triage Vital Signs ED Triage Vitals  Enc Vitals Group     BP 02/21/21 1045 131/83     Pulse Rate 02/21/21 1045 88     Resp 02/21/21 1045 17     Temp 02/21/21 1045 98.3 F (36.8 C)     Temp Source 02/21/21 1045 Oral     SpO2 02/21/21 1045 94 %     Weight --      Height --      Head Circumference --      Peak Flow --      Pain Score 02/21/21 1044 0     Pain Loc --      Pain Edu? --      Excl. in [place]? --    No data found.  Updated Vital Signs BP 131/83 (BP Location: Left Arm)    Pulse 88    Temp 98.3 F (36.8 C) (Oral)    Resp 17    SpO2 94%   Visual Acuity Right Eye Distance:   Left Eye Distance:   Bilateral Distance:    Right Eye Near:   Left Eye Near:    Bilateral Near:     Physical Exam Vitals and nursing note reviewed.  Constitutional:      General: He is not in acute distress.    Appearance: He is well-developed. He is not ill-appearing.  HENT:     Right Ear: Tympanic membrane normal.     Left Ear: Tympanic membrane normal.     Nose: Nose normal.     Mouth/Throat:     Mouth: Mucous membranes are moist.     Pharynx: Oropharynx is clear.  Eyes:     Extraocular Movements: Extraocular movements intact.     Pupils: Pupils are equal, round, and reactive to light.     Comments: Left eye mildly injected and tearing.    Cardiovascular:     Rate and Rhythm: Normal rate and regular rhythm.     Heart sounds: Normal heart sounds.  Pulmonary:     Effort: Pulmonary effort is normal. No respiratory distress.     Breath sounds: Normal breath sounds.  Musculoskeletal:     Cervical back: Neck supple.  Skin:    General: Skin is  warm and dry.  Neurological:     Mental Status: He is alert.  Psychiatric:        Mood and Affect: Mood normal.        Behavior: Behavior normal.  UC Treatments / Results  Labs (all labs ordered are listed, but only abnormal results are displayed) Labs Reviewed - No data to display  EKG   Radiology No results found.  Procedures Procedures (including critical care time)  Medications Ordered in UC Medications - No data to display  Initial Impression / Assessment and Plan / UC Course  I have reviewed the triage vital signs and the nursing notes.  Pertinent labs & imaging results that were available during my care of the patient were reviewed by me and considered in my medical decision making (see chart for details).  Allergic reaction to animal dander, allergic conjunctivitis of the left eye.  Patient is currently on tobramycin eye drops which were prescribed by his PCP.  His left eye continues to have some tearing but has improved in redness.  He has been taking OTC allergy medication which has improved his other symptoms.  Discussed treatment with prednisone; He states prednisone has caused irritability and nervousness in the past; Discussed low dose of 20 mg daily for 3 to 5 days; Patient agrees to try this.  Instructed patient to follow-up with his PCP if his symptoms are not improving; He states he has an appointment already scheduled on 02/28/2021.  He agrees to plan of care.   Final Clinical Impressions(s) / UC Diagnoses   Final diagnoses:  Allergic reaction to animal  Allergic conjunctivitis of left eye     Discharge Instructions      Continue taking Zyrtec or Benadryl as directed.  Continue using the eye drops as prescribed by your doctor.    Take the prednisone as directed.    Follow up with your primary care provider if your symptoms are not improving.         ED Prescriptions     Medication Sig Dispense Auth. Provider   predniSONE (DELTASONE) 10 MG  tablet Take 2 tablets (20 mg total) by mouth daily for 5 days. 10 tablet Sharion Balloon, NP      PDMP not reviewed this encounter.   Sharion Balloon, NP 02/21/21 1134

## 2021-02-21 NOTE — Discharge Instructions (Addendum)
Continue taking Zyrtec or Benadryl as directed.  Continue using the eye drops as prescribed by your doctor.    Take the prednisone as directed.    Follow up with your primary care provider if your symptoms are not improving.

## 2021-02-28 DIAGNOSIS — I10 Essential (primary) hypertension: Secondary | ICD-10-CM | POA: Diagnosis not present

## 2021-02-28 DIAGNOSIS — J449 Chronic obstructive pulmonary disease, unspecified: Secondary | ICD-10-CM | POA: Diagnosis not present

## 2021-02-28 DIAGNOSIS — Z9989 Dependence on other enabling machines and devices: Secondary | ICD-10-CM | POA: Diagnosis not present

## 2021-02-28 DIAGNOSIS — F411 Generalized anxiety disorder: Secondary | ICD-10-CM | POA: Diagnosis not present

## 2021-02-28 DIAGNOSIS — G4733 Obstructive sleep apnea (adult) (pediatric): Secondary | ICD-10-CM | POA: Diagnosis not present

## 2021-02-28 DIAGNOSIS — Z79899 Other long term (current) drug therapy: Secondary | ICD-10-CM | POA: Diagnosis not present

## 2021-02-28 DIAGNOSIS — E039 Hypothyroidism, unspecified: Secondary | ICD-10-CM | POA: Diagnosis not present

## 2021-02-28 DIAGNOSIS — E782 Mixed hyperlipidemia: Secondary | ICD-10-CM | POA: Diagnosis not present

## 2021-03-11 DIAGNOSIS — H524 Presbyopia: Secondary | ICD-10-CM | POA: Diagnosis not present

## 2021-03-11 DIAGNOSIS — Z0100 Encounter for examination of eyes and vision without abnormal findings: Secondary | ICD-10-CM | POA: Diagnosis not present

## 2021-03-27 DIAGNOSIS — T7840XA Allergy, unspecified, initial encounter: Secondary | ICD-10-CM | POA: Diagnosis not present

## 2021-04-01 DIAGNOSIS — L239 Allergic contact dermatitis, unspecified cause: Secondary | ICD-10-CM | POA: Diagnosis not present

## 2021-05-12 DIAGNOSIS — Z6828 Body mass index (BMI) 28.0-28.9, adult: Secondary | ICD-10-CM | POA: Diagnosis not present

## 2021-05-12 DIAGNOSIS — I10 Essential (primary) hypertension: Secondary | ICD-10-CM | POA: Diagnosis not present

## 2021-05-12 DIAGNOSIS — M47816 Spondylosis without myelopathy or radiculopathy, lumbar region: Secondary | ICD-10-CM | POA: Diagnosis not present

## 2021-05-12 DIAGNOSIS — M48061 Spinal stenosis, lumbar region without neurogenic claudication: Secondary | ICD-10-CM | POA: Diagnosis not present

## 2021-05-14 DIAGNOSIS — M47816 Spondylosis without myelopathy or radiculopathy, lumbar region: Secondary | ICD-10-CM | POA: Diagnosis not present

## 2021-05-16 DIAGNOSIS — M47816 Spondylosis without myelopathy or radiculopathy, lumbar region: Secondary | ICD-10-CM | POA: Diagnosis not present

## 2021-05-21 DIAGNOSIS — M47816 Spondylosis without myelopathy or radiculopathy, lumbar region: Secondary | ICD-10-CM | POA: Diagnosis not present

## 2021-05-26 DIAGNOSIS — M47816 Spondylosis without myelopathy or radiculopathy, lumbar region: Secondary | ICD-10-CM | POA: Diagnosis not present

## 2021-05-28 DIAGNOSIS — M47816 Spondylosis without myelopathy or radiculopathy, lumbar region: Secondary | ICD-10-CM | POA: Diagnosis not present

## 2021-06-07 DIAGNOSIS — R350 Frequency of micturition: Secondary | ICD-10-CM | POA: Diagnosis not present

## 2021-06-19 DIAGNOSIS — M47816 Spondylosis without myelopathy or radiculopathy, lumbar region: Secondary | ICD-10-CM | POA: Diagnosis not present

## 2021-06-24 DIAGNOSIS — D2261 Melanocytic nevi of right upper limb, including shoulder: Secondary | ICD-10-CM | POA: Diagnosis not present

## 2021-06-24 DIAGNOSIS — D2262 Melanocytic nevi of left upper limb, including shoulder: Secondary | ICD-10-CM | POA: Diagnosis not present

## 2021-06-24 DIAGNOSIS — L309 Dermatitis, unspecified: Secondary | ICD-10-CM | POA: Diagnosis not present

## 2021-06-24 DIAGNOSIS — D2271 Melanocytic nevi of right lower limb, including hip: Secondary | ICD-10-CM | POA: Diagnosis not present

## 2021-06-24 DIAGNOSIS — D2272 Melanocytic nevi of left lower limb, including hip: Secondary | ICD-10-CM | POA: Diagnosis not present

## 2021-06-24 DIAGNOSIS — L258 Unspecified contact dermatitis due to other agents: Secondary | ICD-10-CM | POA: Diagnosis not present

## 2021-06-24 DIAGNOSIS — L821 Other seborrheic keratosis: Secondary | ICD-10-CM | POA: Diagnosis not present

## 2021-06-24 DIAGNOSIS — D225 Melanocytic nevi of trunk: Secondary | ICD-10-CM | POA: Diagnosis not present

## 2021-06-30 DIAGNOSIS — Z79899 Other long term (current) drug therapy: Secondary | ICD-10-CM | POA: Diagnosis not present

## 2021-06-30 DIAGNOSIS — F411 Generalized anxiety disorder: Secondary | ICD-10-CM | POA: Diagnosis not present

## 2021-06-30 DIAGNOSIS — E079 Disorder of thyroid, unspecified: Secondary | ICD-10-CM | POA: Diagnosis not present

## 2021-06-30 DIAGNOSIS — Z0000 Encounter for general adult medical examination without abnormal findings: Secondary | ICD-10-CM | POA: Diagnosis not present

## 2021-06-30 DIAGNOSIS — Z125 Encounter for screening for malignant neoplasm of prostate: Secondary | ICD-10-CM | POA: Diagnosis not present

## 2021-06-30 DIAGNOSIS — I10 Essential (primary) hypertension: Secondary | ICD-10-CM | POA: Diagnosis not present

## 2021-06-30 DIAGNOSIS — E785 Hyperlipidemia, unspecified: Secondary | ICD-10-CM | POA: Diagnosis not present

## 2021-07-05 ENCOUNTER — Ambulatory Visit
Admission: EM | Admit: 2021-07-05 | Discharge: 2021-07-05 | Disposition: A | Discharge status: Home / Self Care | Payer: Medicare HMO

## 2021-07-05 ENCOUNTER — Encounter: Payer: Self-pay | Admitting: Emergency Medicine

## 2021-07-05 DIAGNOSIS — J449 Chronic obstructive pulmonary disease, unspecified: Secondary | ICD-10-CM | POA: Diagnosis not present

## 2021-07-05 DIAGNOSIS — R21 Rash and other nonspecific skin eruption: Secondary | ICD-10-CM | POA: Diagnosis not present

## 2021-07-05 MED ORDER — PREDNISONE 10 MG PO TABS
20.0000 mg | ORAL_TABLET | Freq: Every day | ORAL | 0 refills | Status: AC
Start: 2021-07-05 — End: 2021-07-10

## 2021-07-05 MED ORDER — TRIAMCINOLONE ACETONIDE 0.1 % EX CREA: 1.0000 "application " | TOPICAL_CREAM | Freq: Two times a day (BID) | CUTANEOUS | 0 refills | Status: DC

## 2021-07-05 NOTE — ED Triage Notes (Signed)
Patient c/o generalized rash x 3 months.  ? ?Patient endorses itchiness in one area but not at all sites of rash.  ? ?Patient has rash present on torso, both arms, and both legs.  ? ?Patient went to Dermatologist and they took a biopsy, patient stated " she thought it may be a drug reaction to something".  ? ?Patient has used Hydroxyzine, Triamcinolone, and Cortisone with no improvement.  ?

## 2021-07-05 NOTE — Discharge Instructions (Addendum)
Take the prednisone and use the triamcinolone as directed.  Follow-up with your dermatologist on Monday. ?

## 2021-07-05 NOTE — ED Provider Notes (Signed)
?[facility] ? ? ? ?CSN: [id number] ?Arrival date & time: 07/05/21  1141 ? ? ?  ? ?History   ?Chief Complaint ?Chief Complaint  ?Patient presents with  ? Rash  ? ? ?HPI ?Mitchell Powell is a 71 y.o. male.  Patient presents with rash on trunk and extremities x3 months.  Some areas on his arms are pruritic.  He reports he has seen dermatologist twice for this rash.  The rash waxes and wanes in intensity but has not fully resolved.  He denies fever or other symptoms.  He was seen by [facility] clinic on 03/27/2021; diagnosed with allergic reaction to drug; treated with prednisone.  He was seen by his PCP on 06/30/2021 for annual exam. ? ?The history is provided by the patient and medical records.  ? ?Past Medical History:  ?Diagnosis Date  ? Anxiety   ? Depression   ? GERD (gastroesophageal reflux disease)   ? Hyperlipidemia   ? Hypertension   ? Hypothyroidism   ? ? ?Patient Active Problem List  ? Diagnosis Date Noted  ? Bradycardia 11/30/2018  ? Knee pain 11/22/2018  ? Acquired hypothyroidism 07/27/2018  ? Personal history of colonic polyps   ? Polyp of sigmoid colon   ? Chest pain with high risk for cardiac etiology 11/12/2017  ? GERD (gastroesophageal reflux disease) 10/27/2017  ? Hyperlipidemia 10/27/2017  ? Hypertension 10/27/2016  ? AAA (abdominal aortic aneurysm) without rupture ([place]) 10/27/2016  ? Popliteal artery aneurysm ([place]) 10/27/2016  ? Femoral artery aneurysm, bilateral ([place]) 10/27/2016  ? OSA on CPAP 01/21/2016  ? SOB (shortness of breath) on exertion 10/31/2014  ? Anxiety disorder 10/25/2013  ? COPD (chronic obstructive pulmonary disease) ([place]) 10/25/2013  ? Depression 10/25/2013  ? ? ?Past Surgical History:  ?Procedure Laterality Date  ? ABDOMINAL AORTIC ANEURYSM REPAIR    ? COLONOSCOPY WITH PROPOFOL N/A 05/10/2018  ? Procedure: COLONOSCOPY WITH PROPOFOL;  Surgeon: [name], MD;  Location: [facility] ENDOSCOPY;  Service: Endoscopy;  Laterality: N/A;  ? TONSILLECTOMY AND ADENOIDECTOMY    ? ? ? ? ? ?Home  Medications   ? ?Prior to Admission medications   ?Medication Sig Start Date End Date Taking? Authorizing Provider  ?azithromycin (ZITHROMAX) 250 MG tablet 2 tabs on day one, then 1 tab daily x 4 days 07/02/21 07/07/21 Yes [provider]  ?benazepril (LOTENSIN) 20 MG tablet Take by mouth. 03/10/16  Yes [provider]  ?clonazePAM (KLONOPIN) 0.5 MG tablet  09/15/16  Yes [provider]  ?doxazosin (CARDURA) 2 MG tablet Take 2 mg by mouth daily. 06/30/21  Yes [provider]  ?FLUoxetine (PROZAC) 20 MG capsule Take by mouth. 03/10/16  Yes [provider]  ?fluticasone (FLONASE) 50 MCG/ACT nasal spray Place into the nose. 10/13/16  Yes [provider]  ?levothyroxine (SYNTHROID, LEVOTHROID) 50 MCG tablet Take 50 mcg by mouth daily before breakfast.   Yes [provider]  ?loratadine (CLARITIN) 10 MG tablet Take by mouth.   Yes [provider]  ?Omega-3 Fatty Acids (FISH OIL) 1000 MG CPDR Take by mouth.   Yes [provider]  ?omeprazole (PRILOSEC) 20 MG capsule  09/20/16  Yes [provider]  ?pantoprazole (PROTONIX) 40 MG tablet  09/14/17  Yes [provider]  ?pravastatin (PRAVACHOL) 40 MG tablet Take by mouth. 03/10/16  Yes [provider]  ?azelastine (ASTELIN) 0.1 % nasal spray Place into the nose. 10/13/16 10/28/17  [provider]  ?predniSONE (DELTASONE) 10 MG tablet Take 2 tablets (  20 mg total) by mouth daily with breakfast for 5 days. 07/05/21 07/10/21 Yes Sharion Balloon, NP  ?triamcinolone cream (KENALOG) 0.1 % Apply 1 application. topically 2 (two) times daily. 07/05/21  Yes Sharion Balloon, NP  ? ? ?Family History ?Family History  ?Problem Relation Age of Onset  ? Varicose Veins Father   ? Heart attack Brother   ? Diabetes Brother   ? ? ?Social History ?Social History  ? ?Tobacco Use  ? Smoking status: Never  ? Smokeless tobacco: Never  ?Substance Use Topics  ? Alcohol use: Yes  ? Drug use: No   ? ? ? ?Allergies   ?Prednisone ? ? ?Review of Systems ?Review of Systems  ?Constitutional:  Negative for chills and fever.  ?HENT:  Negative for ear pain and sore throat.   ?Respiratory:  Negative for cough and shortness of breath.   ?Skin:  Positive for color change and rash.  ?All other systems reviewed and are negative. ? ? ?Physical Exam ?Triage Vital Signs ?ED Triage Vitals  ?Enc Vitals Group  ?   BP 07/05/21 1149 134/70  ?   Pulse Rate 07/05/21 1149 (!) 57  ?   Resp 07/05/21 1149 12  ?   Temp 07/05/21 1149 98 ?F (36.7 ?C)  ?   Temp Source 07/05/21 1149 Oral  ?   SpO2 07/05/21 1149 96 %  ?   Weight --   ?   Height --   ?   Head Circumference --   ?   Peak Flow --   ?   Pain Score 07/05/21 1153 0  ?   Pain Loc --   ?   Pain Edu? --   ?   Excl. in [place]? --   ? ?No data found. ? ?Updated Vital Signs ?BP 134/70 (BP Location: Left Arm)   Pulse (!) 57   Temp 98 ?F (36.7 ?C) (Oral)   Resp 12   SpO2 96%  ? ?Visual Acuity ?Right Eye Distance:   ?Left Eye Distance:   ?Bilateral Distance:   ? ?Right Eye Near:   ?Left Eye Near:    ?Bilateral Near:    ? ?Physical Exam ?Vitals and nursing note reviewed.  ?Constitutional:   ?   General: He is not in acute distress. ?   Appearance: He is well-developed.  ?HENT:  ?   Mouth/Throat:  ?   Mouth: Mucous membranes are moist.  ?Cardiovascular:  ?   Rate and Rhythm: Normal rate and regular rhythm.  ?Pulmonary:  ?   Effort: Pulmonary effort is normal. No respiratory distress.  ?Musculoskeletal:  ?   Cervical back: Neck supple.  ?Skin: ?   General: Skin is warm and dry.  ?   Findings: Rash present.  ?   Comments: Rash on trunk and extremities.  See pictures for details.   ?Neurological:  ?   Mental Status: He is alert.  ?Psychiatric:     ?   Mood and Affect: Mood normal.     ?   Behavior: Behavior normal.  ? ? ? ? ? ? ? ? ?UC Treatments / Results  ?Labs ?(all labs ordered are listed, but only abnormal results are displayed) ?Labs Reviewed - No data to  display ? ?EKG ? ? ?Radiology ?No results found. ? ?Procedures ?Procedures (including critical care time) ? ?Medications Ordered in UC ?Medications - No data to display ? ?Initial Impression / Assessment and Plan / UC Course  ?I have reviewed the triage  vital signs and the nursing notes. ? ?Pertinent labs & imaging results that were available during my care of the patient were reviewed by me and considered in my medical decision making (see chart for details). ? ? Rash.  The rash has been present for several months.  Patient has seen dermatology twice for this.  Treating today with prednisone and triamcinolone cream.  Instructed patient to call his dermatologist on Monday to schedule soonest available appointment.  He agrees to plan of care. ? ? ?Final Clinical Impressions(s) / UC Diagnoses  ? ?Final diagnoses:  ?Rash  ? ? ? ?Discharge Instructions   ? ?  ?Take the prednisone and use the triamcinolone as directed.  Follow-up with your dermatologist on Monday. ? ? ? ? ?ED Prescriptions   ? ? Medication Sig Dispense Auth. Provider  ? predniSONE (DELTASONE) 10 MG tablet Take 2 tablets (20 mg total) by mouth daily with breakfast for 5 days. 10 tablet Sharion Balloon, NP  ? triamcinolone cream (KENALOG) 0.1 % Apply 1 application. topically 2 (two) times daily. 80 g Sharion Balloon, NP  ? ?  ? ?PDMP not reviewed this encounter. ?  ?Sharion Balloon, NP ?07/05/21 1252 ? ?

## 2021-07-08 DIAGNOSIS — Z9989 Dependence on other enabling machines and devices: Secondary | ICD-10-CM | POA: Diagnosis not present

## 2021-07-08 DIAGNOSIS — G4733 Obstructive sleep apnea (adult) (pediatric): Secondary | ICD-10-CM | POA: Diagnosis not present

## 2021-07-08 DIAGNOSIS — E7800 Pure hypercholesterolemia, unspecified: Secondary | ICD-10-CM | POA: Diagnosis not present

## 2021-07-08 DIAGNOSIS — R21 Rash and other nonspecific skin eruption: Secondary | ICD-10-CM | POA: Insufficient documentation

## 2021-07-08 DIAGNOSIS — R001 Bradycardia, unspecified: Secondary | ICD-10-CM | POA: Diagnosis not present

## 2021-07-08 DIAGNOSIS — I10 Essential (primary) hypertension: Secondary | ICD-10-CM | POA: Diagnosis not present

## 2021-07-10 DIAGNOSIS — M47816 Spondylosis without myelopathy or radiculopathy, lumbar region: Secondary | ICD-10-CM | POA: Diagnosis not present

## 2021-07-11 DIAGNOSIS — L258 Unspecified contact dermatitis due to other agents: Secondary | ICD-10-CM | POA: Diagnosis not present

## 2021-07-11 DIAGNOSIS — L309 Dermatitis, unspecified: Secondary | ICD-10-CM | POA: Diagnosis not present

## 2021-08-12 DIAGNOSIS — M47816 Spondylosis without myelopathy or radiculopathy, lumbar region: Secondary | ICD-10-CM | POA: Diagnosis not present

## 2021-09-10 DIAGNOSIS — I10 Essential (primary) hypertension: Secondary | ICD-10-CM | POA: Diagnosis not present

## 2021-09-10 DIAGNOSIS — R1013 Epigastric pain: Secondary | ICD-10-CM | POA: Diagnosis not present

## 2021-09-12 ENCOUNTER — Encounter: Payer: Self-pay | Admitting: Urology

## 2021-09-12 ENCOUNTER — Ambulatory Visit: Payer: Medicare HMO | Admitting: Urology

## 2021-09-12 VITALS — BP 119/62 | HR 56 | Ht 72.0 in | Wt 198.0 lb

## 2021-09-12 DIAGNOSIS — N5201 Erectile dysfunction due to arterial insufficiency: Secondary | ICD-10-CM

## 2021-09-12 DIAGNOSIS — N401 Enlarged prostate with lower urinary tract symptoms: Secondary | ICD-10-CM

## 2021-09-12 LAB — URINALYSIS, COMPLETE
Bilirubin, UA: NEGATIVE
Glucose, UA: NEGATIVE
Ketones, UA: NEGATIVE
Leukocytes,UA: NEGATIVE
Nitrite, UA: NEGATIVE
Protein,UA: NEGATIVE
RBC, UA: NEGATIVE
Specific Gravity, UA: <1.005 — ABNORMAL LOW (ref 1.005–1.030)
Urobilinogen, Ur: 0.2 mg/dL (ref 0.2–1.0)
pH, UA: 5 (ref 5.0–7.5)

## 2021-09-12 LAB — MICROSCOPIC EXAMINATION: Bacteria, UA: NONE SEEN

## 2021-09-12 LAB — BLADDER SCAN AMB NON-IMAGING: Scan Result: 43

## 2021-09-12 MED ORDER — TADALAFIL 20 MG PO TABS: 20.0000 mg | ORAL_TABLET | Freq: Every day | ORAL | 0 refills | Status: DC | PRN

## 2021-09-12 NOTE — Progress Notes (Signed)
09/12/2021 12:13 PM   Mitchell Powell 1950/11/24 [id number]  Referring provider: Idelle Crouch, MD [facility] [place],  [place] [id number]  Chief Complaint  Patient presents with   Benign Prostatic Hypertrophy    Urologic history: 1.  BPH with LUTS Tamsulosin Cystoscopy trilobar enlargement with intravesical median lobe and intravesical lateral lobe extension   HPI: 71 y.o. male presents for annual follow-up  Initially referred last year for bladder mass on renal ultrasound which on cystoscopy was intravesical prostate tissue. Was previously placed on doxazosin by Dr. Doy Hutching.  He developed a rash on this medication and was subsequently switched to tamsulosin Stable lower urinary tract symptoms on tamsulosin IPSS today 13/35 with QoL rated 2/6 New complaint of ED today with difficulty achieving and maintaining an erection Organic risk factors include hypertension, hyperlipidemia and antihypertensive medication PSA 06/30/2021 1.51   PMH: Past Medical History:  Diagnosis Date   Anxiety    Depression    GERD (gastroesophageal reflux disease)    Hyperlipidemia    Hypertension    Hypothyroidism     Surgical History: Past Surgical History:  Procedure Laterality Date   ABDOMINAL AORTIC ANEURYSM REPAIR     COLONOSCOPY WITH PROPOFOL N/A 05/10/2018   Procedure: COLONOSCOPY WITH PROPOFOL;  Surgeon: [name], MD;  Location: [facility] ENDOSCOPY;  Service: Endoscopy;  Laterality: N/A;   TONSILLECTOMY AND ADENOIDECTOMY      Home Medications:  Allergies as of 09/12/2021       Reactions   Prednisone    Other reaction(s): Other (See Comments) irritiability        Medication List        Accurate as of September 12, 2021 12:13 PM. If you have any questions, ask your nurse or doctor.          STOP taking these medications    azelastine 0.1 % nasal spray Commonly known as: ASTELIN Stopped by: Abbie Sons, MD   doxazosin 2 MG  tablet Commonly known as: CARDURA Stopped by: Abbie Sons, MD       TAKE these medications    benazepril 20 MG tablet Commonly known as: LOTENSIN Take by mouth.   clonazePAM 0.5 MG tablet Commonly known as: KLONOPIN   Fish Oil 1000 MG Cpdr Take by mouth.   FLUoxetine 20 MG capsule Commonly known as: PROZAC Take by mouth.   fluticasone 50 MCG/ACT nasal spray Commonly known as: FLONASE Place into the nose.   hydrOXYzine 25 MG capsule Commonly known as: VISTARIL Take by mouth.   levothyroxine 50 MCG tablet Commonly known as: SYNTHROID Take 50 mcg by mouth daily before breakfast.   loratadine 10 MG tablet Commonly known as: CLARITIN Take by mouth.   losartan 50 MG tablet Commonly known as: COZAAR Take 50 mg by mouth daily.   omeprazole 20 MG capsule Commonly known as: PRILOSEC   pantoprazole 40 MG tablet Commonly known as: PROTONIX   pravastatin 40 MG tablet Commonly known as: PRAVACHOL Take by mouth.   sucralfate 1 g tablet Commonly known as: CARAFATE Take 1 g by mouth 2 (two) times daily.   tamsulosin 0.4 MG Caps capsule Commonly known as: FLOMAX Take by mouth.   triamcinolone cream 0.1 % Commonly known as: KENALOG Apply 1 application. topically 2 (two) times daily.        Allergies:  Allergies  Allergen Reactions   Prednisone     Other reaction(s): Other (See Comments) irritiability    Family History:  Family History  Problem Relation Age of Onset   Varicose Veins Father    Heart attack Brother    Diabetes Brother     Social History:  reports that he has never smoked. He has never used smokeless tobacco. He reports current alcohol use. He reports that he does not use drugs.   Physical Exam: BP 119/62   Pulse (!) 56   Ht 6' (1.829 m)   Wt 198 lb (89.8 kg)   BMI 26.85 kg/m   Constitutional:  Alert and oriented, No acute distress. HEENT: [place] AT Psychiatric: Normal mood and affect.   Assessment & Plan:    1. Benign  prostatic hyperplasia with LUTS  Stable on tamsulosin PVR 43 mL Continue annual follow-up  2.  Erectile dysfunction Has tried tadalafil in the past-had mild headache Was interested in retrying and Rx sent to [facility], [place] [address] [place], [place] [address] [phone]

## 2021-09-19 DIAGNOSIS — J029 Acute pharyngitis, unspecified: Secondary | ICD-10-CM | POA: Diagnosis not present

## 2021-09-19 DIAGNOSIS — Z03818 Encounter for observation for suspected exposure to other biological agents ruled out: Secondary | ICD-10-CM | POA: Diagnosis not present

## 2021-09-19 DIAGNOSIS — J0110 Acute frontal sinusitis, unspecified: Secondary | ICD-10-CM | POA: Diagnosis not present

## 2021-09-30 DIAGNOSIS — J019 Acute sinusitis, unspecified: Secondary | ICD-10-CM | POA: Diagnosis not present

## 2021-09-30 DIAGNOSIS — H6692 Otitis media, unspecified, left ear: Secondary | ICD-10-CM | POA: Diagnosis not present

## 2021-10-07 DIAGNOSIS — H6982 Other specified disorders of Eustachian tube, left ear: Secondary | ICD-10-CM | POA: Diagnosis not present

## 2021-10-28 DIAGNOSIS — E039 Hypothyroidism, unspecified: Secondary | ICD-10-CM | POA: Diagnosis not present

## 2021-11-03 DIAGNOSIS — E039 Hypothyroidism, unspecified: Secondary | ICD-10-CM | POA: Diagnosis not present

## 2021-11-03 DIAGNOSIS — R059 Cough, unspecified: Secondary | ICD-10-CM | POA: Diagnosis not present

## 2021-11-03 DIAGNOSIS — E7800 Pure hypercholesterolemia, unspecified: Secondary | ICD-10-CM | POA: Diagnosis not present

## 2021-11-03 DIAGNOSIS — F411 Generalized anxiety disorder: Secondary | ICD-10-CM | POA: Diagnosis not present

## 2021-11-03 DIAGNOSIS — R053 Chronic cough: Secondary | ICD-10-CM | POA: Diagnosis not present

## 2021-11-03 DIAGNOSIS — I10 Essential (primary) hypertension: Secondary | ICD-10-CM | POA: Diagnosis not present

## 2021-11-03 DIAGNOSIS — Z79899 Other long term (current) drug therapy: Secondary | ICD-10-CM | POA: Diagnosis not present

## 2021-11-07 DIAGNOSIS — H90A22 Sensorineural hearing loss, unilateral, left ear, with restricted hearing on the contralateral side: Secondary | ICD-10-CM | POA: Diagnosis not present

## 2021-11-07 DIAGNOSIS — H903 Sensorineural hearing loss, bilateral: Secondary | ICD-10-CM | POA: Diagnosis not present

## 2021-11-07 DIAGNOSIS — J301 Allergic rhinitis due to pollen: Secondary | ICD-10-CM | POA: Diagnosis not present

## 2021-11-10 ENCOUNTER — Other Ambulatory Visit: Payer: Self-pay | Admitting: Otolaryngology

## 2021-11-10 DIAGNOSIS — H9122 Sudden idiopathic hearing loss, left ear: Secondary | ICD-10-CM

## 2021-11-11 DIAGNOSIS — Z85828 Personal history of other malignant neoplasm of skin: Secondary | ICD-10-CM | POA: Diagnosis not present

## 2021-11-11 DIAGNOSIS — D2272 Melanocytic nevi of left lower limb, including hip: Secondary | ICD-10-CM | POA: Diagnosis not present

## 2021-11-11 DIAGNOSIS — D2261 Melanocytic nevi of right upper limb, including shoulder: Secondary | ICD-10-CM | POA: Diagnosis not present

## 2021-11-11 DIAGNOSIS — L270 Generalized skin eruption due to drugs and medicaments taken internally: Secondary | ICD-10-CM | POA: Diagnosis not present

## 2021-11-11 DIAGNOSIS — L570 Actinic keratosis: Secondary | ICD-10-CM | POA: Diagnosis not present

## 2021-11-11 DIAGNOSIS — D2262 Melanocytic nevi of left upper limb, including shoulder: Secondary | ICD-10-CM | POA: Diagnosis not present

## 2021-11-18 ENCOUNTER — Ambulatory Visit
Admission: RE | Admit: 2021-11-18 | Discharge: 2021-11-18 | Disposition: A | Discharge status: Home / Self Care | Payer: Medicare HMO | Source: Ambulatory Visit | Attending: Otolaryngology | Admitting: Otolaryngology

## 2021-11-18 DIAGNOSIS — H9122 Sudden idiopathic hearing loss, left ear: Secondary | ICD-10-CM | POA: Diagnosis not present

## 2021-11-18 DIAGNOSIS — I6782 Cerebral ischemia: Secondary | ICD-10-CM | POA: Diagnosis not present

## 2021-11-18 DIAGNOSIS — D170 Benign lipomatous neoplasm of skin and subcutaneous tissue of head, face and neck: Secondary | ICD-10-CM | POA: Diagnosis not present

## 2021-11-18 DIAGNOSIS — M47812 Spondylosis without myelopathy or radiculopathy, cervical region: Secondary | ICD-10-CM | POA: Diagnosis not present

## 2021-11-18 MED ORDER — GADOBENATE DIMEGLUMINE 529 MG/ML IV SOLN
18.0000 mL | Freq: Once | INTRAVENOUS | Status: AC | PRN
  Administered 2021-11-18: 18 mL via INTRAVENOUS

## 2021-11-25 ENCOUNTER — Other Ambulatory Visit (INDEPENDENT_AMBULATORY_CARE_PROVIDER_SITE_OTHER): Payer: Medicare HMO

## 2021-11-25 ENCOUNTER — Ambulatory Visit (INDEPENDENT_AMBULATORY_CARE_PROVIDER_SITE_OTHER): Payer: Medicare HMO | Admitting: Vascular Surgery

## 2021-11-25 ENCOUNTER — Encounter (INDEPENDENT_AMBULATORY_CARE_PROVIDER_SITE_OTHER): Payer: Medicare HMO

## 2021-12-08 DIAGNOSIS — R198 Other specified symptoms and signs involving the digestive system and abdomen: Secondary | ICD-10-CM | POA: Diagnosis not present

## 2021-12-08 DIAGNOSIS — R1013 Epigastric pain: Secondary | ICD-10-CM | POA: Diagnosis not present

## 2021-12-09 DIAGNOSIS — H9122 Sudden idiopathic hearing loss, left ear: Secondary | ICD-10-CM | POA: Diagnosis not present

## 2021-12-11 ENCOUNTER — Other Ambulatory Visit (INDEPENDENT_AMBULATORY_CARE_PROVIDER_SITE_OTHER): Payer: Self-pay | Admitting: Vascular Surgery

## 2021-12-11 DIAGNOSIS — I714 Abdominal aortic aneurysm, without rupture, unspecified: Secondary | ICD-10-CM

## 2021-12-11 DIAGNOSIS — I724 Aneurysm of artery of lower extremity: Secondary | ICD-10-CM

## 2021-12-12 ENCOUNTER — Ambulatory Visit (INDEPENDENT_AMBULATORY_CARE_PROVIDER_SITE_OTHER): Payer: Medicare HMO

## 2021-12-12 ENCOUNTER — Ambulatory Visit (INDEPENDENT_AMBULATORY_CARE_PROVIDER_SITE_OTHER): Payer: Medicare HMO | Admitting: Vascular Surgery

## 2021-12-12 ENCOUNTER — Encounter (INDEPENDENT_AMBULATORY_CARE_PROVIDER_SITE_OTHER): Payer: Self-pay | Admitting: Vascular Surgery

## 2021-12-12 VITALS — BP 137/76 | HR 65 | Resp 18 | Ht 72.0 in | Wt 199.2 lb

## 2021-12-12 DIAGNOSIS — I724 Aneurysm of artery of lower extremity: Secondary | ICD-10-CM

## 2021-12-12 DIAGNOSIS — I714 Abdominal aortic aneurysm, without rupture, unspecified: Secondary | ICD-10-CM | POA: Diagnosis not present

## 2021-12-12 DIAGNOSIS — I10 Essential (primary) hypertension: Secondary | ICD-10-CM

## 2021-12-12 DIAGNOSIS — I7143 Infrarenal abdominal aortic aneurysm, without rupture: Secondary | ICD-10-CM | POA: Diagnosis not present

## 2021-12-12 DIAGNOSIS — E782 Mixed hyperlipidemia: Secondary | ICD-10-CM

## 2021-12-12 NOTE — Progress Notes (Signed)
MRN : [id number]  Mitchell Powell is a 71 y.o. (December 24, 1950) male who presents with chief complaint of No chief complaint on file. Marland Kitchen  History of Present Illness: Patient returns today in follow up of multiple vascular issues of aneurysms in multiple sites.  He is doing well.  He has no new complaints today.  No aneurysm related symptoms. Specifically, the patient denies new back or abdominal pain, or signs of peripheral embolization.  He stopped smoking several years ago.  He is about 10 years status post stent graft repair for his abdominal aortic aneurysm.  Duplex today shows this to be under 3 cm in maximal diameter without an endoleak. He also has known popliteal and common femoral artery aneurysms.  Duplex was done on both.  For the popliteal arteries, Maximal diameter was 1.1 cm on the right and 1.0 cm on the left.  For the femoral arteries, maximal diameter is 1.5 cm on the right and 1.6 cm on the left which was also stable.  Current Outpatient Medications  Medication Sig Dispense Refill   benazepril (LOTENSIN) 20 MG tablet Take by mouth.     clonazePAM (KLONOPIN) 0.5 MG tablet      FLUoxetine (PROZAC) 20 MG capsule Take by mouth.     fluticasone (FLONASE) 50 MCG/ACT nasal spray Place into the nose.     levothyroxine (SYNTHROID, LEVOTHROID) 50 MCG tablet Take 50 mcg by mouth daily before breakfast.     loratadine (CLARITIN) 10 MG tablet Take by mouth.     losartan (COZAAR) 50 MG tablet Take 50 mg by mouth daily.     Omega-3 Fatty Acids (FISH OIL) 1000 MG CPDR Take by mouth.     omeprazole (PRILOSEC) 20 MG capsule      pantoprazole (PROTONIX) 40 MG tablet      pravastatin (PRAVACHOL) 40 MG tablet Take by mouth.     tadalafil (CIALIS) 20 MG tablet Take 1 tablet (20 mg total) by mouth daily as needed for erectile dysfunction. 10 tablet 0   tamsulosin (FLOMAX) 0.4 MG CAPS capsule Take by mouth.     No current facility-administered medications for this visit.    Past Medical History:   Diagnosis Date   Anxiety    Depression    GERD (gastroesophageal reflux disease)    Hyperlipidemia    Hypertension    Hypothyroidism     Past Surgical History:  Procedure Laterality Date   ABDOMINAL AORTIC ANEURYSM REPAIR     COLONOSCOPY WITH PROPOFOL N/A 05/10/2018   Procedure: COLONOSCOPY WITH PROPOFOL;  Surgeon: [name], MD;  Location: [facility] ENDOSCOPY;  Service: Endoscopy;  Laterality: N/A;   TONSILLECTOMY AND ADENOIDECTOMY       Social History   Tobacco Use   Smoking status: Never   Smokeless tobacco: Never  Substance Use Topics   Alcohol use: Yes   Drug use: No       Family History  Problem Relation Age of Onset   Varicose Veins Father    Heart attack Brother    Diabetes Brother      Allergies  Allergen Reactions   Prednisone     Other reaction(s): Other (See Comments) irritiability     REVIEW OF SYSTEMS (Negative unless checked)   Constitutional: '[]'$ Weight loss  '[]'$ Fever  '[]'$ Chills Cardiac: '[]'$ Chest pain   '[]'$ Chest pressure   '[]'$ Palpitations   '[]'$ Shortness of breath when laying flat   '[]'$ Shortness of breath at rest   '[]'$ Shortness of breath with exertion. Vascular:  '[]'$ Pain  in legs with walking   '[]'$ Pain in legs at rest   '[]'$ Pain in legs when laying flat   '[]'$ Claudication   '[]'$ Pain in feet when walking  '[]'$ Pain in feet at rest  '[]'$ Pain in feet when laying flat   '[]'$ History of DVT   '[]'$ Phlebitis   '[]'$ Swelling in legs   '[]'$ Varicose veins   '[]'$ Non-healing ulcers Pulmonary:   '[]'$ Uses home oxygen   '[]'$ Productive cough   '[]'$ Hemoptysis   '[]'$ Wheeze  '[]'$ COPD   '[]'$ Asthma Neurologic:  '[]'$ Dizziness  '[]'$ Blackouts   '[]'$ Seizures   '[]'$ History of stroke   '[]'$ History of TIA  '[]'$ Aphasia   '[]'$ Temporary blindness   '[]'$ Dysphagia   '[]'$ Weakness or numbness in arms   '[]'$ Weakness or numbness in legs Musculoskeletal:  '[x]'$ Arthritis   '[]'$ Joint swelling   '[]'$ Joint pain   '[]'$ Low back pain Hematologic:  '[]'$ Easy bruising  '[]'$ Easy bleeding   '[]'$ Hypercoagulable state   '[]'$ Anemic   Gastrointestinal:  '[]'$ Blood in stool   '[]'$ Vomiting  blood  '[x]'$ Gastroesophageal reflux/heartburn   '[]'$ Abdominal pain Genitourinary:  '[]'$ Chronic kidney disease   '[]'$ Difficult urination  '[]'$ Frequent urination  '[]'$ Burning with urination   '[]'$ Hematuria Skin:  '[]'$ Rashes   '[]'$ Ulcers   '[]'$ Wounds Psychological:  '[x]'$ History of anxiety   '[x]'$  History of major depression.    Physical Examination  BP 137/76 (BP Location: Right Arm)   Pulse 65   Resp 18   Ht 6' (1.829 m)   Wt 199 lb 3.2 oz (90.4 kg)   BMI 27.02 kg/m  Gen:  WD/WN, NAD Head: [place]/AT, No temporalis wasting. Ear/Nose/Throat: Hearing grossly intact, nares w/o erythema or drainage Eyes: Conjunctiva clear. Sclera non-icteric Neck: Supple.  Trachea midline Pulmonary:  Good air movement, no use of accessory muscles.  Cardiac: RRR, no JVD Vascular:  Vessel Right Left  Radial Palpable Palpable                          PT Palpable Palpable  DP Palpable Palpable   Gastrointestinal: soft, non-tender/non-distended. No guarding/reflex.  Musculoskeletal: M/S 5/5 throughout.  No deformity or atrophy.  No significant lower extremity edema. Neurologic: Sensation grossly intact in extremities.  Symmetrical.  Speech is fluent.  Psychiatric: Judgment intact, Mood & affect appropriate for pt's clinical situation. Dermatologic: No rashes or ulcers noted.  No cellulitis or open wounds.      Labs No results found for this or any previous visit (from the past 2160 hour(s)).  Radiology MR [place] WO CONTRAST  Result Date: 11/19/2021 CLINICAL DATA:  Provided history: Sudden left hearing loss. Sudden asymmetric hearing loss left ear. EXAM: MRI HEAD WITHOUT AND WITH CONTRAST TECHNIQUE: Multiplanar, multiecho pulse sequences of the brain and surrounding structures were obtained without and with intravenous contrast. CONTRAST:  32m MULTIHANCE GADOBENATE DIMEGLUMINE 529 MG/ML IV SOLN COMPARISON:  Brain MRI 12/15/2010. FINDINGS: Brain: No age advanced or lobar predominant parenchymal atrophy. Minimal  patchy and ill-defined T2 FLAIR hyperintense signal abnormality within the bilateral cerebral white matter, nonspecific but compatible chronic small vessel disease. 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 No cerebellopontine angle or internal auditory canal mass is demonstrated. Unremarkable appearance of the 7th and 8th cranial nerves bilaterally. There is no acute infarct. No chronic intracranial blood products. No extra-axial fluid collection. No midline shift. No pathologic intracranial enhancement identified. Vascular: Maintained flow voids within the proximal large arterial vessels. Skull and upper cervical spine: No focal suspicious  marrow lesion. Incompletely assessed cervical spondylosis. Sinuses/Orbits: No mass or acute finding within the imaged orbits. Mild mucosal thickening within the bilateral ethmoid sinuses. IMPRESSION: 1. No evidence of acute intracranial abnormality. 2. No cerebellopontine angle or internal auditory canal mass. 3. Minimal chronic small vessel ischemic changes within the cerebral white matter, slightly progressed from the prior brain MRI of 12/15/2010. 4. 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 5. Mild bilateral ethmoid sinus mucosal thickening. Electronically Signed   By: Kellie Simmering D.O.   On: 11/19/2021 16:58    Assessment/Plan Hypertension blood pressure control important in reducing the progression of atherosclerotic disease and in his case particularly aneurysmal growth. On appropriate oral medications.     Hyperlipidemia lipid control important in reducing the progression of atherosclerotic disease. Continue statin therapy  AAA (abdominal aortic aneurysm) without rupture ([facility]) Duplex today shows this to be under 3 cm in maximal diameter without an endoleak.  Doing well about a decade after repair.  Continue to follow annually.  Popliteal artery aneurysm (HCC) Maximal diameter  was 1.1 cm on the right and 1.0 cm on the left.  No role for intervention.  Stable.  Follow in 1 year.  Femoral artery aneurysm, bilateral (HCC) maximal diameter is 1.5 cm on the right and 1.6 cm on the left which was also stable.  No role for intervention.  Also follow this on an annual basis with duplex.  Blood pressure control important in reducing the aneurysmal growth of all sites.  He stopped smoking many years ago.    Leotis Pain, MD  12/12/2021 12:07 PM    This note was created with Dragon medical transcription system.  Any errors from dictation are purely unintentional

## 2021-12-12 NOTE — Assessment & Plan Note (Signed)
Maximal diameter was 1.1 cm on the right and 1.0 cm on the left.  No role for intervention.  Stable.  Follow in 1 year.

## 2021-12-12 NOTE — Assessment & Plan Note (Signed)
Duplex today shows this to be under 3 cm in maximal diameter without an endoleak.  Doing well about a decade after repair.  Continue to follow annually.

## 2021-12-12 NOTE — Assessment & Plan Note (Signed)
maximal diameter is 1.5 cm on the right and 1.6 cm on the left which was also stable.  No role for intervention.  Also follow this on an annual basis with duplex.  Blood pressure control important in reducing the aneurysmal growth of all sites.  He stopped smoking many years ago.

## 2021-12-22 ENCOUNTER — Encounter (INDEPENDENT_AMBULATORY_CARE_PROVIDER_SITE_OTHER): Payer: Self-pay

## 2021-12-30 DIAGNOSIS — E7800 Pure hypercholesterolemia, unspecified: Secondary | ICD-10-CM | POA: Diagnosis not present

## 2021-12-30 DIAGNOSIS — R21 Rash and other nonspecific skin eruption: Secondary | ICD-10-CM | POA: Diagnosis not present

## 2021-12-30 DIAGNOSIS — I10 Essential (primary) hypertension: Secondary | ICD-10-CM | POA: Diagnosis not present

## 2021-12-30 DIAGNOSIS — Z955 Presence of coronary angioplasty implant and graft: Secondary | ICD-10-CM | POA: Diagnosis not present

## 2022-01-01 DIAGNOSIS — L2089 Other atopic dermatitis: Secondary | ICD-10-CM | POA: Diagnosis not present

## 2022-01-06 DIAGNOSIS — Z03818 Encounter for observation for suspected exposure to other biological agents ruled out: Secondary | ICD-10-CM | POA: Diagnosis not present

## 2022-01-06 DIAGNOSIS — J069 Acute upper respiratory infection, unspecified: Secondary | ICD-10-CM | POA: Diagnosis not present

## 2022-01-06 DIAGNOSIS — U071 COVID-19: Secondary | ICD-10-CM | POA: Diagnosis not present

## 2022-01-28 DIAGNOSIS — E7800 Pure hypercholesterolemia, unspecified: Secondary | ICD-10-CM | POA: Diagnosis not present

## 2022-01-28 DIAGNOSIS — H9192 Unspecified hearing loss, left ear: Secondary | ICD-10-CM | POA: Diagnosis not present

## 2022-01-28 DIAGNOSIS — I10 Essential (primary) hypertension: Secondary | ICD-10-CM | POA: Diagnosis not present

## 2022-01-28 DIAGNOSIS — E039 Hypothyroidism, unspecified: Secondary | ICD-10-CM | POA: Diagnosis not present

## 2022-01-28 DIAGNOSIS — Z79899 Other long term (current) drug therapy: Secondary | ICD-10-CM | POA: Diagnosis not present

## 2022-02-02 DIAGNOSIS — M5416 Radiculopathy, lumbar region: Secondary | ICD-10-CM | POA: Diagnosis not present

## 2022-02-02 DIAGNOSIS — M1612 Unilateral primary osteoarthritis, left hip: Secondary | ICD-10-CM | POA: Diagnosis not present

## 2022-02-04 DIAGNOSIS — Z79899 Other long term (current) drug therapy: Secondary | ICD-10-CM | POA: Diagnosis not present

## 2022-02-04 DIAGNOSIS — F411 Generalized anxiety disorder: Secondary | ICD-10-CM | POA: Diagnosis not present

## 2022-02-04 DIAGNOSIS — I10 Essential (primary) hypertension: Secondary | ICD-10-CM | POA: Diagnosis not present

## 2022-02-04 DIAGNOSIS — E782 Mixed hyperlipidemia: Secondary | ICD-10-CM | POA: Diagnosis not present

## 2022-02-04 DIAGNOSIS — Z125 Encounter for screening for malignant neoplasm of prostate: Secondary | ICD-10-CM | POA: Diagnosis not present

## 2022-02-04 DIAGNOSIS — E039 Hypothyroidism, unspecified: Secondary | ICD-10-CM | POA: Diagnosis not present

## 2022-02-11 ENCOUNTER — Ambulatory Visit
Admission: RE | Admit: 2022-02-11 | Discharge: 2022-02-11 | Disposition: A | Discharge status: Home / Self Care | Payer: Medicare HMO | Source: Ambulatory Visit | Attending: Emergency Medicine | Admitting: Emergency Medicine

## 2022-02-11 VITALS — BP 136/84 | HR 76 | Temp 98.3°F | Resp 18 | Ht 72.0 in | Wt 198.0 lb

## 2022-02-11 DIAGNOSIS — R0981 Nasal congestion: Secondary | ICD-10-CM | POA: Diagnosis not present

## 2022-02-11 DIAGNOSIS — I10 Essential (primary) hypertension: Secondary | ICD-10-CM | POA: Insufficient documentation

## 2022-02-11 DIAGNOSIS — Z1152 Encounter for screening for COVID-19: Secondary | ICD-10-CM | POA: Insufficient documentation

## 2022-02-11 DIAGNOSIS — J0110 Acute frontal sinusitis, unspecified: Secondary | ICD-10-CM | POA: Insufficient documentation

## 2022-02-11 DIAGNOSIS — I714 Abdominal aortic aneurysm, without rupture, unspecified: Secondary | ICD-10-CM | POA: Insufficient documentation

## 2022-02-11 DIAGNOSIS — G4733 Obstructive sleep apnea (adult) (pediatric): Secondary | ICD-10-CM | POA: Diagnosis not present

## 2022-02-11 DIAGNOSIS — J449 Chronic obstructive pulmonary disease, unspecified: Secondary | ICD-10-CM | POA: Diagnosis not present

## 2022-02-11 DIAGNOSIS — E039 Hypothyroidism, unspecified: Secondary | ICD-10-CM | POA: Insufficient documentation

## 2022-02-11 DIAGNOSIS — Z8679 Personal history of other diseases of the circulatory system: Secondary | ICD-10-CM | POA: Diagnosis not present

## 2022-02-11 DIAGNOSIS — J029 Acute pharyngitis, unspecified: Secondary | ICD-10-CM | POA: Insufficient documentation

## 2022-02-11 DIAGNOSIS — Z20828 Contact with and (suspected) exposure to other viral communicable diseases: Secondary | ICD-10-CM | POA: Diagnosis not present

## 2022-02-11 LAB — POCT RAPID STREP A (OFFICE): Rapid Strep A Screen: NEGATIVE

## 2022-02-11 MED ORDER — AMOXICILLIN 875 MG PO TABS: 875.0000 mg | ORAL_TABLET | Freq: Two times a day (BID) | ORAL | 0 refills | Status: AC

## 2022-02-11 NOTE — ED Provider Notes (Signed)
[facility]    CSN: [id number] Arrival date & time: 02/11/22  1344      History   Chief Complaint Chief Complaint  Patient presents with   Sore Throat    also have a sinus headache i had covid  in nov and i have had ever since - Entered by patient    HPI Mitchell Powell is a 71 y.o. male.  Patient presents with 1 month history of sinus congestion, sinus pressure, postnasal drip, runny nose, mild cough.  He has sore throat x 2 days.  His grandson was diagnosed with influenza A on 02/09/2022.  Patient denies fever, shortness of breath, vomiting, diarrhea, or other symptoms.  Treatment at home with salt water gargles.  He reports he was treated for his sinus symptoms last week by his PCP with Zithromax; his symptoms improved some but then returned.  His medical history includes hypertension, AAA, hypothyroidism, COPD, OSA.  The history is provided by the patient and medical records.    Past Medical History:  Diagnosis Date   Anxiety    Depression    GERD (gastroesophageal reflux disease)    Hyperlipidemia    Hypertension    Hypothyroidism     Patient Active Problem List   Diagnosis Date Noted   Bradycardia 11/30/2018   Knee pain 11/22/2018   Acquired hypothyroidism 07/27/2018   Personal history of colonic polyps    Polyp of sigmoid colon    Chest pain with high risk for cardiac etiology 11/12/2017   GERD (gastroesophageal reflux disease) 10/27/2017   Hyperlipidemia 10/27/2017   Hypertension 10/27/2016   AAA (abdominal aortic aneurysm) without rupture ([place]) 10/27/2016   Popliteal artery aneurysm (HCC) 10/27/2016   Femoral artery aneurysm, bilateral (HCC) 10/27/2016   OSA on CPAP 01/21/2016   SOB (shortness of breath) on exertion 10/31/2014   Anxiety disorder 10/25/2013   COPD (chronic obstructive pulmonary disease) ([place]) 10/25/2013   Depression 10/25/2013    Past Surgical History:  Procedure Laterality Date   ABDOMINAL AORTIC ANEURYSM REPAIR      COLONOSCOPY WITH PROPOFOL N/A 05/10/2018   Procedure: COLONOSCOPY WITH PROPOFOL;  Surgeon: [name], MD;  Location: [facility] ENDOSCOPY;  Service: Endoscopy;  Laterality: N/A;   TONSILLECTOMY AND ADENOIDECTOMY         Home Medications    Prior to Admission medications   Medication Sig Start Date End Date Taking? Authorizing Provider  amoxicillin (AMOXIL) 875 MG tablet Take 1 tablet (875 mg total) by mouth 2 (two) times daily for 10 days. 02/11/22 02/21/22 Yes Sharion Balloon, NP  benazepril (LOTENSIN) 20 MG tablet Take by mouth. 03/10/16   [provider]  clonazePAM (KLONOPIN) 0.5 MG tablet  09/15/16   [provider]  FLUoxetine (PROZAC) 20 MG capsule Take by mouth. 03/10/16   [provider]  fluticasone (FLONASE) 50 MCG/ACT nasal spray Place into the nose. 10/13/16   [provider]  levothyroxine (SYNTHROID, LEVOTHROID) 50 MCG tablet Take 50 mcg by mouth daily before breakfast.    [provider]  loratadine (CLARITIN) 10 MG tablet Take by mouth.    [provider]  losartan (COZAAR) 50 MG tablet Take 50 mg by mouth daily. 07/08/21   [provider]  Omega-3 Fatty Acids (FISH OIL) 1000 MG CPDR Take by mouth.    [provider]  omeprazole (PRILOSEC) 20 MG capsule  09/20/16   [provider]  pantoprazole (PROTONIX) 40 MG tablet  09/14/17   [provider]  pravastatin (PRAVACHOL) 40 MG tablet Take by mouth. 03/10/16   [provider]  tadalafil (CIALIS) 20 MG tablet Take 1 tablet (20 mg total) by mouth daily as needed for erectile dysfunction. 09/12/21   Stoioff, Ronda Fairly, MD  tamsulosin (FLOMAX) 0.4 MG CAPS capsule Take by mouth. 08/25/21 08/25/22  [provider]    Family History Family History  Problem Relation Age of Onset   Varicose Veins Father    Heart attack Brother    Diabetes Brother     Social History Social History   Tobacco Use   Smoking status: Never   Smokeless  tobacco: Never  Substance Use Topics   Alcohol use: Yes   Drug use: No     Allergies   Prednisone   Review of Systems Review of Systems  Constitutional:  Negative for chills and fever.  HENT:  Positive for congestion, postnasal drip, rhinorrhea, sinus pressure and sore throat. Negative for ear pain.   Respiratory:  Positive for cough. Negative for shortness of breath.   Cardiovascular:  Negative for chest pain and palpitations.  Gastrointestinal:  Negative for diarrhea and vomiting.  Skin:  Negative for rash.     Physical Exam Triage Vital Signs ED Triage Vitals  Enc Vitals Group     BP 02/11/22 1358 (!) 159/89     Pulse Rate 02/11/22 1358 76     Resp 02/11/22 1358 18     Temp 02/11/22 1358 98.3 F (36.8 C)     Temp src --      SpO2 02/11/22 1358 98 %     Weight 02/11/22 1357 198 lb (89.8 kg)     Height 02/11/22 1357 6' (1.829 m)     Head Circumference --      Peak Flow --      Pain Score 02/11/22 1357 4     Pain Loc --      Pain Edu? --      Excl. in [place]? --    No data found.  Updated Vital Signs BP 136/84   Pulse 76   Temp 98.3 F (36.8 C)   Resp 18   Ht 6' (1.829 m)   Wt 198 lb (89.8 kg)   SpO2 98%   BMI 26.85 kg/m   Visual Acuity Right Eye Distance:   Left Eye Distance:   Bilateral Distance:    Right Eye Near:   Left Eye Near:    Bilateral Near:     Physical Exam Vitals and nursing note reviewed.  Constitutional:      General: He is not in acute distress.    Appearance: He is well-developed. He is not ill-appearing.  HENT:     Right Ear: Tympanic membrane normal.     Left Ear: Tympanic membrane normal.     Nose: Congestion and rhinorrhea present.     Mouth/Throat:     Mouth: Mucous membranes are moist.     Pharynx: Posterior oropharyngeal erythema present.  Cardiovascular:     Rate and Rhythm: Normal rate and regular rhythm.     Heart sounds: Normal heart sounds.  Pulmonary:     Effort: Pulmonary effort is normal. No respiratory  distress.     Breath sounds: Normal breath sounds.  Musculoskeletal:     Cervical back: Neck supple.  Skin:    General: Skin is warm and dry.  Neurological:     Mental Status: He is alert.  Psychiatric:        Mood and  Affect: Mood normal.        Behavior: Behavior normal.      UC Treatments / Results  Labs (all labs ordered are listed, but only abnormal results are displayed) Labs Reviewed  RESP PANEL BY RT-PCR (FLU A&B, COVID) [id number]  POCT RAPID STREP A (OFFICE)    EKG   Radiology No results found.  Procedures Procedures (including critical care time)  Medications Ordered in UC Medications - No data to display  Initial Impression / Assessment and Plan / UC Course  I have reviewed the triage vital signs and the nursing notes.  Pertinent labs & imaging results that were available during my care of the patient were reviewed by me and considered in my medical decision making (see chart for details).    Nasal congestion, acute sinusitis, sore throat.  Patient exposed to influenza A by his grandson.  But his sinus symptoms have been ongoing for 1 month.  Per his request, strep test done and is negative.  COVID and flu pending.  Treating sinusitis with amoxicillin.  Discussed symptomatic treatment including Tylenol or ibuprofen, rest, hydration.  Instructed patient to follow up with PCP if symptoms are not improving.  He agrees to plan of care.   Final Clinical Impressions(s) / UC Diagnoses   Final diagnoses:  Nasal congestion  Acute non-recurrent frontal sinusitis  Sore throat     Discharge Instructions      Your strep test is negative.  COVID and flu are pending.    Take the amoxicillin as directed.  Follow up with your primary care provider if your symptoms are not improving.    Your blood pressure is elevated today at 159/89.  Please have this rechecked by your primary care provider in 2-4 weeks.          ED Prescriptions     Medication Sig Dispense  Auth. Provider   amoxicillin (AMOXIL) 875 MG tablet Take 1 tablet (875 mg total) by mouth 2 (two) times daily for 10 days. 20 tablet Sharion Balloon, NP      PDMP not reviewed this encounter.   Sharion Balloon, NP 02/11/22 1438

## 2022-02-11 NOTE — Discharge Instructions (Addendum)
Your strep test is negative.  COVID and flu are pending.    Take the amoxicillin as directed.  Follow up with your primary care provider if your symptoms are not improving.    Your blood pressure is elevated today at 159/89.  Please have this rechecked by your primary care provider in 2-4 weeks.

## 2022-02-11 NOTE — ED Triage Notes (Signed)
Patient to Urgent Care with complaints of sore throat that started on Monday. Has been gargling salt water. States his grandchild has flu A.  Denies any known fevers.   Also with complaints of sinus headache. Reports this has been lingering since he was diagnosed with covid in mid November. Reports he has taken antibiotics with no improvement.

## 2022-02-12 DIAGNOSIS — M9904 Segmental and somatic dysfunction of sacral region: Secondary | ICD-10-CM | POA: Diagnosis not present

## 2022-02-12 DIAGNOSIS — M9905 Segmental and somatic dysfunction of pelvic region: Secondary | ICD-10-CM | POA: Diagnosis not present

## 2022-02-12 DIAGNOSIS — M9901 Segmental and somatic dysfunction of cervical region: Secondary | ICD-10-CM | POA: Diagnosis not present

## 2022-02-12 DIAGNOSIS — M9903 Segmental and somatic dysfunction of lumbar region: Secondary | ICD-10-CM | POA: Diagnosis not present

## 2022-02-12 LAB — RESP PANEL BY RT-PCR (FLU A&B, COVID) ARPGX2
Influenza A by PCR: NEGATIVE
Influenza B by PCR: NEGATIVE
SARS Coronavirus 2 by RT PCR: NEGATIVE

## 2022-02-13 DIAGNOSIS — M9905 Segmental and somatic dysfunction of pelvic region: Secondary | ICD-10-CM | POA: Diagnosis not present

## 2022-02-13 DIAGNOSIS — M9904 Segmental and somatic dysfunction of sacral region: Secondary | ICD-10-CM | POA: Diagnosis not present

## 2022-02-13 DIAGNOSIS — M9901 Segmental and somatic dysfunction of cervical region: Secondary | ICD-10-CM | POA: Diagnosis not present

## 2022-02-13 DIAGNOSIS — M9903 Segmental and somatic dysfunction of lumbar region: Secondary | ICD-10-CM | POA: Diagnosis not present

## 2022-02-20 DIAGNOSIS — M9901 Segmental and somatic dysfunction of cervical region: Secondary | ICD-10-CM | POA: Diagnosis not present

## 2022-02-20 DIAGNOSIS — M9905 Segmental and somatic dysfunction of pelvic region: Secondary | ICD-10-CM | POA: Diagnosis not present

## 2022-02-20 DIAGNOSIS — M9904 Segmental and somatic dysfunction of sacral region: Secondary | ICD-10-CM | POA: Diagnosis not present

## 2022-02-20 DIAGNOSIS — M9903 Segmental and somatic dysfunction of lumbar region: Secondary | ICD-10-CM | POA: Diagnosis not present

## 2022-02-25 DIAGNOSIS — M9901 Segmental and somatic dysfunction of cervical region: Secondary | ICD-10-CM | POA: Diagnosis not present

## 2022-02-25 DIAGNOSIS — M9904 Segmental and somatic dysfunction of sacral region: Secondary | ICD-10-CM | POA: Diagnosis not present

## 2022-02-25 DIAGNOSIS — M9905 Segmental and somatic dysfunction of pelvic region: Secondary | ICD-10-CM | POA: Diagnosis not present

## 2022-02-25 DIAGNOSIS — M9903 Segmental and somatic dysfunction of lumbar region: Secondary | ICD-10-CM | POA: Diagnosis not present

## 2022-02-27 DIAGNOSIS — M25552 Pain in left hip: Secondary | ICD-10-CM | POA: Diagnosis not present

## 2022-02-27 DIAGNOSIS — M9903 Segmental and somatic dysfunction of lumbar region: Secondary | ICD-10-CM | POA: Diagnosis not present

## 2022-02-27 DIAGNOSIS — M9905 Segmental and somatic dysfunction of pelvic region: Secondary | ICD-10-CM | POA: Diagnosis not present

## 2022-02-27 DIAGNOSIS — M9904 Segmental and somatic dysfunction of sacral region: Secondary | ICD-10-CM | POA: Diagnosis not present

## 2022-02-27 DIAGNOSIS — M9901 Segmental and somatic dysfunction of cervical region: Secondary | ICD-10-CM | POA: Diagnosis not present

## 2022-02-27 DIAGNOSIS — M545 Low back pain, unspecified: Secondary | ICD-10-CM | POA: Diagnosis not present

## 2022-03-02 DIAGNOSIS — M9903 Segmental and somatic dysfunction of lumbar region: Secondary | ICD-10-CM | POA: Diagnosis not present

## 2022-03-02 DIAGNOSIS — M9901 Segmental and somatic dysfunction of cervical region: Secondary | ICD-10-CM | POA: Diagnosis not present

## 2022-03-02 DIAGNOSIS — M9905 Segmental and somatic dysfunction of pelvic region: Secondary | ICD-10-CM | POA: Diagnosis not present

## 2022-03-02 DIAGNOSIS — M9904 Segmental and somatic dysfunction of sacral region: Secondary | ICD-10-CM | POA: Diagnosis not present

## 2022-03-04 DIAGNOSIS — M9905 Segmental and somatic dysfunction of pelvic region: Secondary | ICD-10-CM | POA: Diagnosis not present

## 2022-03-04 DIAGNOSIS — M9901 Segmental and somatic dysfunction of cervical region: Secondary | ICD-10-CM | POA: Diagnosis not present

## 2022-03-04 DIAGNOSIS — M9904 Segmental and somatic dysfunction of sacral region: Secondary | ICD-10-CM | POA: Diagnosis not present

## 2022-03-04 DIAGNOSIS — H524 Presbyopia: Secondary | ICD-10-CM | POA: Diagnosis not present

## 2022-03-04 DIAGNOSIS — M9903 Segmental and somatic dysfunction of lumbar region: Secondary | ICD-10-CM | POA: Diagnosis not present

## 2022-03-09 DIAGNOSIS — M9901 Segmental and somatic dysfunction of cervical region: Secondary | ICD-10-CM | POA: Diagnosis not present

## 2022-03-09 DIAGNOSIS — M9905 Segmental and somatic dysfunction of pelvic region: Secondary | ICD-10-CM | POA: Diagnosis not present

## 2022-03-09 DIAGNOSIS — M9903 Segmental and somatic dysfunction of lumbar region: Secondary | ICD-10-CM | POA: Diagnosis not present

## 2022-03-09 DIAGNOSIS — M9904 Segmental and somatic dysfunction of sacral region: Secondary | ICD-10-CM | POA: Diagnosis not present

## 2022-03-09 NOTE — Progress Notes (Unsigned)
03/10/2022 11:56 AM   Mitchell Powell Oct 30, 1950 [id number]  Referring provider: Idelle Crouch, MD [facility] [place],  [place] [address]  Urological history: 1. BPH with LU TS -PSA (06/2021) 1.51 -RUS (07/2020) - no hydro -cysto (08/2020) - Prominent lateral lobe enlargement prostate -Moderate elevation bladder neck - Retroflexion shows intravesical median lobe and lateral lobe intravesical extension  -prostate volume ~ 91 cc -I PSS 24/5 -PVR 203 mL -tamsulosin 0.4 mg daily   2. ED -contributing factors of age, BPH, HTN, COPD, sleep apnea, hypothyroidism, HLD, anxiety, depression and alcohol consumption -SHIM 14 -tadalafil 20 mg, on-demand-dosing   Chief Complaint  Patient presents with   Benign Prostatic Hypertrophy    HPI: Mitchell Powell is a 72 y.o. male who presents today for one year follow up.    I PSS 24/5  He is having issues with frequency, urgency, terminal dysuria after straining to urinate and a weak urinary stream.  He has been taking tamsulosin, but it has not been effective in reducing his symptoms.  His PCP placed him on finasteride, but after 30 days he discontinued the medication.  Patient denies any modifying or aggravating factors.  Patient denies any gross hematuria, dysuria or suprapubic/flank pain.  Patient denies any fevers, chills, nausea or vomiting.     IPSS     Row Name 03/10/22 1100         International Prostate Symptom Score   How often have you had the sensation of not emptying your bladder? Less than half the time     How often have you had to urinate less than every two hours? Almost always     How often have you found you stopped and started again several times when you urinated? About half the time     How often have you found it difficult to postpone urination? More than half the time     How often have you had a weak urinary stream? More than half the time     How often have you had to strain  to start urination? More than half the time     How many times did you typically get up at night to urinate? 3 Times     Total IPSS Score 25       Quality of Life due to urinary symptoms   If you were to spend the rest of your life with your urinary condition just the way it is now how would you feel about that? Unhappy              Score:  1-7 Mild 8-19 Moderate 20-35 Severe    SHIM 14  Patient is not having spontaneous erections.  He denies any pain or curvature with erections.  He is taking 1/2 tablet, of a tadalafil 20 mg.    SHIM     Row Name 03/10/22 1121         SHIM: Over the last 6 months:   How do you rate your confidence that you could get and keep an erection? Low     When you had erections with sexual stimulation, how often were your erections hard enough for penetration (entering your partner)? Sometimes (about half the time)     During sexual intercourse, how often were you able to maintain your erection after you had penetrated (entered) your partner? Most Times (much more than half the time)     During sexual intercourse, how  difficult was it to maintain your erection to completion of intercourse? Slightly Difficult     When you attempted sexual intercourse, how often was it satisfactory for you? Almost Never or Never       SHIM Total Score   SHIM 14              Score: 1-7 Severe ED 8-11 Moderate ED 12-16 Mild-Moderate ED 17-21 Mild ED 22-25 No ED    PMH: Past Medical History:  Diagnosis Date   Anxiety    Depression    GERD (gastroesophageal reflux disease)    Hyperlipidemia    Hypertension    Hypothyroidism     Surgical History: Past Surgical History:  Procedure Laterality Date   ABDOMINAL AORTIC ANEURYSM REPAIR     COLONOSCOPY WITH PROPOFOL N/A 05/10/2018   Procedure: COLONOSCOPY WITH PROPOFOL;  Surgeon: [name], MD;  Location: [facility] ENDOSCOPY;  Service: Endoscopy;  Laterality: N/A;   TONSILLECTOMY AND ADENOIDECTOMY       Home Medications:  Allergies as of 03/10/2022       Reactions   Prednisone    Other reaction(s): Other (See Comments) irritiability        Medication List        Accurate as of March 10, 2022 11:56 AM. If you have any questions, ask your nurse or doctor.          benazepril 20 MG tablet Commonly known as: LOTENSIN Take by mouth.   clonazePAM 0.5 MG tablet Commonly known as: KLONOPIN   finasteride 5 MG tablet Commonly known as: PROSCAR Take 1 tablet (5 mg total) by mouth daily. Started by: Zara Council, PA-C   Fish Oil 1000 MG Cpdr Take by mouth.   FLUoxetine 20 MG capsule Commonly known as: PROZAC Take by mouth.   fluticasone 50 MCG/ACT nasal spray Commonly known as: FLONASE Place into the nose.   levothyroxine 50 MCG tablet Commonly known as: SYNTHROID Take 50 mcg by mouth daily before breakfast.   loratadine 10 MG tablet Commonly known as: CLARITIN Take by mouth.   losartan 50 MG tablet Commonly known as: COZAAR Take 50 mg by mouth daily.   omeprazole 20 MG capsule Commonly known as: PRILOSEC   pantoprazole 40 MG tablet Commonly known as: PROTONIX   pravastatin 40 MG tablet Commonly known as: PRAVACHOL Take by mouth.   tadalafil 20 MG tablet Commonly known as: CIALIS Take 1 tablet (20 mg total) by mouth daily as needed for erectile dysfunction. What changed: Another medication with the same name was added. Make sure you understand how and when to take each. Changed by: Zara Council, PA-C   tadalafil 5 MG tablet Commonly known as: CIALIS Take 1 tablet (5 mg total) by mouth daily as needed for erectile dysfunction. What changed: You were already taking a medication with the same name, and this prescription was added. Make sure you understand how and when to take each. Changed by: Zara Council, PA-C   tamsulosin 0.4 MG Caps capsule Commonly known as: FLOMAX Take by mouth.        Allergies:  Allergies  Allergen  Reactions   Prednisone     Other reaction(s): Other (See Comments) irritiability    Family History: Family History  Problem Relation Age of Onset   Varicose Veins Father    Heart attack Brother    Diabetes Brother     Social History:  reports that he has never smoked. He has never used smokeless tobacco. He reports current  alcohol use. He reports that he does not use drugs.  ROS: Pertinent ROS in HPI  Physical Exam: BP (!) 150/81   Pulse 85   Ht 6' (1.829 m)   Wt 195 lb (88.5 kg)   BMI 26.45 kg/m   Constitutional:  Well nourished. Alert and oriented, No acute distress. HEENT: [place] AT, moist mucus membranes.  Trachea midline Cardiovascular: No clubbing, cyanosis, or edema. Respiratory: Normal respiratory effort, no increased work of breathing. Neurologic: Grossly intact, no focal deficits, moving all 4 extremities. Psychiatric: Normal mood and affect.  Laboratory Data: Cholesterol, Total 100 - 200 mg/dL 161  Triglyceride 35 - 199 mg/dL 103  HDL (High Density Lipoprotein) Cholesterol 29.0 - 71.0 mg/dL 62.9  LDL Calculated 0 - 130 mg/dL 78  VLDL Cholesterol mg/dL 21  Cholesterol/HDL Ratio  2.6  Resulting Agency  [facility] - [facility]   Specimen Collected: 01/28/22 09:05   Performed by: [place]: 01/28/22 15:35  Received From: [facility]  Result Received: 02/11/22 13:44   WBC (White Blood Cell Count) 4.1 - 10.2 10^3/uL 5.9  RBC (Red Blood Cell Count) 4.69 - 6.13 10^6/uL 4.45 Low   Hemoglobin 14.1 - 18.1 gm/dL 14.4  Hematocrit 40.0 - 52.0 % 44.7  MCV (Mean Corpuscular Volume) 80.0 - 100.0 fl 100.4 High   MCH (Mean Corpuscular Hemoglobin) 27.0 - 31.2 pg 32.4 High   MCHC (Mean Corpuscular Hemoglobin Concentration) 32.0 - 36.0 gm/dL 32.2  Platelet Count 150 - 450 10^3/uL 214  RDW-CV (Red Cell Distribution Width) 11.6 - 14.8 % 12.8  MPV (Mean Platelet Volume) 9.4 - 12.4 fl 9.4  Neutrophils 1.50 - 7.80 10^3/uL 3.82   Lymphocytes 1.00 - 3.60 10^3/uL 1.42  Monocytes 0.00 - 1.50 10^3/uL 0.49  Eosinophils 0.00 - 0.55 10^3/uL 0.18  Basophils 0.00 - 0.09 10^3/uL 0.02  Neutrophil % 32.0 - 70.0 % 64.4  Lymphocyte % 10.0 - 50.0 % 23.9  Monocyte % 4.0 - 13.0 % 8.2  Eosinophil % 1.0 - 5.0 % 3.0  Basophil% 0.0 - 2.0 % 0.3  Immature Granulocyte % <=0.7 % 0.2  Immature Granulocyte Count <=0.06 10^3/L 0.01  Resulting Agency  [facility] - [facility]   Specimen Collected: 01/28/22 09:05   Performed by: [facility] - [facility] Last Resulted: 01/28/22 11:03  Received From: [facility]  Result Received: 02/11/22 13:44   Glucose 70 - 110 mg/dL 94  Sodium 136 - 145 mmol/L 143  Potassium 3.6 - 5.1 mmol/L 4.4  Chloride 97 - 109 mmol/L 104  Carbon Dioxide (CO2) 22.0 - 32.0 mmol/L 29.0  Urea Nitrogen (BUN) 7 - 25 mg/dL 14  Creatinine 0.7 - 1.3 mg/dL 1.0  Glomerular Filtration Rate (eGFR) >60 mL/min/1.73sq m 80  Comment: CKD-EPI (2021) does not include patient's race in the calculation of eGFR.  Monitoring changes of plasma creatinine and eGFR over time is useful for monitoring kidney function.  Interpretive Ranges for eGFR (CKD-EPI 2021):  eGFR:       >60 mL/min/1.73 sq. m - Normal eGFR:       30-59 mL/min/1.73 sq. m - Moderately Decreased eGFR:       15-29 mL/min/1.73 sq. m  - Severely Decreased eGFR:       < 15 mL/min/1.73 sq. m  - Kidney Failure   Note: These eGFR calculations do not apply in acute situations when eGFR is changing rapidly or patients on dialysis.  Calcium 8.7 - 10.3 mg/dL 9.7  AST 8 - 39 U/L 16  ALT 6 - 57 U/L 18  Alk Phos (alkaline Phosphatase) 34 - 104 U/L 68  Albumin 3.5 - 4.8 g/dL 4.4  Bilirubin, Total 0.3 - 1.2 mg/dL 0.6  Protein, Total 6.1 - 7.9 g/dL 6.7  A/G Ratio 1.0 - 5.0 gm/dL 1.9  Resulting Agency  [facility] - [facility]   Specimen Collected: 01/28/22 09:05   Performed by: [place]: 01/28/22 15:35  Received From:  [facility]  Result Received: 02/11/22 13:44   Color Colorless, Straw, Light Yellow, Yellow, Dark Yellow Light Yellow  Clarity Clear Clear  Specific Gravity 1.005 - 1.030 1.013  pH, Urine 5.0 - 8.0 5.0  Protein, Urinalysis Negative mg/dL Negative  Glucose, Urinalysis Negative mg/dL Negative  Ketones, Urinalysis Negative mg/dL Negative  Blood, Urinalysis Negative Negative  Nitrite, Urinalysis Negative Negative  Leukocyte Esterase, Urinalysis Negative Negative  Bilirubin, Urinalysis Negative Negative  Urobilinogen, Urinalysis 0.2 - 1.0 mg/dL 0.2  Mucous, Urine None Seen PRESENT Abnormal   WBC, UA <=5 /hpf <1  Red Blood Cells, Urinalysis <=3 /hpf 0  Bacteria, Urinalysis 0 - 5 /hpf 0-5  Squamous Epithelial Cells, Urinalysis /hpf 1  Resulting Agency  [facility] - [facility]   Specimen Collected: 01/28/22 09:05   Performed by: [facility] - [facility] Last Resulted: 01/28/22 11:38  Received From: [facility]  Result Received: 02/11/22 13:44   Thyroid Stimulating Hormone (TSH) 0.450-5.330 uIU/ml uIU/mL 4.906  Resulting Agency  [facility] - [facility]   Specimen Collected: 01/28/22 09:05   Performed by: [facility] - [facility] Last Resulted: 01/28/22 14:13  Received From: [facility]  Result Received: 02/11/22 13:44  I have reviewed the labs.   Pertinent Imaging:  03/10/22 11:27  Scan Result 203    Assessment & Plan:    1. BPH with LUTS -PSA stable  -UA benign  -PVR < 300 cc  -most bothersome symptoms are frequency, straining to urinate and terminal dysuria -continue conservative management, avoiding bladder irritants and timed voiding's -dicussed undergoing a bladder outlet procedure, but he deferred at this time -discontinue tamsulosin 0.4 mg daily -start tadalafil 5 mg daily -restart finasteride 5 mg daily - explained that it takes at least three months for the medication takes effect   2. ED -continue  tadalafil 20 mg, 1/2 tablet, on-demand-dosing     Return in about 3 months (around 06/09/2022) for IPSS, SHIM and PVR .  These notes generated with voice recognition software. I apologize for typographical errors.  Mark, [place] [address]  [place], [place] [address] [phone]

## 2022-03-10 ENCOUNTER — Ambulatory Visit: Payer: Medicare HMO | Admitting: Urology

## 2022-03-10 ENCOUNTER — Encounter: Payer: Self-pay | Admitting: Urology

## 2022-03-10 VITALS — BP 150/81 | HR 85 | Ht 72.0 in | Wt 195.0 lb

## 2022-03-10 DIAGNOSIS — N401 Enlarged prostate with lower urinary tract symptoms: Secondary | ICD-10-CM | POA: Diagnosis not present

## 2022-03-10 DIAGNOSIS — N5201 Erectile dysfunction due to arterial insufficiency: Secondary | ICD-10-CM | POA: Diagnosis not present

## 2022-03-10 LAB — BLADDER SCAN AMB NON-IMAGING: Scan Result: 203

## 2022-03-10 MED ORDER — TADALAFIL 5 MG PO TABS: 5.0000 mg | ORAL_TABLET | Freq: Every day | ORAL | 3 refills | Status: DC | PRN

## 2022-03-10 MED ORDER — FINASTERIDE 5 MG PO TABS: 5.0000 mg | ORAL_TABLET | Freq: Every day | ORAL | 1 refills | Status: DC

## 2022-03-10 NOTE — Patient Instructions (Addendum)
Take the Cialis 5 mg (tadalafil) every day - the same time every   Take the Cialis 20 mg (tadalafil) when you what to have intercourse  Take the Proscar 5 mg (finasteride) every day   Do not take the tamsulosin 0.4 mg (Flomax) the 5 mg Cialis replaces this

## 2022-03-11 DIAGNOSIS — M9904 Segmental and somatic dysfunction of sacral region: Secondary | ICD-10-CM | POA: Diagnosis not present

## 2022-03-11 DIAGNOSIS — M9901 Segmental and somatic dysfunction of cervical region: Secondary | ICD-10-CM | POA: Diagnosis not present

## 2022-03-11 DIAGNOSIS — M9903 Segmental and somatic dysfunction of lumbar region: Secondary | ICD-10-CM | POA: Diagnosis not present

## 2022-03-11 DIAGNOSIS — M9905 Segmental and somatic dysfunction of pelvic region: Secondary | ICD-10-CM | POA: Diagnosis not present

## 2022-03-13 ENCOUNTER — Other Ambulatory Visit: Payer: Self-pay | Admitting: Family Medicine

## 2022-03-13 DIAGNOSIS — N5201 Erectile dysfunction due to arterial insufficiency: Secondary | ICD-10-CM

## 2022-03-13 MED ORDER — TADALAFIL 5 MG PO TABS: 5.0000 mg | ORAL_TABLET | Freq: Every day | ORAL | 3 refills | Status: DC | PRN

## 2022-03-16 DIAGNOSIS — M545 Low back pain, unspecified: Secondary | ICD-10-CM | POA: Diagnosis not present

## 2022-03-16 DIAGNOSIS — M9903 Segmental and somatic dysfunction of lumbar region: Secondary | ICD-10-CM | POA: Diagnosis not present

## 2022-03-16 DIAGNOSIS — M9904 Segmental and somatic dysfunction of sacral region: Secondary | ICD-10-CM | POA: Diagnosis not present

## 2022-03-16 DIAGNOSIS — M9901 Segmental and somatic dysfunction of cervical region: Secondary | ICD-10-CM | POA: Diagnosis not present

## 2022-03-16 DIAGNOSIS — M9905 Segmental and somatic dysfunction of pelvic region: Secondary | ICD-10-CM | POA: Diagnosis not present

## 2022-03-19 DIAGNOSIS — M9903 Segmental and somatic dysfunction of lumbar region: Secondary | ICD-10-CM | POA: Diagnosis not present

## 2022-03-19 DIAGNOSIS — M9901 Segmental and somatic dysfunction of cervical region: Secondary | ICD-10-CM | POA: Diagnosis not present

## 2022-03-19 DIAGNOSIS — M9904 Segmental and somatic dysfunction of sacral region: Secondary | ICD-10-CM | POA: Diagnosis not present

## 2022-03-19 DIAGNOSIS — M9905 Segmental and somatic dysfunction of pelvic region: Secondary | ICD-10-CM | POA: Diagnosis not present

## 2022-03-20 DIAGNOSIS — M545 Low back pain, unspecified: Secondary | ICD-10-CM | POA: Diagnosis not present

## 2022-03-20 DIAGNOSIS — M25552 Pain in left hip: Secondary | ICD-10-CM | POA: Diagnosis not present

## 2022-03-20 NOTE — Telephone Encounter (Signed)
Patient notified he may need to have a bladder outlet procedure. Appointment to discuss has been made.

## 2022-03-23 DIAGNOSIS — M9904 Segmental and somatic dysfunction of sacral region: Secondary | ICD-10-CM | POA: Diagnosis not present

## 2022-03-23 DIAGNOSIS — M9903 Segmental and somatic dysfunction of lumbar region: Secondary | ICD-10-CM | POA: Diagnosis not present

## 2022-03-23 DIAGNOSIS — M9901 Segmental and somatic dysfunction of cervical region: Secondary | ICD-10-CM | POA: Diagnosis not present

## 2022-03-23 DIAGNOSIS — M9905 Segmental and somatic dysfunction of pelvic region: Secondary | ICD-10-CM | POA: Diagnosis not present

## 2022-03-25 DIAGNOSIS — M9905 Segmental and somatic dysfunction of pelvic region: Secondary | ICD-10-CM | POA: Diagnosis not present

## 2022-03-25 DIAGNOSIS — M9904 Segmental and somatic dysfunction of sacral region: Secondary | ICD-10-CM | POA: Diagnosis not present

## 2022-03-25 DIAGNOSIS — M9901 Segmental and somatic dysfunction of cervical region: Secondary | ICD-10-CM | POA: Diagnosis not present

## 2022-03-25 DIAGNOSIS — M9903 Segmental and somatic dysfunction of lumbar region: Secondary | ICD-10-CM | POA: Diagnosis not present

## 2022-03-30 DIAGNOSIS — M25552 Pain in left hip: Secondary | ICD-10-CM | POA: Diagnosis not present

## 2022-03-30 DIAGNOSIS — M545 Low back pain, unspecified: Secondary | ICD-10-CM | POA: Diagnosis not present

## 2022-03-31 NOTE — Progress Notes (Unsigned)
04/01/2022 9:36 PM   Mitchell Powell 09/29/1950 [id number]  Referring provider: Idelle Crouch, MD [place] [facility] Mitchell Powell,  Mitchell Powell [id number]  Urological history: 1. BPH with LU TS -PSA (06/2021) 1.51 -RUS (07/2020) - no hydro -cysto (08/2020) - Prominent lateral lobe enlargement prostate -Moderate elevation bladder neck - Retroflexion shows intravesical median lobe and lateral lobe intravesical extension  -prostate volume ~ 91 cc -I PSS *** -PVR *** mL  2. ED -contributing factors of age, BPH, HTN, COPD, sleep apnea, hypothyroidism, HLD, anxiety, depression and alcohol consumption -SHIM 14 -tadalafil 20 mg, on-demand-dosing   No chief complaint on file.   HPI: Mitchell Powell is a 72 y.o. male who presents today for after experiencing a weak stream and burning with urination after starting the tadalafil 5 mg daily and finasteride 5 mg daily.    I PSS ***      Score:  1-7 Mild 8-19 Moderate 20-35 Severe      PMH: Past Medical History:  Diagnosis Date   Anxiety    Depression    GERD (gastroesophageal reflux disease)    Hyperlipidemia    Hypertension    Hypothyroidism     Surgical History: Past Surgical History:  Procedure Laterality Date   ABDOMINAL AORTIC ANEURYSM REPAIR     COLONOSCOPY WITH PROPOFOL N/A 05/10/2018   Procedure: COLONOSCOPY WITH PROPOFOL;  Surgeon: [name], MD;  Location: [facility] ENDOSCOPY;  Service: Endoscopy;  Laterality: N/A;   TONSILLECTOMY AND ADENOIDECTOMY      Home Medications:  Allergies as of 04/01/2022       Reactions   Prednisone    Other reaction(s): Other (See Comments) irritiability        Medication List        Accurate as of March 31, 2022  9:36 PM. If you have any questions, ask your nurse or doctor.          benazepril 20 MG tablet Commonly known as: LOTENSIN Take by mouth.   clonazePAM 0.5 MG tablet Commonly known as: KLONOPIN   finasteride 5 MG tablet Commonly  known as: PROSCAR Take 1 tablet (5 mg total) by mouth daily.   Fish Oil 1000 MG Cpdr Take by mouth.   FLUoxetine 20 MG capsule Commonly known as: PROZAC Take by mouth.   fluticasone 50 MCG/ACT nasal spray Commonly known as: FLONASE Place into the nose.   levothyroxine 50 MCG tablet Commonly known as: SYNTHROID Take 50 mcg by mouth daily before breakfast.   loratadine 10 MG tablet Commonly known as: CLARITIN Take by mouth.   losartan 50 MG tablet Commonly known as: COZAAR Take 50 mg by mouth daily.   omeprazole 20 MG capsule Commonly known as: PRILOSEC   pantoprazole 40 MG tablet Commonly known as: PROTONIX   pravastatin 40 MG tablet Commonly known as: PRAVACHOL Take by mouth.   tadalafil 20 MG tablet Commonly known as: CIALIS Take 1 tablet (20 mg total) by mouth daily as needed for erectile dysfunction.   tadalafil 5 MG tablet Commonly known as: CIALIS Take 1 tablet (5 mg total) by mouth daily as needed for erectile dysfunction.   tamsulosin 0.4 MG Caps capsule Commonly known as: FLOMAX Take by mouth.        Allergies:  Allergies  Allergen Reactions   Prednisone     Other reaction(s): Other (See Comments) irritiability    Family History: Family History  Problem Relation Age of Onset   Varicose Veins Father  Heart attack Brother    Diabetes Brother     Social History:  reports that he has never smoked. He has never used smokeless tobacco. He reports current alcohol use. He reports that he does not use drugs.  ROS: Pertinent ROS in HPI  Physical Exam: There were no vitals taken for this visit.  Constitutional:  Well nourished. Alert and oriented, No acute distress. HEENT: [place] AT, moist mucus membranes.  Trachea midline Cardiovascular: No clubbing, cyanosis, or edema. Respiratory: Normal respiratory effort, no increased work of breathing. GU: No CVA tenderness.  No bladder fullness or masses.  Patient with circumcised/uncircumcised phallus.  ***Foreskin easily retracted***  Urethral meatus is patent.  No penile discharge. No penile lesions or rashes. Scrotum without lesions, cysts, rashes and/or edema.  Testicles are located scrotally bilaterally. No masses are appreciated in the testicles. Left and right epididymis are normal. Rectal: Patient with  normal sphincter tone. Anus and perineum without scarring or rashes. No rectal masses are appreciated. Prostate is approximately *** grams, *** nodules are appreciated. Seminal vesicles are normal. Neurologic: Grossly intact, no focal deficits, moving all 4 extremities. Psychiatric: Normal mood and affect.   Laboratory Data: Urinalysis *** I have reviewed the labs.   Pertinent Imaging: ***  Assessment & Plan:    1. BPH with LUTS -PSA stable  -UA benign  -PVR < 300 cc  -most bothersome symptoms are frequency, straining to urinate and terminal dysuria -continue conservative management, avoiding bladder irritants and timed voiding's -dicussed undergoing a bladder outlet procedure, but he deferred at this time -discontinue tamsulosin 0.4 mg daily -start tadalafil 5 mg daily -restart finasteride 5 mg daily - explained that it takes at least three months for the medication takes effect   2. ED -continue tadalafil 20 mg, 1/2 tablet, on-demand-dosing     No follow-ups on file.  These notes generated with voice recognition software. I apologize for typographical errors.  [place], [place] [address]  [place], [place] [address] [phone]

## 2022-04-01 ENCOUNTER — Ambulatory Visit: Payer: Medicare HMO | Admitting: Urology

## 2022-04-01 ENCOUNTER — Encounter: Payer: Self-pay | Admitting: Urology

## 2022-04-01 VITALS — BP 144/83 | HR 57 | Ht 72.0 in | Wt 195.0 lb

## 2022-04-01 DIAGNOSIS — N401 Enlarged prostate with lower urinary tract symptoms: Secondary | ICD-10-CM | POA: Diagnosis not present

## 2022-04-01 DIAGNOSIS — M9905 Segmental and somatic dysfunction of pelvic region: Secondary | ICD-10-CM | POA: Diagnosis not present

## 2022-04-01 DIAGNOSIS — M9903 Segmental and somatic dysfunction of lumbar region: Secondary | ICD-10-CM | POA: Diagnosis not present

## 2022-04-01 DIAGNOSIS — N5201 Erectile dysfunction due to arterial insufficiency: Secondary | ICD-10-CM

## 2022-04-01 DIAGNOSIS — M9901 Segmental and somatic dysfunction of cervical region: Secondary | ICD-10-CM | POA: Diagnosis not present

## 2022-04-01 DIAGNOSIS — M9904 Segmental and somatic dysfunction of sacral region: Secondary | ICD-10-CM | POA: Diagnosis not present

## 2022-04-01 LAB — URINALYSIS, COMPLETE
Bilirubin, UA: NEGATIVE
Glucose, UA: NEGATIVE
Ketones, UA: NEGATIVE
Leukocytes,UA: NEGATIVE
Nitrite, UA: NEGATIVE
Protein,UA: NEGATIVE
RBC, UA: NEGATIVE
Specific Gravity, UA: 1.01 (ref 1.005–1.030)
Urobilinogen, Ur: 0.2 mg/dL (ref 0.2–1.0)
pH, UA: 6.5 (ref 5.0–7.5)

## 2022-04-01 LAB — MICROSCOPIC EXAMINATION

## 2022-04-01 LAB — BLADDER SCAN AMB NON-IMAGING

## 2022-04-01 NOTE — Patient Instructions (Signed)

## 2022-04-06 DIAGNOSIS — M545 Low back pain, unspecified: Secondary | ICD-10-CM | POA: Diagnosis not present

## 2022-04-06 DIAGNOSIS — M25552 Pain in left hip: Secondary | ICD-10-CM | POA: Diagnosis not present

## 2022-04-08 DIAGNOSIS — M9901 Segmental and somatic dysfunction of cervical region: Secondary | ICD-10-CM | POA: Diagnosis not present

## 2022-04-08 DIAGNOSIS — M9905 Segmental and somatic dysfunction of pelvic region: Secondary | ICD-10-CM | POA: Diagnosis not present

## 2022-04-08 DIAGNOSIS — M9903 Segmental and somatic dysfunction of lumbar region: Secondary | ICD-10-CM | POA: Diagnosis not present

## 2022-04-08 DIAGNOSIS — M9904 Segmental and somatic dysfunction of sacral region: Secondary | ICD-10-CM | POA: Diagnosis not present

## 2022-04-12 IMAGING — MR MR LUMBAR SPINE W/O CM
5 series · 31 of 48 positions shown · non-contrast
Comparison: None.

CLINICAL DATA: Low back pain

EXAM:
MRI LUMBAR SPINE WITHOUT CONTRAST
TECHNIQUE: Multiplanar, multisequence MR imaging of the lumbar spine was
performed. No intravenous contrast was administered.

[Series 5: T2 · sagittal · 4.0mm · 0.81mm/px · 7 of 18 slices shown (1 of 2)]
[im 1/18]
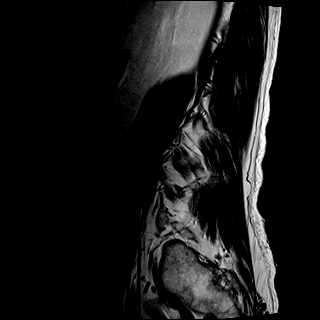
[im 3/18]
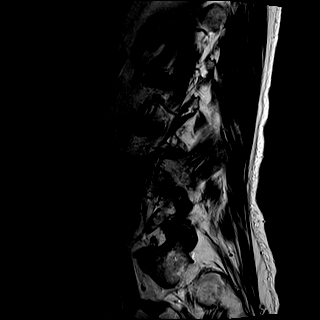
[im 6/18]
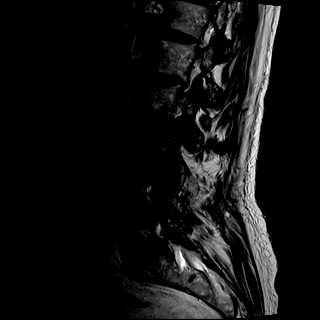
[im 9/18]
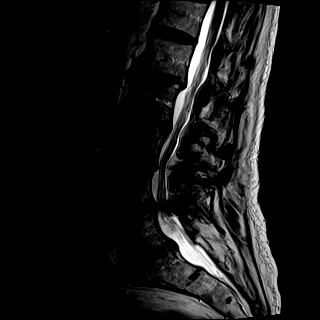
[im 12/18]
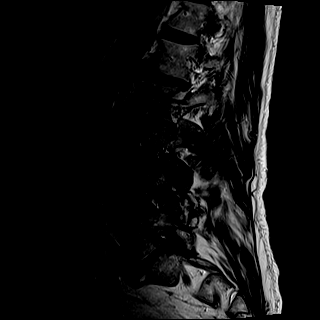
[im 15/18]
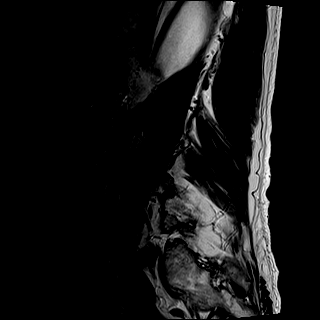
[im 18/18]
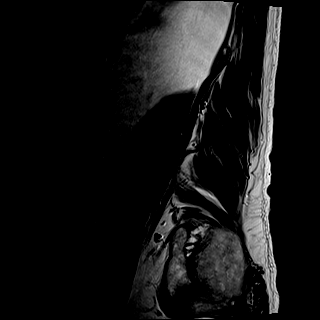

[Series 6: T1 · sagittal · 4.0mm · 0.81mm/px · 7 of 18 slices shown (1 of 2)]
[im 1/18]
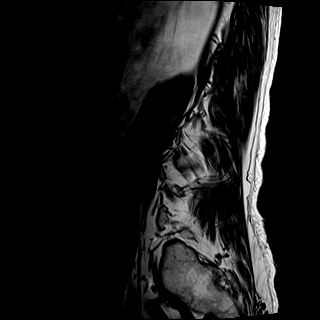
[im 3/18]
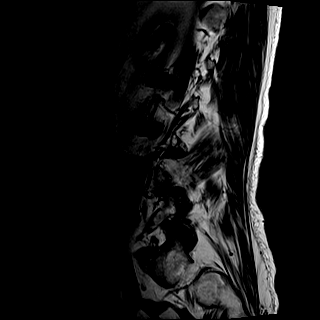
[im 6/18]
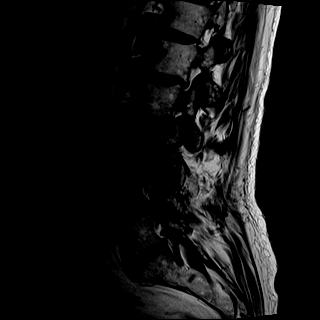
[im 9/18]
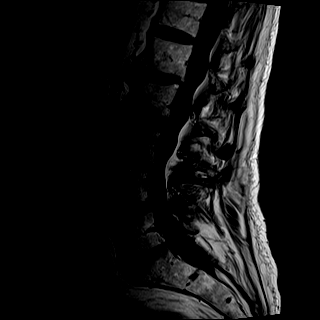
[im 12/18]
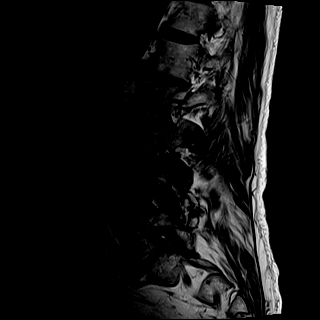
[im 15/18]
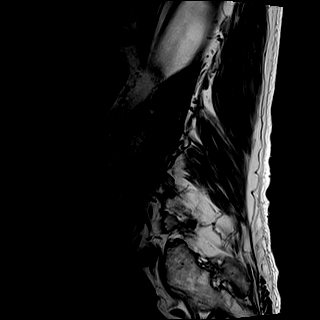
[im 18/18]
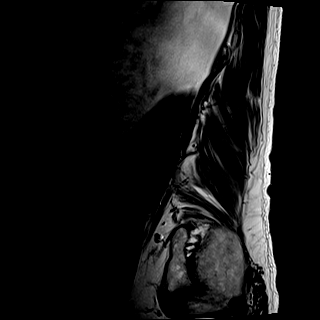

[Series 7: STIR · sagittal · 4.0mm · 0.41mm/px · 1 of 18 slices shown]
[im 1/18]
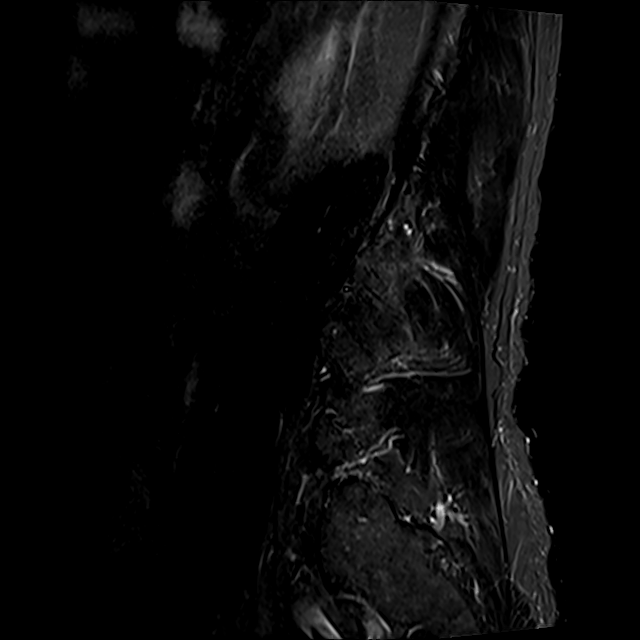

[Series 8: T2 · axial · 4.0mm · 0.78mm/px · z∈[-52,+180]mm · 8 of 40 slices shown (2 of 2)]
[im 1/40]
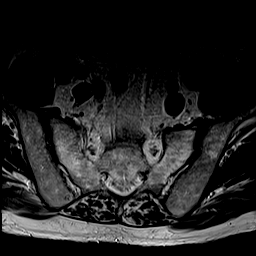
[im 7/40]
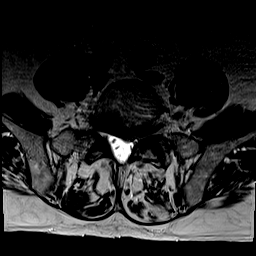
[im 13/40]
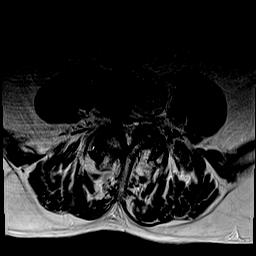
[im 19/40]
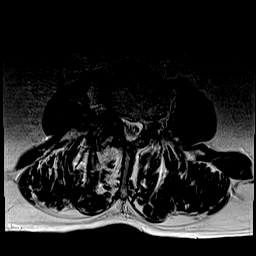
[im 22/40]
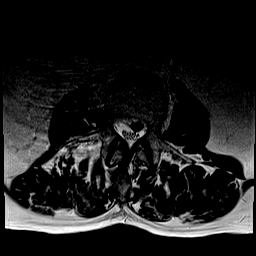
[im 28/40]
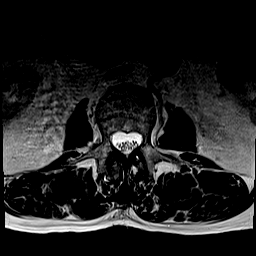
[im 34/40]
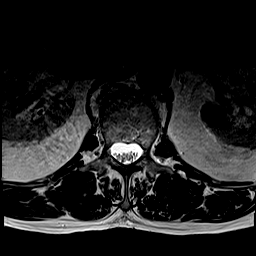
[im 40/40]
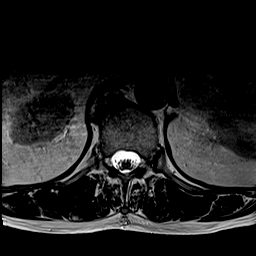

[Series 9: T1 · axial · 4.0mm · 0.39mm/px · z∈[-52,+180]mm · 8 of 40 slices shown (2 of 2)]
[im 1/40]
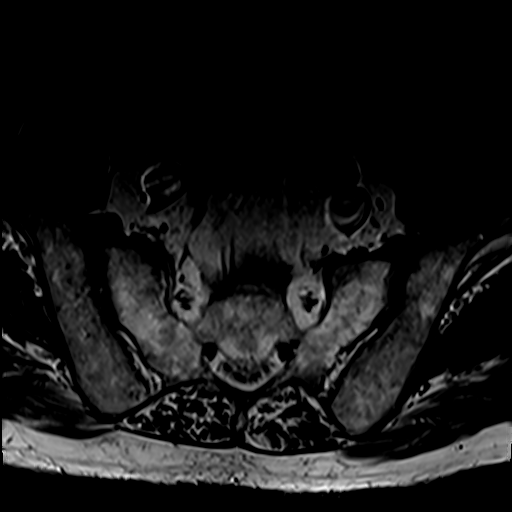
[im 7/40]
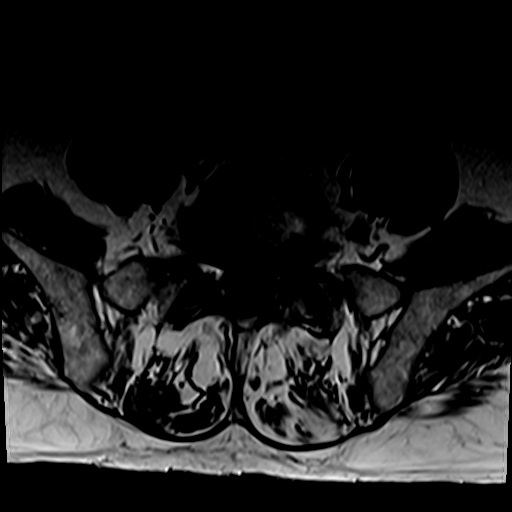
[im 13/40]
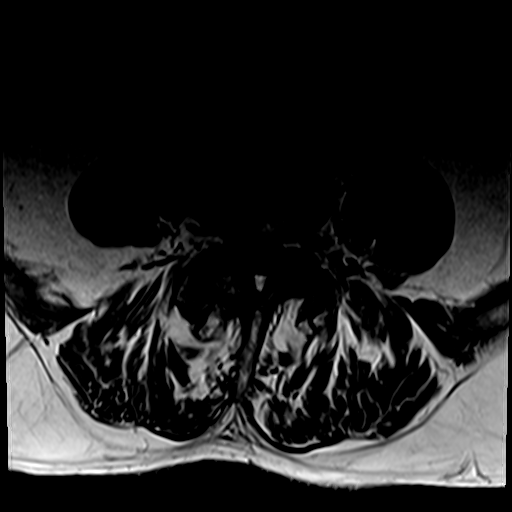
[im 19/40]
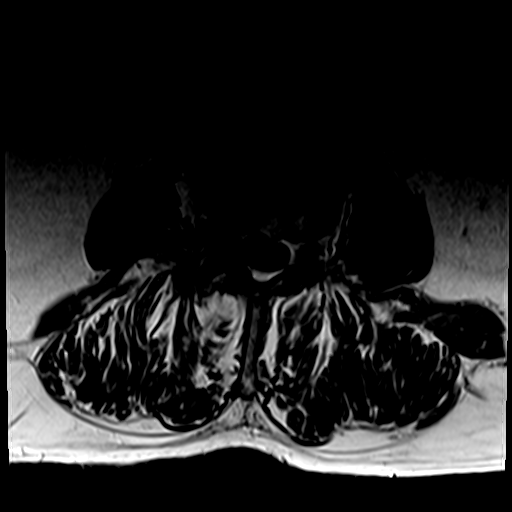
[im 22/40]
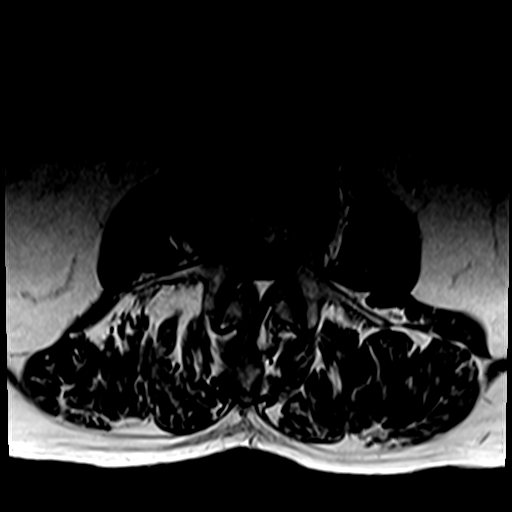
[im 28/40]
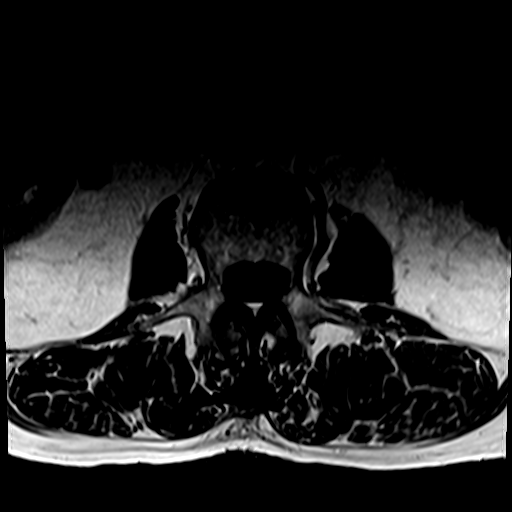
[im 34/40]
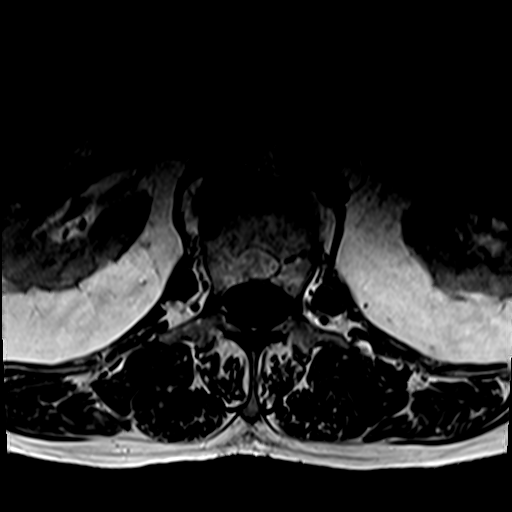
[im 40/40]
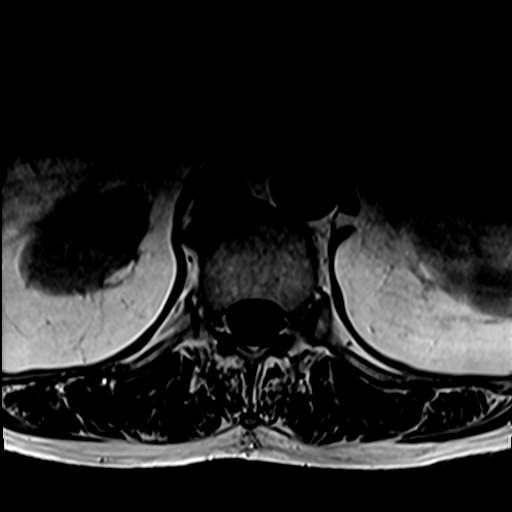

[31 of 48 positions shown; findings below may reference images not displayed]

FINDINGS: Segmentation:  Normal

Alignment:  Mild retrolisthesis L1-2, L2-3, L3-4

Vertebrae:  Normal bone marrow.  Negative for fracture or mass.

Conus medullaris and cauda equina: Conus extends to the L1 level.
Conus and cauda equina appear normal.

Paraspinal and other soft tissues: Negative for paraspinous mass or
adenopathy.

Disc levels:

L1-2: Disc degeneration with disc bulging and endplate spurring,
left greater than right. Mild facet degeneration. Subarticular
stenosis on the left due to disc and osteophyte complex.

L2-3: Moderately large extruded disc fragment with downgoing disc
material to the left of midline. In addition, there is diffuse
endplate spurring and bilateral facet degeneration. Moderate spinal
stenosis. Moderate subarticular stenosis bilaterally right greater
than left.

L3-4: Disc degeneration with diffuse endplate spurring. Moderate to
advanced facet degeneration bilaterally. Moderate spinal stenosis.
Moderate to severe subarticular and foraminal stenosis bilaterally.

L4-5: Mild disc bulging. Severe facet degeneration bilaterally.
Severe spinal stenosis. Moderate to severe subarticular stenosis
bilaterally. Moderate foraminal stenosis bilaterally

L5-S1: Central and left-sided disc protrusion with associated
endplate spurring. Bilateral facet hypertrophy left greater than
right. Moderate to severe subarticular and foraminal stenosis on the
left due to spurring.
IMPRESSION: 1. Extruded disc fragment to the left of midline at L2-3 with
downgoing disc material. Moderate spinal stenosis due to spurring
and disc protrusion. Moderate subarticular stenosis bilaterally
right greater than left
2. Moderate spinal stenosis L3-4 with moderate to severe
subarticular and foraminal stenosis bilaterally
3. Severe spinal stenosis L4-5. Moderate to severe subarticular
stenosis bilaterally
4. Moderate to severe subarticular and foraminal stenosis on the
left at L5-S1 due to disc and osteophyte.

## 2022-04-13 DIAGNOSIS — M5416 Radiculopathy, lumbar region: Secondary | ICD-10-CM | POA: Diagnosis not present

## 2022-04-15 DIAGNOSIS — M9903 Segmental and somatic dysfunction of lumbar region: Secondary | ICD-10-CM | POA: Diagnosis not present

## 2022-04-15 DIAGNOSIS — M5416 Radiculopathy, lumbar region: Secondary | ICD-10-CM | POA: Diagnosis not present

## 2022-04-15 DIAGNOSIS — M9901 Segmental and somatic dysfunction of cervical region: Secondary | ICD-10-CM | POA: Diagnosis not present

## 2022-04-15 DIAGNOSIS — M9904 Segmental and somatic dysfunction of sacral region: Secondary | ICD-10-CM | POA: Diagnosis not present

## 2022-04-15 DIAGNOSIS — M9905 Segmental and somatic dysfunction of pelvic region: Secondary | ICD-10-CM | POA: Diagnosis not present

## 2022-04-16 DIAGNOSIS — M545 Low back pain, unspecified: Secondary | ICD-10-CM | POA: Insufficient documentation

## 2022-04-16 DIAGNOSIS — M79672 Pain in left foot: Secondary | ICD-10-CM | POA: Diagnosis not present

## 2022-04-16 DIAGNOSIS — D2372 Other benign neoplasm of skin of left lower limb, including hip: Secondary | ICD-10-CM | POA: Diagnosis not present

## 2022-04-17 DIAGNOSIS — M5416 Radiculopathy, lumbar region: Secondary | ICD-10-CM | POA: Diagnosis not present

## 2022-04-17 DIAGNOSIS — M1612 Unilateral primary osteoarthritis, left hip: Secondary | ICD-10-CM | POA: Diagnosis not present

## 2022-04-22 DIAGNOSIS — M9901 Segmental and somatic dysfunction of cervical region: Secondary | ICD-10-CM | POA: Diagnosis not present

## 2022-04-22 DIAGNOSIS — M9903 Segmental and somatic dysfunction of lumbar region: Secondary | ICD-10-CM | POA: Diagnosis not present

## 2022-04-22 DIAGNOSIS — M9905 Segmental and somatic dysfunction of pelvic region: Secondary | ICD-10-CM | POA: Diagnosis not present

## 2022-04-22 DIAGNOSIS — M1612 Unilateral primary osteoarthritis, left hip: Secondary | ICD-10-CM | POA: Diagnosis not present

## 2022-04-22 DIAGNOSIS — M9904 Segmental and somatic dysfunction of sacral region: Secondary | ICD-10-CM | POA: Diagnosis not present

## 2022-05-06 DIAGNOSIS — M5416 Radiculopathy, lumbar region: Secondary | ICD-10-CM | POA: Diagnosis not present

## 2022-05-06 DIAGNOSIS — M9901 Segmental and somatic dysfunction of cervical region: Secondary | ICD-10-CM | POA: Diagnosis not present

## 2022-05-06 DIAGNOSIS — M1612 Unilateral primary osteoarthritis, left hip: Secondary | ICD-10-CM | POA: Diagnosis not present

## 2022-05-06 DIAGNOSIS — M9903 Segmental and somatic dysfunction of lumbar region: Secondary | ICD-10-CM | POA: Diagnosis not present

## 2022-05-06 DIAGNOSIS — Z0189 Encounter for other specified special examinations: Secondary | ICD-10-CM | POA: Diagnosis not present

## 2022-05-06 DIAGNOSIS — M9905 Segmental and somatic dysfunction of pelvic region: Secondary | ICD-10-CM | POA: Diagnosis not present

## 2022-05-06 DIAGNOSIS — M9904 Segmental and somatic dysfunction of sacral region: Secondary | ICD-10-CM | POA: Diagnosis not present

## 2022-05-08 DIAGNOSIS — Z79899 Other long term (current) drug therapy: Secondary | ICD-10-CM | POA: Diagnosis not present

## 2022-05-08 DIAGNOSIS — E782 Mixed hyperlipidemia: Secondary | ICD-10-CM | POA: Diagnosis not present

## 2022-05-13 DIAGNOSIS — M9901 Segmental and somatic dysfunction of cervical region: Secondary | ICD-10-CM | POA: Diagnosis not present

## 2022-05-13 DIAGNOSIS — M9905 Segmental and somatic dysfunction of pelvic region: Secondary | ICD-10-CM | POA: Diagnosis not present

## 2022-05-13 DIAGNOSIS — M9903 Segmental and somatic dysfunction of lumbar region: Secondary | ICD-10-CM | POA: Diagnosis not present

## 2022-05-13 DIAGNOSIS — M9904 Segmental and somatic dysfunction of sacral region: Secondary | ICD-10-CM | POA: Diagnosis not present

## 2022-05-20 DIAGNOSIS — M9901 Segmental and somatic dysfunction of cervical region: Secondary | ICD-10-CM | POA: Diagnosis not present

## 2022-05-20 DIAGNOSIS — M9904 Segmental and somatic dysfunction of sacral region: Secondary | ICD-10-CM | POA: Diagnosis not present

## 2022-05-20 DIAGNOSIS — M9905 Segmental and somatic dysfunction of pelvic region: Secondary | ICD-10-CM | POA: Diagnosis not present

## 2022-05-20 DIAGNOSIS — M9903 Segmental and somatic dysfunction of lumbar region: Secondary | ICD-10-CM | POA: Diagnosis not present

## 2022-05-25 DIAGNOSIS — Z0189 Encounter for other specified special examinations: Secondary | ICD-10-CM | POA: Diagnosis not present

## 2022-05-27 DIAGNOSIS — M9904 Segmental and somatic dysfunction of sacral region: Secondary | ICD-10-CM | POA: Diagnosis not present

## 2022-05-27 DIAGNOSIS — M9901 Segmental and somatic dysfunction of cervical region: Secondary | ICD-10-CM | POA: Diagnosis not present

## 2022-05-27 DIAGNOSIS — M9905 Segmental and somatic dysfunction of pelvic region: Secondary | ICD-10-CM | POA: Diagnosis not present

## 2022-05-27 DIAGNOSIS — M9903 Segmental and somatic dysfunction of lumbar region: Secondary | ICD-10-CM | POA: Diagnosis not present

## 2022-05-28 DIAGNOSIS — J329 Chronic sinusitis, unspecified: Secondary | ICD-10-CM | POA: Diagnosis not present

## 2022-05-28 DIAGNOSIS — Z03818 Encounter for observation for suspected exposure to other biological agents ruled out: Secondary | ICD-10-CM | POA: Diagnosis not present

## 2022-05-28 DIAGNOSIS — B9689 Other specified bacterial agents as the cause of diseases classified elsewhere: Secondary | ICD-10-CM | POA: Diagnosis not present

## 2022-05-29 ENCOUNTER — Other Ambulatory Visit: Payer: Self-pay | Admitting: Urology

## 2022-05-29 DIAGNOSIS — N401 Enlarged prostate with lower urinary tract symptoms: Secondary | ICD-10-CM

## 2022-06-03 DIAGNOSIS — M9904 Segmental and somatic dysfunction of sacral region: Secondary | ICD-10-CM | POA: Diagnosis not present

## 2022-06-03 DIAGNOSIS — M9905 Segmental and somatic dysfunction of pelvic region: Secondary | ICD-10-CM | POA: Diagnosis not present

## 2022-06-03 DIAGNOSIS — M9901 Segmental and somatic dysfunction of cervical region: Secondary | ICD-10-CM | POA: Diagnosis not present

## 2022-06-03 DIAGNOSIS — M9903 Segmental and somatic dysfunction of lumbar region: Secondary | ICD-10-CM | POA: Diagnosis not present

## 2022-06-09 ENCOUNTER — Encounter: Payer: Self-pay | Admitting: Gastroenterology

## 2022-06-09 ENCOUNTER — Ambulatory Visit: Payer: Medicare HMO | Admitting: Gastroenterology

## 2022-06-09 ENCOUNTER — Ambulatory Visit: Payer: Medicare HMO | Admitting: Urology

## 2022-06-09 VITALS — BP 132/83 | HR 67 | Temp 97.9°F | Ht 72.0 in | Wt 195.0 lb

## 2022-06-09 DIAGNOSIS — R140 Abdominal distension (gaseous): Secondary | ICD-10-CM

## 2022-06-09 DIAGNOSIS — R1013 Epigastric pain: Secondary | ICD-10-CM

## 2022-06-09 MED ORDER — ESOMEPRAZOLE MAGNESIUM 40 MG PO CPDR: 40.0000 mg | DELAYED_RELEASE_CAPSULE | Freq: Every day | ORAL | 5 refills | Status: DC

## 2022-06-09 NOTE — Progress Notes (Signed)
Gastroenterology Consultation  Referring Provider:     Marguarite Arbour, MD Primary Care Physician:  Marguarite Arbour, MD Primary Gastroenterologist:  Dr. Servando Snare     Reason for Consultation:     Upset stomach        HPI:   Mitchell Powell is a 72 y.o. y/o male referred for consultation & management of upset stomach by Dr. Judithann Sheen, Duane Lope, MD. This patient comes in today after being seen by me back in 2007 then followed up with Dr. Mechele Collin from 2011-2016 with a diagnosis of alcohol induced pancreatitis.  Patient then followed up with me in 2020 colonoscopy and was recommended to have repeat colonoscopy in 5 years.  The patient was most recently seen back in [place] clinic back in October 2023 with a report of dyspepsia at that time patient the patient was seen by a PA who diagnosed the patient with postinfectious irritable bowel syndrome.  He reports that he had been treated with antibiotics back in July with a Z-Pak and Augmentin and then switched over to Levaquin for a couple of weeks.  The patient states that he was not happy with his care there and would like to switch back to me. At the time of his last visit with GI back in October the patient reported 3 months of intermittent dyspepsia with increased bowel sounds.  The patient reports that he eats a lot of salads with fruits and vegetables and chews gum daily.  He was reporting that the symptoms were mostly in the mornings and afternoon. The patient reports that he is lost a few pounds over the last 6 months.  He states this is without trying.  He also reports that he has some abdominal discomfort and is not helped with his PPI twice a day and feels like he still has acid reflux.    Past Medical History:  Diagnosis Date   Anxiety    Depression    GERD (gastroesophageal reflux disease)    Hyperlipidemia    Hypertension    Hypothyroidism     Past Surgical History:  Procedure Laterality Date   ABDOMINAL AORTIC ANEURYSM REPAIR      COLONOSCOPY WITH PROPOFOL N/A 05/10/2018   Procedure: COLONOSCOPY WITH PROPOFOL;  Surgeon: Midge Minium, MD;  Location: [facility] ENDOSCOPY;  Service: Endoscopy;  Laterality: N/A;   TONSILLECTOMY AND ADENOIDECTOMY      Prior to Admission medications   Medication Sig Start Date End Date Taking? Authorizing Provider  benazepril (LOTENSIN) 20 MG tablet Take by mouth. 03/10/16   [provider]  clonazePAM Scarlette Calico) 0.5 MG tablet  09/15/16   [provider]  finasteride (PROSCAR) 5 MG tablet TAKE 1 TABLET EVERY DAY 05/29/22   McGowan, Carollee Herter A, PA-C  FLUoxetine (PROZAC) 20 MG capsule Take by mouth. 03/10/16   [provider]  fluticasone (FLONASE) 50 MCG/ACT nasal spray Place into the nose. 10/13/16   [provider]  levothyroxine (SYNTHROID, LEVOTHROID) 50 MCG tablet Take 50 mcg by mouth daily before breakfast.    [provider]  loratadine (CLARITIN) 10 MG tablet Take by mouth.    [provider]  losartan (COZAAR) 50 MG tablet Take 50 mg by mouth daily. 07/08/21   [provider]  Omega-3 Fatty Acids (FISH OIL) 1000 MG CPDR Take by mouth.    [provider]  omeprazole (PRILOSEC) 20 MG capsule  09/20/16   [provider]  pantoprazole (PROTONIX) 40 MG tablet  09/14/17  [provider]  pravastatin (PRAVACHOL) 40 MG tablet Take by mouth. 03/10/16   [provider]  tadalafil (CIALIS) 5 MG tablet Take 1 tablet (5 mg total) by mouth daily as needed for erectile dysfunction. 03/13/22   Michiel Cowboy A, PA-C  tamsulosin (FLOMAX) 0.4 MG CAPS capsule Take by mouth. 08/25/21 08/25/22  [provider]    Family History  Problem Relation Age of Onset   Varicose Veins Father    Heart attack Brother    Diabetes Brother      Social History   Tobacco Use   Smoking status: Never    Passive exposure: Never   Smokeless tobacco: Never  Substance Use Topics   Alcohol use: Yes   Drug use: No     Allergies as of 06/09/2022 - Review Complete 04/01/2022  Allergen Reaction Noted   Prednisone  12/26/2014    Review of Systems:    All systems reviewed and negative except where noted in HPI.   Physical Exam:  There were no vitals taken for this visit. No LMP for male patient. General:   Alert,  Well-developed, well-nourished, pleasant and cooperative in NAD Head:  Normocephalic and atraumatic. Eyes:  Sclera clear, no icterus.   Conjunctiva pink. Ears:  Normal auditory acuity. Neck:  Supple; no masses or thyromegaly. Rectal:  Deferred.  Neurologic:  Alert and oriented x3;  grossly normal neurologically. Skin:  Intact without significant lesions or rashes.  No jaundice. Lymph Nodes:  No significant cervical adenopathy. Psych:  Alert and cooperative. Normal mood and affect.  Imaging Studies: No results found.  Assessment and Plan:   Mitchell Powell is a 72 y.o. y/o male who comes in today with acid reflux despite taking a PPI twice a day.  The patient also reports a lot of abdominal noises.  This has all started since he was on antibiotics for sinus infections and was on multiple antibiotics for prolonged period of time.  The patient will be switched to Nexium and be set up for an upper endoscopy to look for any other cause of his nonresponse to his PPI.  The patient has been explained the plan and agrees with it.    Midge Minium, MD. Clementeen Graham    Note: This dictation was prepared with Dragon dictation along with smaller phrase technology. Any transcriptional errors that result from this process are unintentional.

## 2022-06-10 DIAGNOSIS — M9903 Segmental and somatic dysfunction of lumbar region: Secondary | ICD-10-CM | POA: Diagnosis not present

## 2022-06-10 DIAGNOSIS — M9901 Segmental and somatic dysfunction of cervical region: Secondary | ICD-10-CM | POA: Diagnosis not present

## 2022-06-10 DIAGNOSIS — M9904 Segmental and somatic dysfunction of sacral region: Secondary | ICD-10-CM | POA: Diagnosis not present

## 2022-06-10 DIAGNOSIS — M9905 Segmental and somatic dysfunction of pelvic region: Secondary | ICD-10-CM | POA: Diagnosis not present

## 2022-06-15 DIAGNOSIS — M1612 Unilateral primary osteoarthritis, left hip: Secondary | ICD-10-CM | POA: Diagnosis not present

## 2022-06-18 DIAGNOSIS — M1612 Unilateral primary osteoarthritis, left hip: Secondary | ICD-10-CM | POA: Diagnosis not present

## 2022-06-18 DIAGNOSIS — G4733 Obstructive sleep apnea (adult) (pediatric): Secondary | ICD-10-CM | POA: Diagnosis not present

## 2022-06-18 DIAGNOSIS — E7800 Pure hypercholesterolemia, unspecified: Secondary | ICD-10-CM | POA: Diagnosis not present

## 2022-06-18 DIAGNOSIS — F32A Depression, unspecified: Secondary | ICD-10-CM | POA: Diagnosis not present

## 2022-06-18 DIAGNOSIS — J449 Chronic obstructive pulmonary disease, unspecified: Secondary | ICD-10-CM | POA: Diagnosis not present

## 2022-06-18 DIAGNOSIS — K219 Gastro-esophageal reflux disease without esophagitis: Secondary | ICD-10-CM | POA: Diagnosis not present

## 2022-06-18 DIAGNOSIS — I724 Aneurysm of artery of lower extremity: Secondary | ICD-10-CM | POA: Diagnosis not present

## 2022-06-18 DIAGNOSIS — M25752 Osteophyte, left hip: Secondary | ICD-10-CM | POA: Diagnosis not present

## 2022-06-18 DIAGNOSIS — I10 Essential (primary) hypertension: Secondary | ICD-10-CM | POA: Diagnosis not present

## 2022-06-20 ENCOUNTER — Emergency Department: Payer: Medicare HMO

## 2022-06-20 ENCOUNTER — Emergency Department
Admission: EM | Admit: 2022-06-20 | Discharge: 2022-06-20 | Disposition: A | Discharge status: Home / Self Care | Payer: Medicare HMO | Attending: Emergency Medicine | Admitting: Emergency Medicine

## 2022-06-20 DIAGNOSIS — R509 Fever, unspecified: Secondary | ICD-10-CM | POA: Diagnosis not present

## 2022-06-20 DIAGNOSIS — J439 Emphysema, unspecified: Secondary | ICD-10-CM | POA: Diagnosis not present

## 2022-06-20 DIAGNOSIS — Z1152 Encounter for screening for COVID-19: Secondary | ICD-10-CM | POA: Insufficient documentation

## 2022-06-20 DIAGNOSIS — Z21 Asymptomatic human immunodeficiency virus [HIV] infection status: Secondary | ICD-10-CM | POA: Insufficient documentation

## 2022-06-20 DIAGNOSIS — R059 Cough, unspecified: Secondary | ICD-10-CM | POA: Diagnosis not present

## 2022-06-20 DIAGNOSIS — R2242 Localized swelling, mass and lump, left lower limb: Secondary | ICD-10-CM | POA: Diagnosis not present

## 2022-06-20 DIAGNOSIS — J449 Chronic obstructive pulmonary disease, unspecified: Secondary | ICD-10-CM | POA: Insufficient documentation

## 2022-06-20 DIAGNOSIS — I10 Essential (primary) hypertension: Secondary | ICD-10-CM | POA: Diagnosis not present

## 2022-06-20 DIAGNOSIS — J069 Acute upper respiratory infection, unspecified: Secondary | ICD-10-CM

## 2022-06-20 DIAGNOSIS — D649 Anemia, unspecified: Secondary | ICD-10-CM | POA: Diagnosis not present

## 2022-06-20 DIAGNOSIS — E039 Hypothyroidism, unspecified: Secondary | ICD-10-CM | POA: Diagnosis not present

## 2022-06-20 DIAGNOSIS — R7989 Other specified abnormal findings of blood chemistry: Secondary | ICD-10-CM | POA: Diagnosis not present

## 2022-06-20 DIAGNOSIS — R600 Localized edema: Secondary | ICD-10-CM | POA: Diagnosis not present

## 2022-06-20 DIAGNOSIS — Z856 Personal history of leukemia: Secondary | ICD-10-CM | POA: Insufficient documentation

## 2022-06-20 DIAGNOSIS — D72829 Elevated white blood cell count, unspecified: Secondary | ICD-10-CM | POA: Insufficient documentation

## 2022-06-20 LAB — COMPREHENSIVE METABOLIC PANEL
ALT: 16 U/L (ref 0–44)
AST: 28 U/L (ref 15–41)
Albumin: 3.6 g/dL (ref 3.5–5.0)
Alkaline Phosphatase: 56 U/L (ref 38–126)
Anion gap: 8 (ref 5–15)
BUN: 16 mg/dL (ref 8–23)
CO2: 25 mmol/L (ref 22–32)
Calcium: 8.9 mg/dL (ref 8.9–10.3)
Chloride: 101 mmol/L (ref 98–111)
Creatinine, Ser: 0.96 mg/dL (ref 0.61–1.24)
GFR, Estimated: >60 mL/min (ref >60)
Glucose, Bld: 103 mg/dL — ABNORMAL HIGH (ref 70–99)
Potassium: 3.7 mmol/L (ref 3.5–5.1)
Sodium: 134 mmol/L — ABNORMAL LOW (ref 135–145)
Total Bilirubin: 1 mg/dL (ref 0.3–1.2)
Total Protein: 6.8 g/dL (ref 6.5–8.1)

## 2022-06-20 LAB — URINALYSIS, ROUTINE W REFLEX MICROSCOPIC
Bacteria, UA: NONE SEEN
Bilirubin Urine: NEGATIVE
Glucose, UA: NEGATIVE mg/dL
Ketones, ur: NEGATIVE mg/dL
Leukocytes,Ua: NEGATIVE
Nitrite: NEGATIVE
Protein, ur: NEGATIVE mg/dL
Specific Gravity, Urine: 1.012 (ref 1.005–1.030)
Squamous Epithelial / HPF: NONE SEEN /HPF (ref 0–5)
pH: 5 (ref 5.0–8.0)

## 2022-06-20 LAB — RESP PANEL BY RT-PCR (FLU A&B, COVID) ARPGX2
Influenza A by PCR: NEGATIVE
Influenza B by PCR: NEGATIVE
SARS Coronavirus 2 by RT PCR: NEGATIVE

## 2022-06-20 LAB — CBC
HCT: 39.4 % (ref 39.0–52.0)
Hemoglobin: 12.7 g/dL — ABNORMAL LOW (ref 13.0–17.0)
MCH: 31.8 pg (ref 26.0–34.0)
MCHC: 32.2 g/dL (ref 30.0–36.0)
MCV: 98.7 fL (ref 80.0–100.0)
Platelets: 168 10*3/uL (ref 150–400)
RBC: 3.99 MIL/uL — ABNORMAL LOW (ref 4.22–5.81)
RDW: 12.6 % (ref 11.5–15.5)
WBC: 11.8 10*3/uL — ABNORMAL HIGH (ref 4.0–10.5)
nRBC: 0 % (ref 0.0–0.2)

## 2022-06-20 LAB — D-DIMER, QUANTITATIVE: D-Dimer, Quant: 1.74 ug/mL-FEU — ABNORMAL HIGH (ref 0.00–0.50)

## 2022-06-20 MED ORDER — IOHEXOL 350 MG/ML SOLN
75.0000 mL | Freq: Once | INTRAVENOUS | Status: AC | PRN
  Administered 2022-06-20: 75 mL via INTRAVENOUS

## 2022-06-20 MED ORDER — ACETAMINOPHEN 325 MG PO TABS
650.0000 mg | ORAL_TABLET | Freq: Once | ORAL | Status: AC
  Administered 2022-06-20: 650 mg via ORAL
  Filled 2022-06-20: qty 2

## 2022-06-20 NOTE — ED Provider Notes (Signed)
[facility] Provider Note    Event Date/Time   First MD Initiated Contact with Patient 06/20/22 1108     (approximate)   History   Fever   HPI  Mitchell Powell is a 72 y.o. male history of leukemia, HIV, substance abuse, hepatitis, "blood clots".  Additionally has a history of hypertension AAA hypothyroidism COPD  Of note, discussing his past medical history, the patient reports that he has never had a blood clot and has never taken a blood thinner before.  He reports that this seems to consistently follow his medical record but denies ever having a history of a blood clot, DVT or embolism  Patient had routine outpatient left hip surgery on Thursday at [facility] with [facility].  He reports he went home the same day of the surgery has been recovering well the area of the left hip is a little bit sore but seems to be healing well.  Yesterday he started noticed a little bit of a runny nose cough and chest congestion.  No shortness of breath.  He called [facility] and he reports they were not too concerned about it.  He noticed a low-grade fever slight chills.  He has some very slight swelling around the left thigh where he had surgery.  A little bit of bruising there.  Reports it is sore but he is able to walk on it and is doing well.  No abdominal pain no nausea or vomiting.  No chest pain or shortness of breath.  Does report a cough and congestion.  No rashes other than the bruising in his surgical site     Physical Exam   Triage Vital Signs: ED Triage Vitals  Enc Vitals Group     BP 06/20/22 1030 (!) 120/35     Pulse Rate 06/20/22 1030 99     Resp 06/20/22 1030 18     Temp 06/20/22 1030 (!) 100.4 F (38 C)     Temp Source 06/20/22 1030 Oral     SpO2 06/20/22 1030 94 %     Weight 06/20/22 1033 195 lb (88.5 kg)     Height --      Head Circumference --      Peak Flow --      Pain Score 06/20/22 1033 0     Pain Loc --      Pain Edu?  --      Excl. in GC? --     Most recent vital signs: Vitals:   06/20/22 1430 06/20/22 1539  BP:  111/61  Pulse:  80  Resp:  (!) 23  Temp: 98.2 F (36.8 C)   SpO2:  96%     General: Awake, no distress.  Very pleasant.  Very mild nasal congestion. CV:  Good peripheral perfusion.  Normal tones and rate Resp:  Normal effort.  Clear lungs bilaterally.  Occasional slight dry cough.  No increased work of breathing.  No crackles rales or rhonchi Abd:  No distention.  Soft nontender nondistended throughout Other:  Right lower extremity strong pulses atraumatic moves well.  Left lower extremity strong pulses including dorsalis pedis posterior tibial.  The area of his surgery of over the left hip trochanteric region demonstrates an approximately 10 cm sutured laceration.  Bandage taken down and replaced.  There is no erythema no warmth or purulent drainage.  The incision appears quite well-healing.  In addition, he has very slight edema in this area and some bruising but  no skin erythema or warmth.  Does not appear to have evidence of a surgical sinus infection by visual inspection  No venous cords or congestion.  No swelling in the upper extremities bilaterally.   ED Results / Procedures / Treatments   Labs (all labs ordered are listed, but only abnormal results are displayed) Labs Reviewed  CBC - Abnormal; Notable for the following components:      Result Value   WBC 11.8 (*)    RBC 3.99 (*)    Hemoglobin 12.7 (*)    All other components within normal limits  COMPREHENSIVE METABOLIC PANEL - Abnormal; Notable for the following components:   Sodium 134 (*)    Glucose, Bld 103 (*)    All other components within normal limits  URINALYSIS, ROUTINE W REFLEX MICROSCOPIC - Abnormal; Notable for the following components:   Color, Urine YELLOW (*)    APPearance CLEAR (*)    Hgb urine dipstick SMALL (*)    All other components within normal limits  D-DIMER, QUANTITATIVE - Abnormal; Notable  for the following components:   D-Dimer, Quant 1.74 (*)    All other components within normal limits  RESP PANEL BY RT-PCR (FLU A&B, COVID) ARPGX2  CULTURE, BLOOD (ROUTINE X 2)  CULTURE, BLOOD (ROUTINE X 2)  URINE CULTURE   Interpreted as mild leukocytosis, mild anemia (somewhat expected), and elevated d-dimer with no PE on CT   RADIOLOGY Chest x-ray interpreted as negative for acute finding.  Changes consistent with chronic emphysema     PROCEDURES:  Critical Care performed: No  Procedures   MEDICATIONS ORDERED IN ED: Medications  acetaminophen (TYLENOL) tablet 650 mg (650 mg Oral Given 06/20/22 1255)  iohexol (OMNIPAQUE) 350 MG/ML injection 75 mL (75 mLs Intravenous Contrast Given 06/20/22 1347)     IMPRESSION / MDM / ASSESSMENT AND PLAN / ED COURSE  I reviewed the triage vital signs and the nursing notes.                              Differential diagnosis includes, but is not limited to, postoperative fever.  Given the clinical symptoms he presents it seems he may have upper respiratory infection runny nose dry cough.  Lung sounds are clear lung fields are clear oxygen saturation normal.  Vital signs are normal with exception to a low-grade fever of 100.4.  He appears quite well-appearing his surgical site appears quite clean.  However given low-grade fever a few days postop, will evaluate exclude DVT, PE given associated cough via D-dimer as I do deem him to be relatively low risk for thromboembolism based on the clinical history, no hypoxia no tachycardia.  His surgical site appears clean and intact.  Check urinalysis as well.  Patient's presentation is most consistent with acute complicated illness / injury requiring diagnostic workup.    ED eval reassuring. Patient alert, stable vitals. No distress. Reports mild coryza and cough, but given post-op extensiver eval for fever. No DVT/PE, evidence of post-op infection at surgical site, etc. Suspect likely secondary to  healting, inflamation or viral uri. Reassuring. Abx not indicated. UCx and BCx sent, suspect will be negative.  Return precautions and treatment recommendations and follow-up discussed with the patient who is agreeable with the plan.  Patient has f/u with [facility] on Wed this week. Attempted via [facility] to reach on call for [facility], unable to contact.        FINAL CLINICAL IMPRESSION(S) /  ED DIAGNOSES   Final diagnoses:  Fever, unspecified fever cause  Upper respiratory tract infection, unspecified type     Rx / DC Orders   ED Discharge Orders     None        Note:  This document was prepared using Dragon voice recognition software and may include unintentional dictation errors.   Sharyn Creamer, MD 06/20/22 2204

## 2022-06-20 NOTE — ED Triage Notes (Signed)
Pt presents to the ED due to fevers and chills. Pt states he woke up this morning not feeling well. Pt states he had hip surgery on Thursday and was prescribed Norco. Pt denies pain or warmth to touch to incision site. Pt A&Ox4

## 2022-06-21 LAB — URINE CULTURE: Culture: NO GROWTH

## 2022-06-21 LAB — CULTURE, BLOOD (ROUTINE X 2): Special Requests: ADEQUATE

## 2022-06-23 LAB — CULTURE, BLOOD (ROUTINE X 2): Culture: NO GROWTH

## 2022-06-24 ENCOUNTER — Telehealth: Payer: Self-pay

## 2022-06-24 LAB — CULTURE, BLOOD (ROUTINE X 2)

## 2022-06-24 NOTE — Telephone Encounter (Signed)
Transition Care Management Follow-up Telephone Call Date of discharge and from where: 06/20/2022, [facility] How have you been since you were released from the hospital? Patient stated he is feeling better. Any questions or concerns? No  Items Reviewed: Did the pt receive and understand the discharge instructions provided? Yes  Medications obtained and verified? Yes  Other? No  Any new allergies since your discharge? No  Dietary orders reviewed? Yes Do you have support at home? Yes   Follow up appointments reviewed:  PCP Hospital f/u appt confirmed? No  Scheduled to see  on  @ . Specialist Hospital f/u appt confirmed? Yes  Scheduled to see [facility] on 06/25/2022 @ 1pm. Are transportation arrangements needed? No  If their condition worsens, is the pt aware to call PCP or go to the Emergency Dept.? Yes Was the patient provided with contact information for the PCP's office or ED? Yes Was to pt encouraged to call back with questions or concerns? Yes   [facility]  [facility] Population Health Community Resource Care Guide   ??[email or web address]  ?? [phone]   Website: [email or web address]  [email or web address]

## 2022-06-25 DIAGNOSIS — M25652 Stiffness of left hip, not elsewhere classified: Secondary | ICD-10-CM | POA: Diagnosis not present

## 2022-06-25 DIAGNOSIS — M25552 Pain in left hip: Secondary | ICD-10-CM | POA: Diagnosis not present

## 2022-06-25 LAB — CULTURE, BLOOD (ROUTINE X 2): Culture: NO GROWTH

## 2022-07-01 DIAGNOSIS — J019 Acute sinusitis, unspecified: Secondary | ICD-10-CM | POA: Diagnosis not present

## 2022-07-02 DIAGNOSIS — M25652 Stiffness of left hip, not elsewhere classified: Secondary | ICD-10-CM | POA: Diagnosis not present

## 2022-07-02 DIAGNOSIS — M25552 Pain in left hip: Secondary | ICD-10-CM | POA: Diagnosis not present

## 2022-07-07 DIAGNOSIS — M25552 Pain in left hip: Secondary | ICD-10-CM | POA: Diagnosis not present

## 2022-07-07 DIAGNOSIS — M25652 Stiffness of left hip, not elsewhere classified: Secondary | ICD-10-CM | POA: Diagnosis not present

## 2022-07-09 DIAGNOSIS — M25652 Stiffness of left hip, not elsewhere classified: Secondary | ICD-10-CM | POA: Diagnosis not present

## 2022-07-09 DIAGNOSIS — M25552 Pain in left hip: Secondary | ICD-10-CM | POA: Diagnosis not present

## 2022-07-15 DIAGNOSIS — M9904 Segmental and somatic dysfunction of sacral region: Secondary | ICD-10-CM | POA: Diagnosis not present

## 2022-07-15 DIAGNOSIS — M9905 Segmental and somatic dysfunction of pelvic region: Secondary | ICD-10-CM | POA: Diagnosis not present

## 2022-07-15 DIAGNOSIS — M25652 Stiffness of left hip, not elsewhere classified: Secondary | ICD-10-CM | POA: Diagnosis not present

## 2022-07-15 DIAGNOSIS — M9901 Segmental and somatic dysfunction of cervical region: Secondary | ICD-10-CM | POA: Diagnosis not present

## 2022-07-15 DIAGNOSIS — M9903 Segmental and somatic dysfunction of lumbar region: Secondary | ICD-10-CM | POA: Diagnosis not present

## 2022-07-15 DIAGNOSIS — M25552 Pain in left hip: Secondary | ICD-10-CM | POA: Diagnosis not present

## 2022-07-17 DIAGNOSIS — J069 Acute upper respiratory infection, unspecified: Secondary | ICD-10-CM | POA: Diagnosis not present

## 2022-07-17 DIAGNOSIS — M25652 Stiffness of left hip, not elsewhere classified: Secondary | ICD-10-CM | POA: Diagnosis not present

## 2022-07-17 DIAGNOSIS — M25552 Pain in left hip: Secondary | ICD-10-CM | POA: Diagnosis not present

## 2022-07-22 DIAGNOSIS — M25652 Stiffness of left hip, not elsewhere classified: Secondary | ICD-10-CM | POA: Diagnosis not present

## 2022-07-22 DIAGNOSIS — M9904 Segmental and somatic dysfunction of sacral region: Secondary | ICD-10-CM | POA: Diagnosis not present

## 2022-07-22 DIAGNOSIS — M9901 Segmental and somatic dysfunction of cervical region: Secondary | ICD-10-CM | POA: Diagnosis not present

## 2022-07-22 DIAGNOSIS — M9903 Segmental and somatic dysfunction of lumbar region: Secondary | ICD-10-CM | POA: Diagnosis not present

## 2022-07-22 DIAGNOSIS — M25552 Pain in left hip: Secondary | ICD-10-CM | POA: Diagnosis not present

## 2022-07-22 DIAGNOSIS — M9905 Segmental and somatic dysfunction of pelvic region: Secondary | ICD-10-CM | POA: Diagnosis not present

## 2022-07-24 DIAGNOSIS — M25552 Pain in left hip: Secondary | ICD-10-CM | POA: Diagnosis not present

## 2022-07-24 DIAGNOSIS — M25652 Stiffness of left hip, not elsewhere classified: Secondary | ICD-10-CM | POA: Diagnosis not present

## 2022-07-27 DIAGNOSIS — F411 Generalized anxiety disorder: Secondary | ICD-10-CM | POA: Diagnosis not present

## 2022-07-27 DIAGNOSIS — Z96642 Presence of left artificial hip joint: Secondary | ICD-10-CM | POA: Diagnosis not present

## 2022-07-27 DIAGNOSIS — J019 Acute sinusitis, unspecified: Secondary | ICD-10-CM | POA: Diagnosis not present

## 2022-07-29 DIAGNOSIS — M9901 Segmental and somatic dysfunction of cervical region: Secondary | ICD-10-CM | POA: Diagnosis not present

## 2022-07-29 DIAGNOSIS — M9904 Segmental and somatic dysfunction of sacral region: Secondary | ICD-10-CM | POA: Diagnosis not present

## 2022-07-29 DIAGNOSIS — M9903 Segmental and somatic dysfunction of lumbar region: Secondary | ICD-10-CM | POA: Diagnosis not present

## 2022-07-29 DIAGNOSIS — M9905 Segmental and somatic dysfunction of pelvic region: Secondary | ICD-10-CM | POA: Diagnosis not present

## 2022-08-05 DIAGNOSIS — I10 Essential (primary) hypertension: Secondary | ICD-10-CM | POA: Diagnosis not present

## 2022-08-05 DIAGNOSIS — E039 Hypothyroidism, unspecified: Secondary | ICD-10-CM | POA: Diagnosis not present

## 2022-08-05 DIAGNOSIS — M9901 Segmental and somatic dysfunction of cervical region: Secondary | ICD-10-CM | POA: Diagnosis not present

## 2022-08-05 DIAGNOSIS — M9904 Segmental and somatic dysfunction of sacral region: Secondary | ICD-10-CM | POA: Diagnosis not present

## 2022-08-05 DIAGNOSIS — Z79899 Other long term (current) drug therapy: Secondary | ICD-10-CM | POA: Diagnosis not present

## 2022-08-05 DIAGNOSIS — M9903 Segmental and somatic dysfunction of lumbar region: Secondary | ICD-10-CM | POA: Diagnosis not present

## 2022-08-05 DIAGNOSIS — M9905 Segmental and somatic dysfunction of pelvic region: Secondary | ICD-10-CM | POA: Diagnosis not present

## 2022-08-05 DIAGNOSIS — E782 Mixed hyperlipidemia: Secondary | ICD-10-CM | POA: Diagnosis not present

## 2022-08-05 DIAGNOSIS — Z125 Encounter for screening for malignant neoplasm of prostate: Secondary | ICD-10-CM | POA: Diagnosis not present

## 2022-08-08 ENCOUNTER — Ambulatory Visit
Admission: EM | Admit: 2022-08-08 | Discharge: 2022-08-08 | Disposition: A | Discharge status: Home / Self Care | Payer: Medicare HMO | Attending: Family | Admitting: Family

## 2022-08-08 DIAGNOSIS — U071 COVID-19: Secondary | ICD-10-CM | POA: Insufficient documentation

## 2022-08-08 DIAGNOSIS — R0981 Nasal congestion: Secondary | ICD-10-CM

## 2022-08-08 DIAGNOSIS — J029 Acute pharyngitis, unspecified: Secondary | ICD-10-CM

## 2022-08-08 NOTE — Discharge Instructions (Signed)
-  Your COVID test is still pending. -Stay hydrated and take Tylenol and Mucinex as needed. -You can use gargle salty water as needed for your symptoms -Report new or worsening symptoms to your primary care provider or local emergency room.

## 2022-08-08 NOTE — ED Triage Notes (Signed)
Patient to Urgent Care with complaints of headaches, increased mucus production, generalized body aches, and cough. Reports chest and nasal congestion.  Reports symptoms started four days ago.   Denies any known fevers. Has been taking mucinex/ otc cough medications.

## 2022-08-08 NOTE — ED Provider Notes (Signed)
Mitchell Powell    CSN: [id number] Arrival date & time: 08/08/22  3086      History   Chief Complaint Chief Complaint  Patient presents with   Headache   Nasal Congestion   Cough    HPI Mitchell Powell is a 72 y.o. male who is here this morning with complaints of chronic nasal congestion, headache and dry cough.  Patient reports history of nasal congestion since age 82, has upcoming appointment with his primary care provider on Wednesday and ENT on Friday next week.  He started having scratchy throat this morning, and has been experiencing dry cough for a few days now.  Patient reports no fever, chills, dizziness, chest pain or shortness of breath.  Has a past medical history of COPD (former smoker), hypothyroidism and stents placement among others.  All his symptoms patient has been taking Mucinex as needed and drinking water.  Past Medical History:  Diagnosis Date   Anxiety    Depression    GERD (gastroesophageal reflux disease)    Hyperlipidemia    Hypertension    Hypothyroidism     Patient Active Problem List   Diagnosis Date Noted   Low back pain 04/16/2022   Rash 07/08/2021   Bradycardia 11/30/2018   Knee pain 11/22/2018   Acquired hypothyroidism 07/27/2018   Personal history of colonic polyps    Polyp of sigmoid colon    Chest pain with high risk for cardiac etiology 11/12/2017   GERD (gastroesophageal reflux disease) 10/27/2017   Hyperlipidemia 10/27/2017   Hypertension 10/27/2016   AAA (abdominal aortic aneurysm) without rupture (HCC) 10/27/2016   Popliteal artery aneurysm (HCC) 10/27/2016   Femoral artery aneurysm, bilateral (HCC) 10/27/2016   OSA on CPAP 01/21/2016   SOB (shortness of breath) on exertion 10/31/2014   Anxiety disorder 10/25/2013   COPD (chronic obstructive pulmonary disease) (HCC) 10/25/2013   Depression 10/25/2013    Past Surgical History:  Procedure Laterality Date   ABDOMINAL AORTIC ANEURYSM REPAIR     COLONOSCOPY WITH  PROPOFOL N/A 05/10/2018   Procedure: COLONOSCOPY WITH PROPOFOL;  Surgeon: Midge Minium, MD;  Location: [facility] ENDOSCOPY;  Service: Endoscopy;  Laterality: N/A;   JOINT REPLACEMENT     TONSILLECTOMY AND ADENOIDECTOMY         Home Medications    Prior to Admission medications   Medication Sig Start Date End Date Taking? Authorizing Provider  benazepril (LOTENSIN) 20 MG tablet Take by mouth. 03/10/16   [provider]  clonazePAM Scarlette Calico) 0.5 MG tablet  09/15/16   [provider]  esomeprazole (NEXIUM) 40 MG capsule Take 1 capsule (40 mg total) by mouth daily. 06/09/22   Midge Minium, MD  finasteride (PROSCAR) 5 MG tablet TAKE 1 TABLET EVERY DAY 05/29/22   McGowan, Carollee Herter A, PA-C  FLUoxetine (PROZAC) 20 MG capsule Take by mouth. 03/10/16   [provider]  fluticasone (FLONASE) 50 MCG/ACT nasal spray Place into the nose. 10/13/16   [provider]  levothyroxine (SYNTHROID, LEVOTHROID) 50 MCG tablet Take 50 mcg by mouth daily before breakfast.    [provider]  loratadine (CLARITIN) 10 MG tablet Take by mouth.    [provider]  losartan (COZAAR) 50 MG tablet Take 50 mg by mouth daily. 07/08/21   [provider]  Omega-3 Fatty Acids (FISH OIL) 1000 MG CPDR Take by mouth.    [provider]  pravastatin (PRAVACHOL) 40 MG tablet Take by mouth. 03/10/16   [provider]  tadalafil (CIALIS)  5 MG tablet Take 1 tablet (5 mg total) by mouth daily as needed for erectile dysfunction. 03/13/22   Michiel Cowboy A, PA-C  tamsulosin (FLOMAX) 0.4 MG CAPS capsule Take by mouth. 08/25/21 08/25/22  [provider]    Family History Family History  Problem Relation Age of Onset   Varicose Veins Father    Heart attack Brother    Diabetes Brother     Social History Social History   Tobacco Use   Smoking status: Never    Passive exposure: Never   Smokeless tobacco: Never  Substance Use Topics   Alcohol use: Yes    Drug use: No     Allergies   Prednisone   Review of Systems Review of Systems  Constitutional: Negative.   HENT:  Positive for congestion.   Eyes: Negative.   Respiratory:  Positive for cough.   Cardiovascular: Negative.   Gastrointestinal: Negative.   Endocrine: Negative.   Neurological:  Positive for headaches.     Physical Exam Triage Vital Signs ED Triage Vitals  Enc Vitals Group     BP 08/08/22 0928 (!) 144/78     Pulse Rate 08/08/22 0928 69     Resp 08/08/22 0928 20     Temp 08/08/22 0928 97.7 F (36.5 C)     Temp src --      SpO2 08/08/22 0928 97 %     Weight --      Height --      Head Circumference --      Peak Flow --      Pain Score 08/08/22 0924 4     Pain Loc --      Pain Edu? --      Excl. in GC? --    No data found.  Updated Vital Signs BP (!) 144/78   Pulse 69   Temp 97.7 F (36.5 C)   Resp 20   SpO2 97%   Visual Acuity Right Eye Distance:   Left Eye Distance:   Bilateral Distance:    Right Eye Near:   Left Eye Near:    Bilateral Near:     Physical Exam Constitutional:      Appearance: He is well-developed and normal weight.  HENT:     Head: Normocephalic and atraumatic.     Mouth/Throat:     Mouth: Mucous membranes are moist.  Eyes:     Extraocular Movements: Extraocular movements intact.  Cardiovascular:     Rate and Rhythm: Normal rate and regular rhythm.  Pulmonary:     Effort: Pulmonary effort is normal.     Breath sounds: Normal breath sounds.  Abdominal:     General: Bowel sounds are normal.     Palpations: Abdomen is soft.  Musculoskeletal:     Cervical back: Normal range of motion.  Skin:    General: Skin is warm and dry.  Neurological:     Mental Status: He is alert and oriented to person, place, and time.  Psychiatric:        Mood and Affect: Mood normal.      UC Treatments / Results  Labs (all labs ordered are listed, but only abnormal results are displayed) Labs Reviewed  SARS CORONAVIRUS 2 (TAT  6-24 HRS)    EKG   Radiology No results found.  Procedures Procedures (including critical care time)  Medications Ordered in UC Medications - No data to display  Initial Impression / Assessment and Plan / UC Course  I have  reviewed the triage vital signs and the nursing notes.  Pertinent labs & imaging results that were available during my care of the patient were reviewed by me and considered in my medical decision making (see chart for details).     Mr. Painton is a 72 year old male with history of chronic nasal congestion who has recurrently of the condition.  He has upcoming appointment with ENT on Friday the next week, he also has appointment with his primary care provider on Wednesday next week.  He has scratchy throat as well and has been experiencing intermittent headaches.  Patient to take Tylenol as needed for his symptoms as well as Mucinex as needed. He is also reminded to gargle warm salty water as needed for his symptoms.  Verbalized understanding.  COVID test done, results pending.  Reminded to report new or worsening symptoms to local ED or his primary care provider office.  Verbalized understanding.  Final Clinical Impressions(s) / UC Diagnoses   Final diagnoses:  Sore throat  Chronic nasal congestion     Discharge Instructions      -Your COVID test is still pending. -Stay hydrated and take Tylenol and Mucinex as needed. -You can use gargle salty water as needed for your symptoms -Report new or worsening symptoms to your primary care provider or local emergency room.      ED Prescriptions   None    PDMP not reviewed this encounter.   Eleonore Chiquito, FNP 08/08/22 1007

## 2022-08-09 LAB — SARS CORONAVIRUS 2 (TAT 6-24 HRS): SARS Coronavirus 2: POSITIVE — AB

## 2022-08-12 DIAGNOSIS — J449 Chronic obstructive pulmonary disease, unspecified: Secondary | ICD-10-CM | POA: Diagnosis not present

## 2022-08-12 DIAGNOSIS — M9901 Segmental and somatic dysfunction of cervical region: Secondary | ICD-10-CM | POA: Diagnosis not present

## 2022-08-12 DIAGNOSIS — Z79899 Other long term (current) drug therapy: Secondary | ICD-10-CM | POA: Diagnosis not present

## 2022-08-12 DIAGNOSIS — I10 Essential (primary) hypertension: Secondary | ICD-10-CM | POA: Diagnosis not present

## 2022-08-12 DIAGNOSIS — Z0000 Encounter for general adult medical examination without abnormal findings: Secondary | ICD-10-CM | POA: Diagnosis not present

## 2022-08-12 DIAGNOSIS — J431 Panlobular emphysema: Secondary | ICD-10-CM | POA: Diagnosis not present

## 2022-08-12 DIAGNOSIS — F32A Depression, unspecified: Secondary | ICD-10-CM | POA: Diagnosis not present

## 2022-08-12 DIAGNOSIS — F411 Generalized anxiety disorder: Secondary | ICD-10-CM | POA: Diagnosis not present

## 2022-08-12 DIAGNOSIS — M9905 Segmental and somatic dysfunction of pelvic region: Secondary | ICD-10-CM | POA: Diagnosis not present

## 2022-08-12 DIAGNOSIS — E039 Hypothyroidism, unspecified: Secondary | ICD-10-CM | POA: Diagnosis not present

## 2022-08-12 DIAGNOSIS — M9903 Segmental and somatic dysfunction of lumbar region: Secondary | ICD-10-CM | POA: Diagnosis not present

## 2022-08-12 DIAGNOSIS — G4733 Obstructive sleep apnea (adult) (pediatric): Secondary | ICD-10-CM | POA: Diagnosis not present

## 2022-08-12 DIAGNOSIS — E782 Mixed hyperlipidemia: Secondary | ICD-10-CM | POA: Diagnosis not present

## 2022-08-12 DIAGNOSIS — M9904 Segmental and somatic dysfunction of sacral region: Secondary | ICD-10-CM | POA: Diagnosis not present

## 2022-08-18 DIAGNOSIS — F3341 Major depressive disorder, recurrent, in partial remission: Secondary | ICD-10-CM | POA: Diagnosis not present

## 2022-08-19 DIAGNOSIS — J328 Other chronic sinusitis: Secondary | ICD-10-CM | POA: Diagnosis not present

## 2022-08-19 DIAGNOSIS — M9904 Segmental and somatic dysfunction of sacral region: Secondary | ICD-10-CM | POA: Diagnosis not present

## 2022-08-19 DIAGNOSIS — M9903 Segmental and somatic dysfunction of lumbar region: Secondary | ICD-10-CM | POA: Diagnosis not present

## 2022-08-19 DIAGNOSIS — B001 Herpesviral vesicular dermatitis: Secondary | ICD-10-CM | POA: Diagnosis not present

## 2022-08-19 DIAGNOSIS — J302 Other seasonal allergic rhinitis: Secondary | ICD-10-CM | POA: Diagnosis not present

## 2022-08-19 DIAGNOSIS — J019 Acute sinusitis, unspecified: Secondary | ICD-10-CM | POA: Diagnosis not present

## 2022-08-19 DIAGNOSIS — M9901 Segmental and somatic dysfunction of cervical region: Secondary | ICD-10-CM | POA: Diagnosis not present

## 2022-08-19 DIAGNOSIS — M9905 Segmental and somatic dysfunction of pelvic region: Secondary | ICD-10-CM | POA: Diagnosis not present

## 2022-08-20 DIAGNOSIS — J301 Allergic rhinitis due to pollen: Secondary | ICD-10-CM | POA: Diagnosis not present

## 2022-08-26 ENCOUNTER — Emergency Department
Admission: EM | Admit: 2022-08-26 | Discharge: 2022-08-26 | Disposition: A | Discharge status: Home / Self Care | Payer: Medicare HMO | Attending: Emergency Medicine | Admitting: Emergency Medicine

## 2022-08-26 ENCOUNTER — Other Ambulatory Visit: Payer: Self-pay

## 2022-08-26 ENCOUNTER — Emergency Department: Payer: Medicare HMO

## 2022-08-26 DIAGNOSIS — G8929 Other chronic pain: Secondary | ICD-10-CM | POA: Diagnosis not present

## 2022-08-26 DIAGNOSIS — R001 Bradycardia, unspecified: Secondary | ICD-10-CM | POA: Diagnosis not present

## 2022-08-26 DIAGNOSIS — R519 Headache, unspecified: Secondary | ICD-10-CM | POA: Insufficient documentation

## 2022-08-26 DIAGNOSIS — J449 Chronic obstructive pulmonary disease, unspecified: Secondary | ICD-10-CM | POA: Insufficient documentation

## 2022-08-26 DIAGNOSIS — R42 Dizziness and giddiness: Secondary | ICD-10-CM | POA: Diagnosis not present

## 2022-08-26 DIAGNOSIS — I10 Essential (primary) hypertension: Secondary | ICD-10-CM | POA: Diagnosis not present

## 2022-08-26 DIAGNOSIS — F4381 Prolonged grief disorder: Secondary | ICD-10-CM | POA: Diagnosis not present

## 2022-08-26 LAB — CBC
HCT: 43.2 % (ref 39.0–52.0)
Hemoglobin: 14.2 g/dL (ref 13.0–17.0)
MCH: 31.9 pg (ref 26.0–34.0)
MCHC: 32.9 g/dL (ref 30.0–36.0)
MCV: 97.1 fL (ref 80.0–100.0)
Platelets: 232 10*3/uL (ref 150–400)
RBC: 4.45 MIL/uL (ref 4.22–5.81)
RDW: 13.1 % (ref 11.5–15.5)
WBC: 7.7 10*3/uL (ref 4.0–10.5)
nRBC: 0 % (ref 0.0–0.2)

## 2022-08-26 LAB — BASIC METABOLIC PANEL
Anion gap: 11 (ref 5–15)
BUN: 11 mg/dL (ref 8–23)
CO2: 24 mmol/L (ref 22–32)
Calcium: 9.3 mg/dL (ref 8.9–10.3)
Chloride: 103 mmol/L (ref 98–111)
Creatinine, Ser: 0.8 mg/dL (ref 0.61–1.24)
GFR, Estimated: >60 mL/min (ref >60)
Glucose, Bld: 102 mg/dL — ABNORMAL HIGH (ref 70–99)
Potassium: 3.4 mmol/L — ABNORMAL LOW (ref 3.5–5.1)
Sodium: 138 mmol/L (ref 135–145)

## 2022-08-26 LAB — URINALYSIS, ROUTINE W REFLEX MICROSCOPIC
Bacteria, UA: NONE SEEN
Bilirubin Urine: NEGATIVE
Glucose, UA: NEGATIVE mg/dL
Hgb urine dipstick: NEGATIVE
Ketones, ur: NEGATIVE mg/dL
Leukocytes,Ua: NEGATIVE
Nitrite: NEGATIVE
Protein, ur: NEGATIVE mg/dL
Specific Gravity, Urine: 1.002 — ABNORMAL LOW (ref 1.005–1.030)
Squamous Epithelial / HPF: NONE SEEN /HPF (ref 0–5)
pH: 7 (ref 5.0–8.0)

## 2022-08-26 MED ORDER — ACETAMINOPHEN 500 MG PO TABS
1000.0000 mg | ORAL_TABLET | Freq: Once | ORAL | Status: AC
  Administered 2022-08-26: 1000 mg via ORAL
  Filled 2022-08-26: qty 2

## 2022-08-26 MED ORDER — METOCLOPRAMIDE HCL 10 MG PO TABS
10.0000 mg | ORAL_TABLET | Freq: Once | ORAL | Status: AC
  Administered 2022-08-26: 10 mg via ORAL
  Filled 2022-08-26: qty 1

## 2022-08-26 MED ORDER — DIPHENHYDRAMINE HCL 25 MG PO CAPS
25.0000 mg | ORAL_CAPSULE | Freq: Once | ORAL | Status: AC
  Administered 2022-08-26: 25 mg via ORAL
  Filled 2022-08-26: qty 1

## 2022-08-26 NOTE — ED Provider Notes (Signed)
[facility] Provider Note    Event Date/Time   First MD Initiated Contact with Patient 08/26/22 1753     (approximate)   History   Chief Complaint: Headache   HPI  Mitchell Powell is a 72 y.o. male with a past history of hypertension, GERD, anxiety, COPD who comes ED complaining of headache which has been going on for the past 3 months at least.  Gradual onset, episodic, waxing waning, no aggravating or alleviating factors.  No vomiting fever or neck pain or stiffness.  No chest pain or shortness of breath, no palpitations, no syncope.  No other neurologic symptoms.  Has an appointment with his PCP in 2 weeks.  No vision changes.     Physical Exam   Triage Vital Signs: ED Triage Vitals  Enc Vitals Group     BP 08/26/22 1732 (!) 180/86     Pulse Rate 08/26/22 1732 (!) 57     Resp 08/26/22 1732 18     Temp 08/26/22 1732 98 F (36.7 C)     Temp src --      SpO2 08/26/22 1732 94 %     Weight --      Height --      Head Circumference --      Peak Flow --      Pain Score 08/26/22 1731 6     Pain Loc --      Pain Edu? --      Excl. in GC? --     Most recent vital signs: Vitals:   08/26/22 1732  BP: (!) 180/86  Pulse: (!) 57  Resp: 18  Temp: 98 F (36.7 C)  SpO2: 94%    General: Awake, no distress.  CV:  Good peripheral perfusion.  Regular rate and rhythm Resp:  Normal effort.  Clear to auscultation bilaterally Abd:  No distention.  Soft nontender Other:  Cranial nerves II through XII intact.  No drift.  Normal gait.  Normal cerebellar function.  No nystagmus.   ED Results / Procedures / Treatments   Labs (all labs ordered are listed, but only abnormal results are displayed) Labs Reviewed  BASIC METABOLIC PANEL - Abnormal; Notable for the following components:      Result Value   Potassium 3.4 (*)    Glucose, Bld 102 (*)    All other components within normal limits  URINALYSIS, ROUTINE W REFLEX MICROSCOPIC - Abnormal; Notable for  the following components:   Color, Urine COLORLESS (*)    APPearance CLEAR (*)    Specific Gravity, Urine 1.002 (*)    All other components within normal limits  CBC  CBG MONITORING, ED     EKG Interpreted by me Sinus bradycardia rate of 57.  Normal axis, normal intervals.  Normal QRS ST segments and T waves.   RADIOLOGY CT head interpreted by me, negative for mass or intracranial hemorrhage.  Radiology report reviewed   PROCEDURES:  Procedures   MEDICATIONS ORDERED IN ED: Medications  acetaminophen (TYLENOL) tablet 1,000 mg (1,000 mg Oral Given 08/26/22 1852)  metoCLOPramide (REGLAN) tablet 10 mg (10 mg Oral Given 08/26/22 1852)  diphenhydrAMINE (BENADRYL) capsule 25 mg (25 mg Oral Given 08/26/22 1852)     IMPRESSION / MDM / ASSESSMENT AND PLAN / ED COURSE  I reviewed the triage vital signs and the nursing notes.  DDx: AKI, electrolyte abnormality, anemia, intracranial mass, cerebral hemorrhage  Patient's presentation is most consistent with acute presentation with potential threat  to life or bodily function.  Patient presents with vague episodic frontal headache which has been ongoing for the past 3 months at least.  No focal neurologic symptoms.  Exam is benign and reassuring.  Vital signs unremarkable except for elevated blood pressure.  He has an appointment with his primary care doctor in 2 weeks.  Labs and CT head were unremarkable.  Stable for discharge.       FINAL CLINICAL IMPRESSION(S) / ED DIAGNOSES   Final diagnoses:  Chronic nonintractable headache, unspecified headache type     Rx / DC Orders   ED Discharge Orders     None        Note:  This document was prepared using Dragon voice recognition software and may include unintentional dictation errors.   Sharman Cheek, MD 08/26/22 [phone]

## 2022-08-26 NOTE — ED Notes (Signed)
Pt in CT scan at this time. CT called and will take pt to ED 6 once exam is complete.

## 2022-08-26 NOTE — ED Triage Notes (Signed)
Pt comes with c/o headache for weeks. Pt states frontal headache.  Pt states dizziness as well.

## 2022-08-26 NOTE — Discharge Instructions (Addendum)
Your lab tests and CT scan of your head were okay today. Please follow up with your doctor at your appointment for further evaluation.

## 2022-09-01 ENCOUNTER — Emergency Department
Admission: EM | Admit: 2022-09-01 | Discharge: 2022-09-01 | Disposition: A | Discharge status: Other Acute Inpatient Hospital | Payer: Medicare HMO | Source: Home / Self Care | Attending: Emergency Medicine | Admitting: Emergency Medicine

## 2022-09-01 ENCOUNTER — Other Ambulatory Visit: Payer: Self-pay

## 2022-09-01 ENCOUNTER — Inpatient Hospital Stay
Admission: AD | Admit: 2022-09-01 | Discharge: 2022-09-07 | DRG: 882 | Disposition: A | Discharge status: Home / Self Care | Payer: Medicare HMO | Source: Intra-hospital | Attending: Psychiatry | Admitting: Psychiatry

## 2022-09-01 DIAGNOSIS — Z79890 Hormone replacement therapy (postmenopausal): Secondary | ICD-10-CM | POA: Diagnosis not present

## 2022-09-01 DIAGNOSIS — I10 Essential (primary) hypertension: Secondary | ICD-10-CM | POA: Diagnosis not present

## 2022-09-01 DIAGNOSIS — E039 Hypothyroidism, unspecified: Secondary | ICD-10-CM | POA: Diagnosis present

## 2022-09-01 DIAGNOSIS — K824 Cholesterolosis of gallbladder: Secondary | ICD-10-CM | POA: Diagnosis not present

## 2022-09-01 DIAGNOSIS — F32A Depression, unspecified: Secondary | ICD-10-CM | POA: Insufficient documentation

## 2022-09-01 DIAGNOSIS — F4323 Adjustment disorder with mixed anxiety and depressed mood: Principal | ICD-10-CM | POA: Diagnosis present

## 2022-09-01 DIAGNOSIS — K30 Functional dyspepsia: Secondary | ICD-10-CM | POA: Diagnosis not present

## 2022-09-01 DIAGNOSIS — R45851 Suicidal ideations: Secondary | ICD-10-CM | POA: Diagnosis not present

## 2022-09-01 DIAGNOSIS — J3489 Other specified disorders of nose and nasal sinuses: Secondary | ICD-10-CM | POA: Insufficient documentation

## 2022-09-01 DIAGNOSIS — J449 Chronic obstructive pulmonary disease, unspecified: Secondary | ICD-10-CM | POA: Diagnosis not present

## 2022-09-01 DIAGNOSIS — Z8249 Family history of ischemic heart disease and other diseases of the circulatory system: Secondary | ICD-10-CM

## 2022-09-01 DIAGNOSIS — R1013 Epigastric pain: Secondary | ICD-10-CM | POA: Diagnosis not present

## 2022-09-01 DIAGNOSIS — F322 Major depressive disorder, single episode, severe without psychotic features: Secondary | ICD-10-CM | POA: Diagnosis not present

## 2022-09-01 DIAGNOSIS — K2970 Gastritis, unspecified, without bleeding: Secondary | ICD-10-CM | POA: Diagnosis not present

## 2022-09-01 DIAGNOSIS — K828 Other specified diseases of gallbladder: Secondary | ICD-10-CM | POA: Diagnosis not present

## 2022-09-01 LAB — COMPREHENSIVE METABOLIC PANEL
ALT: 14 U/L (ref 0–44)
AST: 15 U/L (ref 15–41)
Albumin: 4.8 g/dL (ref 3.5–5.0)
Alkaline Phosphatase: 62 U/L (ref 38–126)
Anion gap: 9 (ref 5–15)
BUN: 15 mg/dL (ref 8–23)
CO2: 24 mmol/L (ref 22–32)
Calcium: 9.5 mg/dL (ref 8.9–10.3)
Chloride: 101 mmol/L (ref 98–111)
Creatinine, Ser: 0.83 mg/dL (ref 0.61–1.24)
GFR, Estimated: >60 mL/min (ref >60)
Glucose, Bld: 100 mg/dL — ABNORMAL HIGH (ref 70–99)
Potassium: 3.8 mmol/L (ref 3.5–5.1)
Sodium: 134 mmol/L — ABNORMAL LOW (ref 135–145)
Total Bilirubin: 0.5 mg/dL (ref 0.3–1.2)
Total Protein: 7.4 g/dL (ref 6.5–8.1)

## 2022-09-01 LAB — CBC
HCT: 44.7 % (ref 39.0–52.0)
Hemoglobin: 14.7 g/dL (ref 13.0–17.0)
MCH: 32 pg (ref 26.0–34.0)
MCHC: 32.9 g/dL (ref 30.0–36.0)
MCV: 97.4 fL (ref 80.0–100.0)
Platelets: 242 10*3/uL (ref 150–400)
RBC: 4.59 MIL/uL (ref 4.22–5.81)
RDW: 13.2 % (ref 11.5–15.5)
WBC: 6.8 10*3/uL (ref 4.0–10.5)
nRBC: 0 % (ref 0.0–0.2)

## 2022-09-01 LAB — URINE DRUG SCREEN, QUALITATIVE (ARMC ONLY)
Amphetamines, Ur Screen: NOT DETECTED
Barbiturates, Ur Screen: NOT DETECTED
Benzodiazepine, Ur Scrn: NOT DETECTED
Cannabinoid 50 Ng, Ur ~~LOC~~: NOT DETECTED
Cocaine Metabolite,Ur ~~LOC~~: NOT DETECTED
MDMA (Ecstasy)Ur Screen: NOT DETECTED
Methadone Scn, Ur: NOT DETECTED
Opiate, Ur Screen: NOT DETECTED
Phencyclidine (PCP) Ur S: NOT DETECTED
Tricyclic, Ur Screen: NOT DETECTED

## 2022-09-01 LAB — SALICYLATE LEVEL: Salicylate Lvl: <7 mg/dL — ABNORMAL LOW (ref 7.0–30.0)

## 2022-09-01 LAB — ACETAMINOPHEN LEVEL: Acetaminophen (Tylenol), Serum: 14 ug/mL (ref 10–30)

## 2022-09-01 LAB — ETHANOL: Alcohol, Ethyl (B): <10 mg/dL (ref <10)

## 2022-09-01 MED ORDER — FLUOXETINE HCL 20 MG PO CAPS: 30.0000 mg | ORAL_CAPSULE | Freq: Every day | ORAL | Status: DC

## 2022-09-01 MED ORDER — HYDROXYZINE HCL 25 MG PO TABS
25.0000 mg | ORAL_TABLET | Freq: Three times a day (TID) | ORAL | Status: DC | PRN
  Administered 2022-09-01 – 2022-09-06 (×8): 25 mg via ORAL
  Filled 2022-09-01 (×8): qty 1

## 2022-09-01 MED ORDER — MAGNESIUM HYDROXIDE 400 MG/5ML PO SUSP: 30.0000 mL | Freq: Every day | ORAL | Status: DC | PRN

## 2022-09-01 MED ORDER — FLUOXETINE HCL 20 MG PO CAPS
20.0000 mg | ORAL_CAPSULE | Freq: Every day | ORAL | Status: DC
  Administered 2022-09-02 – 2022-09-07 (×6): 20 mg via ORAL
  Filled 2022-09-01 (×6): qty 1

## 2022-09-01 MED ORDER — CLONAZEPAM 0.5 MG PO TABS
0.5000 mg | ORAL_TABLET | Freq: Once | ORAL | Status: AC
  Administered 2022-09-01: 0.5 mg via ORAL
  Filled 2022-09-01: qty 1

## 2022-09-01 MED ORDER — ALUM & MAG HYDROXIDE-SIMETH 200-200-20 MG/5ML PO SUSP
30.0000 mL | ORAL | Status: DC | PRN
  Administered 2022-09-02 – 2022-09-03 (×3): 30 mL via ORAL
  Filled 2022-09-01 (×3): qty 30

## 2022-09-01 MED ORDER — DIPHENHYDRAMINE HCL 50 MG/ML IJ SOLN: 50.0000 mg | Freq: Three times a day (TID) | INTRAMUSCULAR | Status: DC | PRN

## 2022-09-01 MED ORDER — DIPHENHYDRAMINE HCL 25 MG PO CAPS
50.0000 mg | ORAL_CAPSULE | Freq: Three times a day (TID) | ORAL | Status: DC | PRN
  Administered 2022-09-01: 50 mg via ORAL
  Filled 2022-09-01: qty 2

## 2022-09-01 MED ORDER — ACETAMINOPHEN 325 MG PO TABS
650.0000 mg | ORAL_TABLET | Freq: Once | ORAL | Status: AC
  Administered 2022-09-01: 650 mg via ORAL
  Filled 2022-09-01: qty 2

## 2022-09-01 MED ORDER — IPRATROPIUM BROMIDE 0.03 % NA SOLN
2.0000 | Freq: Two times a day (BID) | NASAL | Status: DC
  Administered 2022-09-01 – 2022-09-04 (×6): 2 via NASAL
  Filled 2022-09-01: qty 30

## 2022-09-01 MED ORDER — FLUOXETINE HCL 10 MG PO CAPS: 10.0000 mg | ORAL_CAPSULE | Freq: Every day | ORAL | Status: DC

## 2022-09-01 MED ORDER — FLUOXETINE HCL 20 MG PO CAPS: 20.0000 mg | ORAL_CAPSULE | Freq: Every day | ORAL | Status: DC

## 2022-09-01 MED ORDER — CLONAZEPAM 0.5 MG PO TABS: 0.5000 mg | ORAL_TABLET | Freq: Every day | ORAL | Status: DC | PRN

## 2022-09-01 MED ORDER — ACETAMINOPHEN 325 MG PO TABS
650.0000 mg | ORAL_TABLET | Freq: Four times a day (QID) | ORAL | Status: DC | PRN
  Administered 2022-09-01 – 2022-09-05 (×7): 650 mg via ORAL
  Filled 2022-09-01 (×8): qty 2

## 2022-09-01 NOTE — ED Provider Triage Note (Signed)
Emergency Medicine Provider Triage Evaluation Note  Mitchell Powell , a 72 y.o. male  was evaluated in triage.  Pt complains of suicidal ideations and feeling hopelesness. Multiple family members have passed away.  Physical Exam  BP (!) 141/85   Pulse 61   Temp 98.5 F (36.9 C)   Resp 18   Ht 6' (1.829 m)   Wt 85.7 kg   SpO2 100%   BMI 25.63 kg/m  Gen:   Awake, no distress   Resp:  Normal effort  MSK:   Moves extremities without difficulty  Other:    Medical Decision Making  Medically screening exam initiated at 1:02 PM.  Appropriate orders placed.  Mitchell Powell was informed that the remainder of the evaluation will be completed by another provider, this initial triage assessment does not replace that evaluation, and the importance of remaining in the ED until their evaluation is complete.     Chinita Pester, FNP 09/01/22 1304

## 2022-09-01 NOTE — BH Assessment (Signed)
Comprehensive Clinical Assessment (CCA) Note  09/01/2022 Mitchell Powell [id number]  Mitchell Powell, 72 year old male who presents to [facility] ED voluntarily for treatment. Per triage note, Pt to ED for SI. Reports losing multiple family members and feeling hopeless, lonely and like could harm self. Denies plan. Hx depression, anxiety. Daily drinker, last drink yesterday afternoon.   During TTS assessment pt presents alert and oriented x 4, restless but cooperative, and mood-congruent with affect. The pt does not appear to be responding to internal or external stimuli. Neither is the pt presenting with any delusional thinking. Pt verified the information provided to triage RN.   Pt identifies his main complaint to be that he "has so much on his mind." Patient reports feeling overwhelmed. Patient states he has been dealing with several health issues. He has had a cold for over 3 months, a "gigantic headache", an enlarged prostate which has him using the bathroom constantly, stomach problems and Covid last month. In addition, patient states his wife passed away 2 years ago and most recently, he lost his brother in January. Patient says he is lonely, can hardly sleep and does not have an appetite. Patient admits to drinking 2 beers daily for most of his life. Patient says he has an appointment to see a therapist September 21, 2022; however, he states he "can't take it anymore." Patient reports no INPT hx but is being followed by his PCP, Dr. Judithann Sheen at [facility]. Patient states he is compliant with his medication and takes it as prescribed. Patient reports he has been contemplating suicide for the past couple of weeks but does not have a plan. Pt denies current HI/AH/VH. Pt provided his daughter, Mitchell Powell as a collateral contact.    Writer spoke to patient's daughter Mitchell Powell [phone] who states she talks to her dad every day and ever since his hip surgery, patient has been "either down and out or irritated." She  confirmed that patient has experienced 2 significant deaths in the past couple of years which have played a key role in his current behavior. She says she has tried to get patient to "do more" but patient does not wish to talk about his feelings. [name] states she is not sure if inpatient will be beneficial but she is concerned about his overall wellbeing.    Writer collaborated with [name], NP and pt is recommended for inpatient psychiatric admission.    Chief Complaint:  Chief Complaint  Patient presents with   Anxiety   Depression    Patient reports he is overwhelmed with several health concerns and depressed due to the loss of his wife and brother.    Visit Diagnosis: Depression   CCA Screening, Triage and Referral (STR)  Patient Reported Information How did you hear about [place]? Self  Referral name: No data recorded Referral phone number: No data recorded  Whom do you see for routine medical problems? No data recorded Practice/Facility Name: No data recorded Practice/Facility Phone Number: No data recorded Name of Contact: No data recorded Contact Number: No data recorded Contact Fax Number: No data recorded Prescriber Name: No data recorded Prescriber Address (if known): No data recorded  What Is the Reason for Your Visit/Call Today? Patient states he is overwhelemed and contemplating suicide with no plan.  How Long Has This Been Causing You Problems? 1-6 months  What Do You Feel Would Help You the Most Today? Treatment for Depression or other mood problem; Stress Management; Medication(s)   Have You Recently  Been in Any Inpatient Treatment (Hospital/Detox/Crisis Center/28-Day Program)? No data recorded Name/Location of Program/Hospital:No data recorded How Long Were You There? No data recorded When Were You Discharged? No data recorded  Have You Ever Received Services From [facility] Before? No data recorded Who Do You See at [facility]? No data recorded  Have You  Recently Had Any Thoughts About Hurting Yourself? No  Are You Planning to Commit Suicide/Harm Yourself At This time? No   Have you Recently Had Thoughts About Hurting Someone [name]? No  Explanation: No data recorded  Have You Used Any Alcohol or Drugs in the Past 24 Hours? Yes  How Long Ago Did You Use Drugs or Alcohol? No data recorded What Did You Use and How Much? Alcohol- 2 beer per day   Do You Currently Have a Therapist/Psychiatrist? No  Name of Therapist/Psychiatrist: No data recorded  Have You Been Recently Discharged From Any Office Practice or Programs? No  Explanation of Discharge From Practice/Program: No data recorded    CCA Screening Triage Referral Assessment Type of Contact: Face-to-Face  Is this Initial or Reassessment? No data recorded Date Telepsych consult ordered in [facility]:  No data recorded Time Telepsych consult ordered in [facility]:  No data recorded  Patient Reported Information Reviewed? No data recorded Patient Left Without Being Seen? No data recorded Reason for Not Completing Assessment: No data recorded  Collateral Involvement: [name]- [phone]   Does Patient Have a Automotive engineer Guardian? No data recorded Name and Contact of Legal Guardian: No data recorded If Minor and Not Living with Parent(s), Who has Custody? No data recorded Is CPS involved or ever been involved? Never  Is APS involved or ever been involved? Never   Patient Determined To Be At Risk for Harm To Self or Others Based on Review of Patient Reported Information or Presenting Complaint? Yes, for Self-Harm  Method: No Plan  Availability of Means: No access or NA  Intent: Vague intent or NA  Notification Required: No need or identified person  Additional Information for Danger to Others Potential: No data recorded Additional Comments for Danger to Others Potential: No data recorded Are There Guns or Other Weapons in Your Home? No data recorded Types of  Guns/Weapons: No data recorded Are These Weapons Safely Secured?                            No data recorded Who Could Verify You Are Able To Have These Secured: No data recorded Do You Have any Outstanding Charges, Pending Court Dates, Parole/Probation? No data recorded Contacted To Inform of Risk of Harm To Self or Others: No data recorded  Location of Assessment: [facility] ED   Does Patient Present under Involuntary Commitment? No  IVC Papers Initial File Date: No data recorded  [place] of Residence: [place]   Patient Currently Receiving the Following Services: Medication Management   Determination of Need: Emergent (2 hours)   Options For Referral: ED Visit; Inpatient Hospitalization; Medication Management; Outpatient Therapy; Geropsychiatric Facility     CCA Biopsychosocial Intake/Chief Complaint:  No data recorded Current Symptoms/Problems: No data recorded  Patient Reported Schizophrenia/Schizoaffective Diagnosis in Past: No   Strengths: Patient able to verbalize needs.  Preferences: No data recorded Abilities: No data recorded  Type of Services Patient Feels are Needed: No data recorded  Initial Clinical Notes/Concerns: No data recorded  Mental Health Symptoms Depression:   Difficulty Concentrating; Increase/decrease in appetite; Change in  energy/activity; Irritability; Sleep (too much or little)   Duration of Depressive symptoms:  Greater than two weeks   Mania:   N/A   Anxiety:    Difficulty concentrating; Irritability   Psychosis:   None   Duration of Psychotic symptoms: No data recorded  Trauma:   N/A   Obsessions:   N/A   Compulsions:   N/A   Inattention:   N/A   Hyperactivity/Impulsivity:   N/A   Oppositional/Defiant Behaviors:   N/A   Emotional Irregularity:   Chronic feelings of emptiness   Other Mood/Personality Symptoms:  No data recorded   Mental Status Exam Appearance and self-care  Stature:   Average   Weight:    Average weight   Clothing:   Casual   Grooming:   Normal   Cosmetic use:   None   Posture/gait:   Slumped   Motor activity:   Not Remarkable   Sensorium  Attention:   Normal   Concentration:   Anxiety interferes   Orientation:   X5   Recall/memory:   Normal   Affect and Mood  Affect:   Anxious; Depressed; Flat   Mood:   Anxious; Depressed; Hopeless   Relating  Eye contact:   Normal   Facial expression:   Depressed; Anxious   Attitude toward examiner:   Cooperative   Thought and Language  Speech flow:  Clear and Coherent   Thought content:   Appropriate to Mood and Circumstances   Preoccupation:   None   Hallucinations:   None   Organization:  No data recorded  [employer] of Knowledge:   Average   Intelligence:   Average   Abstraction:   Normal   Judgement:   Impaired   Reality Testing:   Realistic   Insight:   Fair   Decision Making:   Normal   Social Functioning  Social Maturity:   Isolates   Social Judgement:   Victimized   Stress  Stressors:   Grief/losses; Illness   Coping Ability:   Overwhelmed   Skill Deficits:   Interpersonal   Supports:   Family     Religion:    Leisure/Recreation:    Exercise/Diet: Exercise/Diet Do You Have Any Trouble Sleeping?: Yes Explanation of Sleeping Difficulties: Patient states he can hardly sleep.   CCA Employment/Education Employment/Work Situation:    Education:     CCA Family/Childhood History Family and Relationship History: Family history Marital status: Widowed Does patient have children?: Yes How many children?: 2 How is patient's relationship with their children?: [place], talks to both his adult daughters daily.  Childhood History:     Child/Adolescent Assessment:     CCA Substance Use Alcohol/Drug Use: Alcohol / Drug Use Pain Medications: See PTA Prescriptions: See PTA Over the Counter: See PTA History of alcohol  / drug use?: Yes Longest period of sobriety (when/how long): Unknown Substance #1 Name of Substance 1: Alcohol 1 - Amount (size/oz): 2 beers 1 - Frequency: daily 1 - Last Use / Amount: 08/31/22 1- Route of Use: oral                       ASAM's:  Six Dimensions of Multidimensional Assessment  Dimension 1:  Acute Intoxication and/or Withdrawal Potential:      Dimension 2:  Biomedical Conditions and Complications:      Dimension 3:  Emotional, Behavioral, or Cognitive Conditions and Complications:     Dimension 4:  Readiness to Change:  Dimension 5:  Relapse, Continued use, or Continued Problem Potential:     Dimension 6:  Recovery/Living Environment:     ASAM Severity Score:    ASAM Recommended Level of Treatment:     Substance use Disorder (SUD)    Recommendations for Services/Supports/Treatments: Recommendations for Services/Supports/Treatments Recommendations For Services/Supports/Treatments: Individual Therapy, Inpatient Hospitalization, Medication Management  DSM5 Diagnoses: Patient Active Problem List   Diagnosis Date Noted   Low back pain 04/16/2022   Rash 07/08/2021   Bradycardia 11/30/2018   Knee pain 11/22/2018   Acquired hypothyroidism 07/27/2018   Personal history of colonic polyps    Polyp of sigmoid colon    Chest pain with high risk for cardiac etiology 11/12/2017   GERD (gastroesophageal reflux disease) 10/27/2017   Hyperlipidemia 10/27/2017   Hypertension 10/27/2016   AAA (abdominal aortic aneurysm) without rupture (HCC) 10/27/2016   Popliteal artery aneurysm (HCC) 10/27/2016   Femoral artery aneurysm, bilateral (HCC) 10/27/2016   OSA on CPAP 01/21/2016   SOB (shortness of breath) on exertion 10/31/2014   Anxiety disorder 10/25/2013   COPD (chronic obstructive pulmonary disease) (HCC) 10/25/2013   Depression 10/25/2013    Patient Centered Plan: Patient is on the following Treatment Plan(s):  Anxiety and Depression   Referrals to  Alternative Service(s): Referred to Alternative Service(s):   Place:   Date:   Time:    Referred to Alternative Service(s):   Place:   Date:   Time:    Referred to Alternative Service(s):   Place:   Date:   Time:    Referred to Alternative Service(s):   Place:   Date:   Time:      @BHCOLLABOFCARE @   R Theatre manager, Counselor, LCAS-A

## 2022-09-01 NOTE — BH Assessment (Signed)
Writer contacted patient's daughter, [name] and informed her that patient was accepted to inpatient unit.

## 2022-09-01 NOTE — ED Notes (Signed)
Pt complaining of headache, VO tylenol 650mg  by dr Rosalia Hammers

## 2022-09-01 NOTE — ED Provider Notes (Signed)
[facility] Provider Note    Event Date/Time   First MD Initiated Contact with Patient 09/01/22 1319     (approximate)   History   Depression   HPI  Mitchell Powell is a 72 y.o. male who presents to the emergency department today because of concerns for depression.  He says he has battled with depression for about 20 years however over the past 2 years things been worse since the death of his wife.  He has also had multiple other stressors recently.  Has been trying to get outpatient therapy although says he has been unsuccessful over the past month.  Does have occasional thoughts of wanting to harm himself but states that he does have daughters and grandchildren that he wants to live for.  He endorses hopelessness, decreased appetite and decreased sleep.     Physical Exam   Triage Vital Signs: ED Triage Vitals  Enc Vitals Group     BP 09/01/22 1259 (!) 141/85     Pulse Rate 09/01/22 1256 61     Resp 09/01/22 1256 18     Temp 09/01/22 1256 98.5 F (36.9 C)     Temp src --      SpO2 09/01/22 1256 100 %     Weight 09/01/22 1258 189 lb (85.7 kg)     Height 09/01/22 1258 6' (1.829 m)     Head Circumference --      Peak Flow --      Pain Score 09/01/22 1258 0     Pain Loc --      Pain Edu? --      Excl. in GC? --     Most recent vital signs: Vitals:   09/01/22 1256 09/01/22 1259  BP:  (!) 141/85  Pulse: 61   Resp: 18   Temp: 98.5 F (36.9 C)   SpO2: 100%    General: Awake, alert, oriented. CV:  Good peripheral perfusion. Regular rate and rhythm. Resp:  Normal effort. Lungs clear. Abd:  No distention.    ED Results / Procedures / Treatments   Labs (all labs ordered are listed, but only abnormal results are displayed) Labs Reviewed  COMPREHENSIVE METABOLIC PANEL - Abnormal; Notable for the following components:      Result Value   Sodium 134 (*)    Glucose, Bld 100 (*)    All other components within normal limits  SALICYLATE LEVEL -  Abnormal; Notable for the following components:   Salicylate Lvl <7.0 (*)    All other components within normal limits  ACETAMINOPHEN LEVEL  CBC  URINE DRUG SCREEN, QUALITATIVE ([facility] ONLY)  ETHANOL     EKG  None   RADIOLOGY None   PROCEDURES:  Critical Care performed: No    MEDICATIONS ORDERED IN ED: Medications - No data to display   IMPRESSION / MDM / ASSESSMENT AND PLAN / ED COURSE  I reviewed the triage vital signs and the nursing notes.                              Differential diagnosis includes, but is not limited to, depression, SI  Patient's presentation is most consistent with acute presentation with potential threat to life or bodily function.  Patient presented to the emergency department today because of concerns for depression.  On exam patient does appear depressed.  Denies any active SI.  Will have psychiatry evaluate.  The patient has  been placed in psychiatric observation due to the need to provide a safe environment for the patient while obtaining psychiatric consultation and evaluation, as well as ongoing medical and medication management to treat the patient's condition.  The patient has not been placed under full IVC at this time.    FINAL CLINICAL IMPRESSION(S) / ED DIAGNOSES   Final diagnoses:  Depression, unspecified depression type      Note:  This document was prepared using Dragon voice recognition software and may include unintentional dictation errors.    Phineas Semen, MD 09/01/22 1520

## 2022-09-01 NOTE — Consult Note (Signed)
Telepsych Consultation   Reason for Consult:  Suicidal Ideation Referring Physician:  Phineas Semen, MD  Location of Patient:    [facility] Location of Provider: Other: virtual home office  Patient Identification: SRIMAN ADELMANN MRN:  [id number] Principal Diagnosis: Adjustment disorder with mixed anxiety and depressed mood Diagnosis:  Principal Problem:   Adjustment disorder with mixed anxiety and depressed mood Active Problems:   Depression   Total Time spent with patient: 30 minutes  Subjective:   Mitchell Powell is a 72 y.o. male patient admitted with  Per RN Triage Note dated 09/01/2022:   HPI:   Patient seen via telepsych by this provider; chart reviewed and consulted with Dr. Renato Shin on 09/01/22.  On evaluation Mitchell Powell is seen sitting in an exam room on a chair. He is fairly groomed and wearing hospital scrubs.  Pt is greeted by this Clinical research associate and anticipatory guidance given.  Pt makes fair amount of eye contact.  He appears nervous and uncomfortable, seen frequently moving around in his chair when directly asked about suicidal ideation.  Pt endorses several personal loses within a short time frame, and mounting psychosocial stressor.  He states she has an appt for therapy later this month but felt he needed to seek care today.  Pt endorses suicidal ideation over the past few weeks; owns firearms and tells me he's thought about using them to end his life.      Pt reports a hx for depression/anxiety and is currently takes prozac 30mg  po daily.  States he's been on prozac for 20 years but dose was recently increased.  Also takes clonazepam 0.5mg  po daily prn anxiety.  PDMB reviewed, clonazepam last dispensed June 08/2022 for 90 qty. He denies taking wellbtutrin although it is listed on his medication record.   He denies hx of prior suicide attempt or psychiatric hospitalization.  He denies alcohol use or illicit drug usage.   He lives alone and reports no social support.  Has 2 adult  daughters who both live in [place].  He mostly communicates with them via telephone but states it's not the same as seeing them.    Labs:  CMP: glucose is elevated at 100mg /dl but non-fasting;  CBC: no leukocytosis UDS is negative  EKG completed 08/26/2022: QT/QTC intervals were 420/408 Pt already medically cleared at the time of psychiatry assessment.  No medical contribution to systems seen.    During evaluation [name] is seated on chair; He is alert/oriented x 4; he appears, depressed, sad, and has feeling of worthlessness but cooperative; and mood congruent with affect.  Patient is speaking in a clear tone at moderate volume, and normal pace; with fair eye contact.  His thought process is coherent; There is no indication that he is currently responding to internal/external stimuli or experiencing delusional thought content.  Patient  endorses suicidal with plan to end his life using a firearm that he owns. He denies homicidal ideation, psychosis, and paranoia.  Patient has remained calm throughout assessment and has answered questions appropriately.     Per ED Provider Admission Assessment 09/01/2022: History    Depression     HPI   TREYLAN Powell is a 72 y.o. male who presents to the emergency department today because of concerns for depression.  He says he has battled with depression for about 20 years however over the past 2 years things been worse since the death of his wife.  He has also had multiple other stressors  recently.  Has been trying to get outpatient therapy although says he has been unsuccessful over the past month.  Does have occasional thoughts of wanting to harm himself but states that he does have daughters and grandchildren that he wants to live for.  He endorses hopelessness, decreased appetite and decreased sleep.   Past Psychiatric History: Anxiety, depression  Risk to Self:  yes Risk to Others:  denies Prior Inpatient Therapy:  denies Prior  Outpatient Therapy:  denies  Past Medical History:  Past Medical History:  Diagnosis Date   Anxiety    Depression    GERD (gastroesophageal reflux disease)    Hyperlipidemia    Hypertension    Hypothyroidism     Past Surgical History:  Procedure Laterality Date   ABDOMINAL AORTIC ANEURYSM REPAIR     COLONOSCOPY WITH PROPOFOL N/A 05/10/2018   Procedure: COLONOSCOPY WITH PROPOFOL;  Surgeon: Midge Minium, MD;  Location: [facility] ENDOSCOPY;  Service: Endoscopy;  Laterality: N/A;   JOINT REPLACEMENT     TONSILLECTOMY AND ADENOIDECTOMY     Family History:  Family History  Problem Relation Age of Onset   Varicose Veins Father    Heart attack Brother    Diabetes Brother    Family Psychiatric  History: deferred Social History:  Social History   Substance and Sexual Activity  Alcohol Use Yes     Social History   Substance and Sexual Activity  Drug Use No    Social History   Socioeconomic History   Marital status: Widowed    Spouse name: Not on file   Number of children: Not on file   Years of education: Not on file   Highest education level: Not on file  Occupational History   Not on file  Tobacco Use   Smoking status: Never    Passive exposure: Never   Smokeless tobacco: Never  Substance and Sexual Activity   Alcohol use: Yes   Drug use: No   Sexual activity: Not on file  Other Topics Concern   Not on file  Social History Narrative   Not on file   Social Determinants of Health   Financial Resource Strain: Not on file  Food Insecurity: Not on file  Transportation Needs: Not on file  Physical Activity: Not on file  Stress: Not on file  Social Connections: Not on file   Additional Social History:    Allergies:   Allergies  Allergen Reactions   Prednisone     Other reaction(s): Other (See Comments) irritiability    Labs:  Results for orders placed or performed during the hospital encounter of 09/01/22 (from the past 48 hour(s))  Urine Drug Screen,  Qualitative     Status: None   Collection Time: 09/01/22  1:00 PM  Result Value Ref Range   Tricyclic, Ur Screen NONE DETECTED NONE DETECTED   Amphetamines, Ur Screen NONE DETECTED NONE DETECTED   MDMA (Ecstasy)Ur Screen NONE DETECTED NONE DETECTED   Cocaine Metabolite,Ur [place] NONE DETECTED NONE DETECTED   Opiate, Ur Screen NONE DETECTED NONE DETECTED   Phencyclidine (PCP) Ur S NONE DETECTED NONE DETECTED   Cannabinoid 50 Ng, Ur [place] NONE DETECTED NONE DETECTED   Barbiturates, Ur Screen NONE DETECTED NONE DETECTED   Benzodiazepine, Ur Scrn NONE DETECTED NONE DETECTED   Methadone Scn, Ur NONE DETECTED NONE DETECTED    Comment: (NOTE) Tricyclics + metabolites, urine    Cutoff 1000 ng/mL Amphetamines + metabolites, urine  Cutoff 1000 ng/mL MDMA (Ecstasy), urine  Cutoff 500 ng/mL Cocaine Metabolite, urine          Cutoff 300 ng/mL Opiate + metabolites, urine        Cutoff 300 ng/mL Phencyclidine (PCP), urine         Cutoff 25 ng/mL Cannabinoid, urine                 Cutoff 50 ng/mL Barbiturates + metabolites, urine  Cutoff 200 ng/mL Benzodiazepine, urine              Cutoff 200 ng/mL Methadone, urine                   Cutoff 300 ng/mL  The urine drug screen provides only a preliminary, unconfirmed analytical test result and should not be used for non-medical purposes. Clinical consideration and professional judgment should be applied to any positive drug screen result due to possible interfering substances. A more specific alternate chemical method must be used in order to obtain a confirmed analytical result. Gas chromatography / mass spectrometry (GC/MS) is the preferred confirm atory method. Performed at [facility], [address] [place], [place] [address]   Comprehensive metabolic panel     Status: Abnormal   Collection Time: 09/01/22  1:02 PM  Result Value Ref Range   Sodium 134 (L) 135 - 145 mmol/L   Potassium 3.8 3.5 - 5.1 mmol/L   Chloride 101 98 -  111 mmol/L   CO2 24 22 - 32 mmol/L   Glucose, Bld 100 (H) 70 - 99 mg/dL    Comment: Glucose reference range applies only to samples taken after fasting for at least 8 hours.   BUN 15 8 - 23 mg/dL   Creatinine, Ser 6.04 0.61 - 1.24 mg/dL   Calcium 9.5 8.9 - 54.0 mg/dL   Total Protein 7.4 6.5 - 8.1 g/dL   Albumin 4.8 3.5 - 5.0 g/dL   AST 15 15 - 41 U/L   ALT 14 0 - 44 U/L   Alkaline Phosphatase 62 38 - 126 U/L   Total Bilirubin 0.5 0.3 - 1.2 mg/dL   GFR, Estimated >98 >11 mL/min    Comment: (NOTE) Calculated using the CKD-EPI Creatinine Equation (2021)    Anion gap 9 5 - 15    Comment: Performed at [facility], [address], [place], [place] [address]  Salicylate level     Status: Abnormal   Collection Time: 09/01/22  1:02 PM  Result Value Ref Range   Salicylate Lvl <7.0 (L) 7.0 - 30.0 mg/dL    Comment: Performed at [facility], [address] [place], [place] [address]  Acetaminophen level     Status: None   Collection Time: 09/01/22  1:02 PM  Result Value Ref Range   Acetaminophen (Tylenol), Serum 14 10 - 30 ug/mL    Comment: (NOTE) Therapeutic concentrations vary significantly. A range of 10-30 ug/mL  may be an effective concentration for many patients. However, some  are best treated at concentrations outside of this range. Acetaminophen concentrations >150 ug/mL at 4 hours after ingestion  and >50 ug/mL at 12 hours after ingestion are often associated with  toxic reactions.  Performed at [facility], [address] [place], [place] [address]   cbc     Status: None   Collection Time: 09/01/22  1:02 PM  Result Value Ref Range   WBC 6.8 4.0 - 10.5 K/uL   RBC 4.59 4.22 - 5.81 MIL/uL   Hemoglobin 14.7 13.0 -  17.0 g/dL   HCT 16.1 09.6 - 04.5 %   MCV 97.4 80.0 - 100.0 fL   MCH 32.0 26.0 - 34.0 pg   MCHC 32.9 30.0 - 36.0 g/dL   RDW 40.9 81.1 - 91.4 %   Platelets 242 150 - 400 K/uL   nRBC 0.0 0.0 - 0.2 %    Comment: Performed at  [facility], [address] [place], [place] [address]  Ethanol     Status: None   Collection Time: 09/01/22  1:02 PM  Result Value Ref Range   Alcohol, Ethyl (B) <10 <10 mg/dL    Comment: (NOTE) Lowest detectable limit for serum alcohol is 10 mg/dL.  For medical purposes only. Performed at [facility], [address] [place], [place] [address]     Medications:  No current facility-administered medications for this encounter.   Current Outpatient Medications  Medication Sig Dispense Refill   buPROPion (WELLBUTRIN XL) 150 MG 24 hr tablet Take 150 mg by mouth daily.     clonazePAM (KLONOPIN) 0.5 MG tablet Take by mouth daily as needed.     finasteride (PROSCAR) 5 MG tablet TAKE 1 TABLET EVERY DAY 90 tablet 3   FLUoxetine (PROZAC) 10 MG capsule Take 10 mg by mouth daily.     FLUoxetine (PROZAC) 20 MG capsule Take by mouth.     fluticasone (FLONASE) 50 MCG/ACT nasal spray Place into the nose.     ipratropium (ATROVENT) 0.03 % nasal spray Place 2 sprays into both nostrils 2 (two) times daily.     levothyroxine (SYNTHROID, LEVOTHROID) 50 MCG tablet Take 50 mcg by mouth daily before breakfast.     loratadine (CLARITIN) 10 MG tablet Take by mouth.     LORazepam (ATIVAN) 0.5 MG tablet Take 0.5 mg by mouth daily.     losartan (COZAAR) 50 MG tablet Take 50 mg by mouth daily.     montelukast (SINGULAIR) 10 MG tablet Take 10 mg by mouth at bedtime.     Omega-3 Fatty Acids (FISH OIL) 1000 MG CPDR Take by mouth.     pantoprazole (PROTONIX) 40 MG tablet Take 40 mg by mouth 2 (two) times daily.     pravastatin (PRAVACHOL) 40 MG tablet Take by mouth.     tadalafil (CIALIS) 5 MG tablet Take 1 tablet (5 mg total) by mouth daily as needed for erectile dysfunction. 90 tablet 3   benazepril (LOTENSIN) 20 MG tablet Take by mouth. (Patient not taking: Reported on 09/01/2022)     ciprofloxacin (CIPRO) 500 MG tablet Take 500 mg by mouth 2 (two) times daily. (Patient not taking:  Reported on 09/01/2022)     esomeprazole (NEXIUM) 40 MG capsule Take 1 capsule (40 mg total) by mouth daily. (Patient not taking: Reported on 09/01/2022) 30 capsule 5   FLUoxetine (PROZAC) 40 MG capsule Take 40 mg by mouth daily. (Patient not taking: Reported on 09/01/2022)     HYDROcodone-acetaminophen (NORCO/VICODIN) 5-325 MG tablet Take 1 tablet by mouth every 4 (four) hours as needed. (Patient not taking: Reported on 09/01/2022)     methocarbamol (ROBAXIN) 500 MG tablet Take 500 mg by mouth every 8 (eight) hours as needed. (Patient not taking: Reported on 09/01/2022)      Musculoskeletal: pt ambulates independently and moves all extremities Strength & Muscle Tone: within normal limits Gait & Station: normal Patient leans: N/A      Psychiatric Specialty Exam:  Presentation  General Appearance: Appropriate for Environment; Casual  Eye Contact:Fair  Speech:Clear  and Coherent  Speech Volume:Normal  Handedness:Right   Mood and Affect  Mood:Dysphoric; Depressed; Hopeless  Affect:Congruent; Depressed   Thought Process  Thought Processes:Goal Directed  Descriptions of Associations:Intact  Orientation:Full (Time, Place and Person)  Thought Content:Illogical (in that he has thoughts of ending his life)  History of Schizophrenia/Schizoaffective disorder:No  Duration of Psychotic Symptoms:No data recorded Hallucinations:Hallucinations: None  Ideas of Reference:None  Suicidal Thoughts:Suicidal Thoughts: Yes, Active SI Active Intent and/or Plan: -- (pt is guarded, denies plan but states he has thought about using his firearm)  Homicidal Thoughts:Homicidal Thoughts: No   Sensorium  Memory:Immediate Good; Recent Good; Remote Good  Judgment:Good  Insight:Fair   Executive Functions  Concentration:Fair  Attention Span:Fair  Recall:Good  Fund of Knowledge:Good  Language:Good   Psychomotor Activity  Psychomotor Activity:Psychomotor Activity: Normal   Assets   Assets:Communication Skills; Desire for Improvement; Financial Resources/Insurance; Housing   Sleep  Sleep:Sleep: Fair Number of Hours of Sleep: 6    Physical Exam: Physical Exam Vitals and nursing note reviewed.  Constitutional:      Appearance: Normal appearance.  Cardiovascular:     Rate and Rhythm: Normal rate.     Pulses: Normal pulses.  Pulmonary:     Effort: Pulmonary effort is normal.  Musculoskeletal:        General: Normal range of motion.  Neurological:     Mental Status: He is alert and oriented to person, place, and time. Mental status is at baseline.  Psychiatric:        Attention and Perception: Attention and perception normal.        Mood and Affect: Mood is anxious and depressed. Affect is blunt.        Speech: Speech normal.        Behavior: Behavior normal. Behavior is cooperative.        Thought Content: Thought content is not paranoid. Thought content includes suicidal ideation. Thought content does not include homicidal ideation. Thought content includes suicidal plan. Thought content does not include homicidal plan.        Cognition and Memory: Cognition normal.        Judgment: Judgment normal.    Review of Systems  Constitutional: Negative.   HENT: Negative.    Eyes: Negative.   Respiratory: Negative.    Cardiovascular: Negative.   Gastrointestinal: Negative.   Genitourinary: Negative.   Musculoskeletal: Negative.   Skin: Negative.   Neurological: Negative.   Endo/Heme/Allergies: Negative.   Psychiatric/Behavioral:  Positive for depression and suicidal ideas. Negative for hallucinations and substance abuse. The patient is nervous/anxious.    Blood pressure (!) 141/85, pulse 61, temperature 98.5 F (36.9 C), resp. rate 18, height 6' (1.829 m), weight 85.7 kg, SpO2 100 %. Body mass index is 25.63 kg/m.  Treatment Plan Summary: Pt with hx for depression/anxiety presents with suicidal ideation and plan for self harm involving firearm.  Pt is  triggered by personal losses, medical concerns and mounting psychosocial stressors.  He has no local social support and limited protective factors against self harm.   Pt is alert and oriented x4; presents with feeling of sadness, hopelessness and despair and cannot contract for safety.  He is a 72 year old white male, and based on above is considered a high risk for self harm.  Will refer for inpatient psych admission where his antidepressants can be adjusted and he can be monitored for safety and mood stability.  Did discuss going forward plan to have his firearms safeguarded out of his home  for safety concerns--pt agrees.    Daily contact with patient to assess and evaluate symptoms and progress in treatment and Medication management    He will continue the following Fluoxetine 30mg  po daily for depression/anxiety Given he reports fluoxetine was recently increased, no changes made today.  Continue clonazepam 0.5mg  po daily prn for anxiety  Disposition: Recommend psychiatric Inpatient admission when medically cleared.   This service was provided via telemedicine using a 2-way, interactive audio and video technology.  Dr. Trinna Post, ED Provider; Sharyn Creamer, RN and Gilford Rile, SW were al informed of above recommendation and disposition via secure chat.    Names of all persons participating in this telemedicine service and their role in this encounter. Name: Leibish Paynter. Pless Role: Patient  Name: Ophelia Shoulder Role: PMHNP  Name: Lewanda Rife Role: Psychiatrist   Chales Abrahams, NP 09/01/2022 7:01 PM

## 2022-09-01 NOTE — ED Notes (Addendum)
Pt dressed out with this RN and Danielle NT. Belongings include: Yellow shirt Black shoes [employer] Cell phone Keys  Nose spray [employer] yellow necklaces Black belt Black socks Gray underwear

## 2022-09-01 NOTE — ED Notes (Signed)
Pt allowed to collect phone number for friend from personal cell phone and return to belongings. Pt has paper on person

## 2022-09-01 NOTE — ED Triage Notes (Signed)
Pt to ED for SI. Reports losing multiple family members and feeling, hopeless, lonely and like could harm self.  Denies plan.  Hx depression, anxiety  Daily drinker, last drink yesterday afternoon.

## 2022-09-01 NOTE — ED Notes (Signed)
This NT provided pt with pm snack. 

## 2022-09-01 NOTE — ED Notes (Signed)
In interview room with ED staff completing psych assessment via tele-cart.

## 2022-09-01 NOTE — BH Assessment (Signed)
Patient is to be admitted to [facility] Psych Unit on tonight after 8:30pm by Dr.  Marlou Porch .  Attending Physician will be Dr. Marlou Porch.   Patient has been assigned to room L26, by [facility] Charge Nurse Whitney.    ER staff is aware of the admission: Misty Stanley, ER Secretary   Dr. Rosalia Hammers, ER MD  Connye Burkitt, Patient's Nurse

## 2022-09-02 ENCOUNTER — Encounter: Payer: Self-pay | Admitting: Adult Health

## 2022-09-02 MED ORDER — PANTOPRAZOLE SODIUM 40 MG PO TBEC
40.0000 mg | DELAYED_RELEASE_TABLET | ORAL | Status: AC
  Administered 2022-09-02: 40 mg via ORAL
  Filled 2022-09-02: qty 1

## 2022-09-02 MED ORDER — ADULT MULTIVITAMIN W/MINERALS CH
1.0000 | ORAL_TABLET | Freq: Every day | ORAL | Status: DC
  Administered 2022-09-02 – 2022-09-07 (×6): 1 via ORAL
  Filled 2022-09-02 (×6): qty 1

## 2022-09-02 MED ORDER — LORATADINE 10 MG PO TABS
10.0000 mg | ORAL_TABLET | Freq: Every day | ORAL | Status: DC
  Administered 2022-09-02 – 2022-09-07 (×6): 10 mg via ORAL
  Filled 2022-09-02 (×6): qty 1

## 2022-09-02 MED ORDER — ENSURE ENLIVE PO LIQD
237.0000 mL | Freq: Two times a day (BID) | ORAL | Status: DC
  Administered 2022-09-02 – 2022-09-04 (×3): 237 mL via ORAL

## 2022-09-02 MED ORDER — PANTOPRAZOLE SODIUM 40 MG PO TBEC
40.0000 mg | DELAYED_RELEASE_TABLET | Freq: Two times a day (BID) | ORAL | Status: DC
  Administered 2022-09-02 – 2022-09-04 (×5): 40 mg via ORAL
  Filled 2022-09-02 (×5): qty 1

## 2022-09-02 NOTE — H&P (Signed)
Psychiatric Admission Assessment Adult  Patient Identification: Mitchell Powell MRN:  [id number] Date of Evaluation:  09/02/2022 Chief Complaint:  Adjustment disorder with mixed anxiety and depressed mood [F43.23] Principal Diagnosis: Adjustment disorder with mixed anxiety and depressed mood Diagnosis:  Principal Problem:   Adjustment disorder with mixed anxiety and depressed mood  History of Present Illness: Patient is a 72 year old male who presented to emergency department because of depression and suicidal thoughts.  Today patient was seen on the unit during rounds.  Patient endorsed depressed mood, anhedonia, poor sleep, and poor appetite.  At times he feels helpless and hopeless.  Patient said that 2 years ago his wife of 24 years died of medical complications.  He also shared that his brother died in Mar 21, 2022.  Patient is grieving the losses.  Patient was provided with support and reassurance.  Patient reports suicidal thoughts, denies any intention to harm himself on the unit.  Patient denies past history of suicide attempt or inpatient psychiatric treatment.  Patient denies manic or psychotic symptoms.  We discussed coping strategies and individual psychotherapy upon discharge.  Patient was encouraged to attend group and work on coping strategies.  Patient denies homicidal ideations.  Patient denies any intention or plan to harm himself on the unit  Total Time spent with patient: 1 hour  Past Psychiatric History: Patient reports history of depression  Is the patient at risk to self? Yes.    Has the patient been a risk to self in the past 6 months? Yes.    Has the patient been a risk to self within the distant past? Yes.    Is the patient a risk to others? No.  Has the patient been a risk to others in the past 6 months? No.  Has the patient been a risk to others within the distant past? No.   [place] Scale:  Flowsheet Row Admission (Current) from 09/01/2022 in [facility]  BEHAVIORAL MEDICINE Most recent reading at 09/01/2022 11:00 PM ED from 09/01/2022 in [facility] Emergency Department at [facility] Most recent reading at 09/01/2022  1:00 PM ED from 08/08/2022 in [facility] at [facility]  Most recent reading at 08/08/2022  9:27 AM  C-SSRS RISK CATEGORY Low Risk Low Risk No Risk        Prior Inpatient Therapy: No.  Prior Outpatient Therapy: Yes.      Alcohol Screening: Patient refused Alcohol Screening Tool: Yes 1. How often do you have a drink containing alcohol?: 2 to 3 times a week 2. How many drinks containing alcohol do you have on a typical day when you are drinking?: 1 or 2 3. How often do you have six or more drinks on one occasion?: Never AUDIT-C Score: 3 4. How often during the last year have you found that you were not able to stop drinking once you had started?: Never 5. How often during the last year have you failed to do what was normally expected from you because of drinking?: Never 6. How often during the last year have you needed a first drink in the morning to get yourself going after a heavy drinking session?: Never 7. How often during the last year have you had a feeling of guilt of remorse after drinking?: Never 8. How often during the last year have you been unable to remember what happened the night before because you had been drinking?: Never 9. Have you or someone else been injured as a result of your drinking?: No  10. Has a relative or friend or a doctor or another health worker been concerned about your drinking or suggested you cut down?: No Alcohol Use Disorder Identification Test Final Score (AUDIT): 3 Alcohol Brief Interventions/Follow-up: Alcohol education/Brief advice Substance Abuse History in the last 12 months:   Alcohol: Reports 2 beers every day Drugs: denies use of illicit drugs Nicotine: Denies use  Past Medical History:  Past Medical History:  Diagnosis Date   Anxiety    Depression    GERD  (gastroesophageal reflux disease)    Hyperlipidemia    Hypertension    Hypothyroidism     Past Surgical History:  Procedure Laterality Date   ABDOMINAL AORTIC ANEURYSM REPAIR     COLONOSCOPY WITH PROPOFOL N/A 05/10/2018   Procedure: COLONOSCOPY WITH PROPOFOL;  Surgeon: Midge Minium, MD;  Location: [facility] ENDOSCOPY;  Service: Endoscopy;  Laterality: N/A;   JOINT REPLACEMENT     TONSILLECTOMY AND ADENOIDECTOMY     Family History:  Family History  Problem Relation Age of Onset   Varicose Veins Father    Heart attack Brother    Diabetes Brother    Tobacco Screening:  Social History   Tobacco Use  Smoking Status Never   Passive exposure: Never  Smokeless Tobacco Never    BH Tobacco Counseling     Are you interested in Tobacco Cessation Medications?  No value filed. Counseled patient on smoking cessation:  No value filed. Reason Tobacco Screening Not Completed: No value filed.       Social History:  Social History   Substance and Sexual Activity  Alcohol Use Yes     Social History   Substance and Sexual Activity  Drug Use No    Additional Social History:     Patient reports that he lives alone.  He reports his wife of 24 years died 2 years ago because of medical complications patient Patient lost his brother in January 2024.  Patient reports 2 grownup children, reports still living in the [place].                      Allergies:   Allergies  Allergen Reactions   Prednisone     Other reaction(s): Other (See Comments) irritiability   Lab Results:  Results for orders placed or performed during the hospital encounter of 09/01/22 (from the past 48 hour(s))  Urine Drug Screen, Qualitative     Status: None   Collection Time: 09/01/22  1:00 PM  Result Value Ref Range   Tricyclic, Ur Screen NONE DETECTED NONE DETECTED   Amphetamines, Ur Screen NONE DETECTED NONE DETECTED   MDMA (Ecstasy)Ur Screen NONE DETECTED NONE DETECTED   Cocaine Metabolite,Ur  [place] NONE DETECTED NONE DETECTED   Opiate, Ur Screen NONE DETECTED NONE DETECTED   Phencyclidine (PCP) Ur S NONE DETECTED NONE DETECTED   Cannabinoid 50 Ng, Ur [place] NONE DETECTED NONE DETECTED   Barbiturates, Ur Screen NONE DETECTED NONE DETECTED   Benzodiazepine, Ur Scrn NONE DETECTED NONE DETECTED   Methadone Scn, Ur NONE DETECTED NONE DETECTED    Comment: (NOTE) Tricyclics + metabolites, urine    Cutoff 1000 ng/mL Amphetamines + metabolites, urine  Cutoff 1000 ng/mL MDMA (Ecstasy), urine              Cutoff 500 ng/mL Cocaine Metabolite, urine          Cutoff 300 ng/mL Opiate + metabolites, urine        Cutoff 300 ng/mL Phencyclidine (PCP), urine  Cutoff 25 ng/mL Cannabinoid, urine                 Cutoff 50 ng/mL Barbiturates + metabolites, urine  Cutoff 200 ng/mL Benzodiazepine, urine              Cutoff 200 ng/mL Methadone, urine                   Cutoff 300 ng/mL  The urine drug screen provides only a preliminary, unconfirmed analytical test result and should not be used for non-medical purposes. Clinical consideration and professional judgment should be applied to any positive drug screen result due to possible interfering substances. A more specific alternate chemical method must be used in order to obtain a confirmed analytical result. Gas chromatography / mass spectrometry (GC/MS) is the preferred confirm atory method. Performed at [facility], [address] [place], [place] [address]   Comprehensive metabolic panel     Status: Abnormal   Collection Time: 09/01/22  1:02 PM  Result Value Ref Range   Sodium 134 (L) 135 - 145 mmol/L   Potassium 3.8 3.5 - 5.1 mmol/L   Chloride 101 98 - 111 mmol/L   CO2 24 22 - 32 mmol/L   Glucose, Bld 100 (H) 70 - 99 mg/dL    Comment: Glucose reference range applies only to samples taken after fasting for at least 8 hours.   BUN 15 8 - 23 mg/dL   Creatinine, Ser 6.04 0.61 - 1.24 mg/dL   Calcium 9.5 8.9 - 54.0 mg/dL    Total Protein 7.4 6.5 - 8.1 g/dL   Albumin 4.8 3.5 - 5.0 g/dL   AST 15 15 - 41 U/L   ALT 14 0 - 44 U/L   Alkaline Phosphatase 62 38 - 126 U/L   Total Bilirubin 0.5 0.3 - 1.2 mg/dL   GFR, Estimated >98 >11 mL/min    Comment: (NOTE) Calculated using the CKD-EPI Creatinine Equation (2021)    Anion gap 9 5 - 15    Comment: Performed at [facility], [address], [place], [place] [address]  Salicylate level     Status: Abnormal   Collection Time: 09/01/22  1:02 PM  Result Value Ref Range   Salicylate Lvl <7.0 (L) 7.0 - 30.0 mg/dL    Comment: Performed at [facility], [address] [place], [place] [address]  Acetaminophen level     Status: None   Collection Time: 09/01/22  1:02 PM  Result Value Ref Range   Acetaminophen (Tylenol), Serum 14 10 - 30 ug/mL    Comment: (NOTE) Therapeutic concentrations vary significantly. A range of 10-30 ug/mL  may be an effective concentration for many patients. However, some  are best treated at concentrations outside of this range. Acetaminophen concentrations >150 ug/mL at 4 hours after ingestion  and >50 ug/mL at 12 hours after ingestion are often associated with  toxic reactions.  Performed at [facility], [address] [place], [place] [address]   cbc     Status: None   Collection Time: 09/01/22  1:02 PM  Result Value Ref Range   WBC 6.8 4.0 - 10.5 K/uL   RBC 4.59 4.22 - 5.81 MIL/uL   Hemoglobin 14.7 13.0 - 17.0 g/dL   HCT 57.8 46.9 - 62.9 %   MCV 97.4 80.0 - 100.0 fL   MCH 32.0 26.0 - 34.0 pg   MCHC 32.9 30.0 - 36.0 g/dL   RDW 52.8 41.3 - 24.4 %  Platelets 242 150 - 400 K/uL   nRBC 0.0 0.0 - 0.2 %    Comment: Performed at [facility], [address] [place], [place] [address]  Ethanol     Status: None   Collection Time: 09/01/22  1:02 PM  Result Value Ref Range   Alcohol, Ethyl (B) <10 <10 mg/dL    Comment: (NOTE) Lowest detectable limit for serum alcohol is 10 mg/dL.  For  medical purposes only. Performed at [facility], [address] [place], [place] [address]     Blood Alcohol level:  Lab Results  Component Value Date   [facility] <10 09/01/2022    Metabolic Disorder Labs:  No results found for: "HGBA1C", "MPG" No results found for: "PROLACTIN" No results found for: "CHOL", "TRIG", "HDL", "CHOLHDL", "VLDL", "LDLCALC"  Current Medications: Current Facility-Administered Medications  Medication Dose Route Frequency Provider Last Rate Last Admin   acetaminophen (TYLENOL) tablet 650 mg  650 mg Oral Q6H PRN Onuoha, Chinwendu V, NP   650 mg at 09/02/22 0838   alum & mag hydroxide-simeth (MAALOX/MYLANTA) 200-200-20 MG/5ML suspension 30 mL  30 mL Oral Q4H PRN Onuoha, Chinwendu V, NP       diphenhydrAMINE (BENADRYL) capsule 50 mg  50 mg Oral TID PRN Onuoha, Chinwendu V, NP   50 mg at 09/01/22 2324   Or   diphenhydrAMINE (BENADRYL) injection 50 mg  50 mg Intramuscular TID PRN Onuoha, Chinwendu V, NP       feeding supplement (ENSURE ENLIVE / ENSURE PLUS) liquid 237 mL  237 mL Oral BID BM Sarina Ill, DO   237 mL at 09/02/22 0848   FLUoxetine (PROZAC) capsule 20 mg  20 mg Oral Daily Onuoha, Chinwendu V, NP   20 mg at 09/02/22 0838   hydrOXYzine (ATARAX) tablet 25 mg  25 mg Oral TID PRN Onuoha, Chinwendu V, NP   25 mg at 09/02/22 0838   ipratropium (ATROVENT) 0.03 % nasal spray 2 spray  2 spray Each Nare BID Sindy Guadeloupe, NP   2 spray at 09/02/22 0838   loratadine (CLARITIN) tablet 10 mg  10 mg Oral Daily Lewanda Rife, MD   10 mg at 09/02/22 1150   magnesium hydroxide (MILK OF MAGNESIA) suspension 30 mL  30 mL Oral Daily PRN Onuoha, Chinwendu V, NP       multivitamin with minerals tablet 1 tablet  1 tablet Oral Daily Sarina Ill, DO   1 tablet at 09/02/22 0838   pantoprazole (PROTONIX) EC tablet 40 mg  40 mg Oral BID AC Lewanda Rife, MD       PTA Medications: Medications Prior to Admission  Medication Sig Dispense Refill  Last Dose   benazepril (LOTENSIN) 20 MG tablet Take by mouth. (Patient not taking: Reported on 09/01/2022)      buPROPion (WELLBUTRIN XL) 150 MG 24 hr tablet Take 150 mg by mouth daily.      ciprofloxacin (CIPRO) 500 MG tablet Take 500 mg by mouth 2 (two) times daily. (Patient not taking: Reported on 09/01/2022)      clonazePAM (KLONOPIN) 0.5 MG tablet Take by mouth daily as needed.      esomeprazole (NEXIUM) 40 MG capsule Take 1 capsule (40 mg total) by mouth daily. (Patient not taking: Reported on 09/01/2022) 30 capsule 5    finasteride (PROSCAR) 5 MG tablet TAKE 1 TABLET EVERY DAY 90 tablet 3    FLUoxetine (PROZAC) 10 MG capsule Take 10 mg by mouth daily.  FLUoxetine (PROZAC) 20 MG capsule Take by mouth.      FLUoxetine (PROZAC) 40 MG capsule Take 40 mg by mouth daily. (Patient not taking: Reported on 09/01/2022)      fluticasone (FLONASE) 50 MCG/ACT nasal spray Place into the nose.      HYDROcodone-acetaminophen (NORCO/VICODIN) 5-325 MG tablet Take 1 tablet by mouth every 4 (four) hours as needed. (Patient not taking: Reported on 09/01/2022)      ipratropium (ATROVENT) 0.03 % nasal spray Place 2 sprays into both nostrils 2 (two) times daily.      levothyroxine (SYNTHROID, LEVOTHROID) 50 MCG tablet Take 50 mcg by mouth daily before breakfast.      loratadine (CLARITIN) 10 MG tablet Take by mouth.      LORazepam (ATIVAN) 0.5 MG tablet Take 0.5 mg by mouth daily.      losartan (COZAAR) 50 MG tablet Take 50 mg by mouth daily.      methocarbamol (ROBAXIN) 500 MG tablet Take 500 mg by mouth every 8 (eight) hours as needed. (Patient not taking: Reported on 09/01/2022)      montelukast (SINGULAIR) 10 MG tablet Take 10 mg by mouth at bedtime.      Omega-3 Fatty Acids (FISH OIL) 1000 MG CPDR Take by mouth.      pantoprazole (PROTONIX) 40 MG tablet Take 40 mg by mouth 2 (two) times daily.      pravastatin (PRAVACHOL) 40 MG tablet Take by mouth.      tadalafil (CIALIS) 5 MG tablet Take 1 tablet (5 mg total) by  mouth daily as needed for erectile dysfunction. 90 tablet 3     Musculoskeletal: Strength & Muscle Tone: within normal limits Gait & Station: normal Patient leans: N/A  Psychiatric Specialty Exam:  Presentation  General Appearance:  Appropriate for Environment; Casual   Eye Contact: Fair   Speech: Clear and Coherent   Speech Volume: Normal   Handedness: Right     Mood and Affect  Mood: Dysphoric; Depressed; Hopeless   Affect: Congruent; Depressed     Thought Process  Thought Processes: Goal Directed   Descriptions of Associations:Intact   Orientation:Full (Time, Place and Person)   Thought Content:Illogical (in that he has thoughts of ending his life)   History of Schizophrenia/Schizoaffective disorder:No   Duration of Psychotic Symptoms:No data recorded Hallucinations:Hallucinations: None   Ideas of Reference:None   Suicidal Thoughts:Suicidal Thoughts: Yes, Active SI Active Intent and/or Plan: -- (pt is guarded, denies plan but states he has thought about using his firearm)   Homicidal Thoughts:Homicidal Thoughts: No     Sensorium  Memory: Immediate Good; Recent Good; Remote Good   Judgment: Impaired   Insight: Fair     Art therapist  Concentration: Fair   Attention Span: Fair   Recall: Good   Fund of Knowledge: Good   Language: Good     Psychomotor Activity  Psychomotor Activity: Psychomotor Activity: Normal     Assets  Assets: Communication Skills; Desire for Improvement; Financial Resources/Insurance; Housing     Sleep  Sleep: Sleep: Fair Number of Hours of Sleep: 6       Physical Exam: Physical Exam Constitutional:      Appearance: Normal appearance.  HENT:     Head: Normocephalic and atraumatic.     Nose: Nose normal.  Eyes:     Pupils: Pupils are equal, round, and reactive to light.  Cardiovascular:     Rate and Rhythm: Normal rate.     Pulses: Normal pulses.  Pulmonary:  Effort:  Pulmonary effort is normal.  Skin:    General: Skin is warm and dry.  Neurological:     General: No focal deficit present.     Mental Status: He is alert.      Review of Systems  Constitutional:  Positive for malaise/fatigue.  HENT:  Negative for ear discharge and ear pain.   Eyes:  Negative for blurred vision and double vision.  Respiratory:  Negative for cough.   Cardiovascular:  Negative for chest pain and palpitations.  Gastrointestinal:  Negative for nausea and vomiting.  Neurological:  Negative for dizziness, speech change and headaches.  Psychiatric/Behavioral:  Positive for depression and suicidal ideas. The patient is nervous/anxious.   Blood pressure 139/81, pulse 74, temperature 98.1 F (36.7 C), resp. rate 18, height 6' (1.829 m), weight 83.9 kg, SpO2 95 %. Body mass index is 25.09 kg/m.  Treatment Plan Summary: Daily contact with patient to assess and evaluate symptoms and progress in treatment and Medication management  Observation Level/Precautions:  15 minute checks  Laboratory:    Psychotherapy:    Medications: Prozac 20 mg by mouth daily for depression  Consultations:    Discharge Concerns:    Estimated LOS: 5-6 days  Other:     Physician Treatment Plan for Primary Diagnosis: Adjustment disorder with mixed anxiety and depressed mood Long Term Goal(s): Improvement in symptoms so as ready for discharge  Short Term Goals: Ability to identify changes in lifestyle to reduce recurrence of condition will improve, Ability to verbalize feelings will improve, Ability to disclose and discuss suicidal ideas, Ability to demonstrate self-control will improve, Ability to identify and develop effective coping behaviors will improve, Compliance with prescribed medications will improve, and Ability to identify triggers associated with substance abuse/mental health issues will improve  I certify that inpatient services furnished can reasonably be expected to improve the  patient's condition.    Lewanda Rife, MD

## 2022-09-02 NOTE — Progress Notes (Signed)
Admission Note:  72 year old patient admitted to the unit voluntarily for depression and SI w/o a plan. Pt said "I have guns at home but doesn't everyone". Calm and cooperative. Pleasant of approach. PH/O Anxiety, Depression, GERD, HTN, COPD, OSA on CPAP. Reports feelings of loneliness and hopelessness since the passing of his wife several years ago. Pt lives alone. C/o headache and nasal congestion. Endorces anxiety. Anxiety level 7/10(0low,10high). Flat affect. Speech clear. Wears glasses. HOH. Hearing aids at home. Pt requesting his bedtime medications Klonopin, nasal spray and CPAP. Delora Fuel., NP notified. N.O. Klonopin 0.5 mg po once, Atrovent 0.03% nasal spray. Call Respiratory for CPAP machine per provider. Skin checked with another RN.  Ecchymotic discoloration to both arms dt broken blood vessels underneath the skin. Small raised reddened area to left buttock. Skin warm, dry and intact. Pt search and no contraband found. POC and unit policies explained. Verbalized understanding. Consents obtained. Food and fluids offered but refused. Q 15 min checks maintained for safety. Denies SI/HI/AVH. Contract for safety.

## 2022-09-02 NOTE — Progress Notes (Signed)
Patient wears CPAP at home but didn't bring his home machine. Order was obtained by MD. Patient is not a suicide risk. Placed patient on hospital machine at CPAP of 5.

## 2022-09-02 NOTE — Tx Team (Signed)
Initial Treatment Plan 09/02/2022 1:31 AM [name] ZOX:[id number]    PATIENT STRESSORS: Marital or family conflict   Traumatic event     PATIENT STRENGTHS: Ability for insight  Capable of independent living  Communication skills  Motivation for treatment/growth  Supportive family/friends    PATIENT IDENTIFIED PROBLEMS: Depression  Suicidal ideation                   DISCHARGE CRITERIA:  Ability to meet basic life and health needs Improved stabilization in mood, thinking, and/or behavior Verbal commitment to aftercare and medication compliance  PRELIMINARY DISCHARGE PLAN: Outpatient therapy Return to previous work or school arrangements  PATIENT/FAMILY INVOLVEMENT: This treatment plan has been presented to and reviewed with the patient, Mitchell Powell.  The patient and family have been given the opportunity to ask questions and make suggestions.  Foster Simpson, RN 09/02/2022, 1:31 AM

## 2022-09-02 NOTE — Progress Notes (Signed)
NUTRITION ASSESSMENT  Pt identified as at risk on the Malnutrition Screen Tool  INTERVENTION:  -Continue regular diet -Continue Ensure Enlive po BID, each supplement provides 350 kcal and 20 grams of protein -MVI with minerals daily  NUTRITION DIAGNOSIS: Unintentional weight loss related to sub-optimal intake as evidenced by pt report.   Goal: Pt to meet >/= 90% of their estimated nutrition needs.  Monitor:  PO intake  Assessment:  Pt admitted with adjustment disorder with mixed anxiety and depressed mood.   Per psychiatry notes, pt endorses suicidal ideations PTA. He has been triggered by personal issuies, medical concerns, and psychosocial stressors. He has limited support.   Per RN notes, pt refused food and liquids on admission. He is currently on a regular diet. No meal completion data available to assess at this time.   Reviewed wt hx; pt has experienced a 5.2%v wt loss over the past 6 months, which is not significant for time frame.   Medications reviewed.   Labs reviewed: Na: 134.    72 y.o. male  Height: Ht Readings from Last 1 Encounters:  09/01/22 6' (1.829 m)    Weight: Wt Readings from Last 1 Encounters:  09/01/22 83.9 kg    Weight Hx: Wt Readings from Last 10 Encounters:  09/01/22 83.9 kg  09/01/22 85.7 kg  06/20/22 88.5 kg  06/09/22 88.5 kg  04/01/22 88.5 kg  03/10/22 88.5 kg  02/11/22 89.8 kg  12/12/21 90.4 kg  09/12/21 89.8 kg  11/26/20 91.6 kg    BMI:  Body mass index is 25.09 kg/m. BMI WDL.   Estimated Nutritional Needs: Kcal: 25-30 kcal/kg Protein: > 1 gram protein/kg Fluid: 1 ml/kcal  Diet Order:  Diet Order             Diet regular Room service appropriate? Yes; Fluid consistency: Thin  Diet effective now                  Pt is also offered choice of unit snacks mid-morning and mid-afternoon.  Pt is eating as desired.   Lab results and medications reviewed.   Levada Schilling, RD, LDN, CDCES Registered Dietitian  II Certified Diabetes Care and Education Specialist Please refer to [facility] for RD and/or RD on-call/weekend/after hours pager

## 2022-09-02 NOTE — Group Note (Signed)
Date:  09/02/2022 Time:  8:49 PM  Group Topic/Focus:  Managing Feelings:   The focus of this group is to identify what feelings patients have difficulty handling and develop a plan to handle them in a healthier way upon discharge. Self Care:   The focus of this group is to help patients understand the importance of self-care in order to improve or restore emotional, physical, spiritual, interpersonal, and financial health.    Participation Level:  Active  Participation Quality:  Appropriate  Affect:  Appropriate  Cognitive:  Appropriate  Insight: Appropriate  Engagement in Group:  Engaged  Modes of Intervention:  Discussion  Additional Comments:  Pt was engaged in group about anxiety.  Jaquita Rector 09/02/2022, 8:49 PM

## 2022-09-02 NOTE — Progress Notes (Signed)
D: Patient alert and oriented times 3. Pt denies SI, HI, AVH. Pt endorses anxiety and depression. No other concerns noted at this time.    A: Pt provided support and encouragement throughout the day. Scheduled medications given as prescribed. Takes medications whole without issue.    R: Pt remains safe on the unit with Q15 min safety checks in place. Will continue to monitor for changes.  

## 2022-09-02 NOTE — Plan of Care (Signed)
Patient new to the unit haven't time to progress.   Problem: Education: Goal: Ability to state activities that reduce stress will improve Outcome: Progressing   Problem: Coping: Goal: Ability to identify and develop effective coping behavior will improve Outcome: Progressing   Problem: Self-Concept: Goal: Ability to identify factors that promote anxiety will improve Outcome: Progressing Goal: Level of anxiety will decrease Outcome: Progressing Goal: Ability to modify response to factors that promote anxiety will improve Outcome: Progressing   Problem: Coping: Goal: Coping ability will improve Outcome: Progressing Goal: Will verbalize feelings Outcome: Progressing   Problem: Role Relationship: Goal: Will demonstrate positive changes in social behaviors and relationships Outcome: Progressing   Problem: Safety: Goal: Ability to disclose and discuss suicidal ideas will improve Outcome: Progressing   Problem: Self-Concept: Goal: Will verbalize positive feelings about self Outcome: Progressing Goal: Level of anxiety will decrease Outcome: Progressing

## 2022-09-02 NOTE — Progress Notes (Signed)
   09/02/22 0719  Psych Admission Type (Psych Patients Only)  Admission Status Voluntary  Psychosocial Assessment  Patient Complaints Anxiety  Eye Contact Fair  Facial Expression Animated  Affect Anxious;Blunted;Irritable  Speech Logical/coherent  Interaction Needy;Assertive;Sarcastic  Motor Activity Other (Comment) (wnl)  Appearance/Hygiene Unremarkable  Behavior Characteristics Anxious  Mood Anxious;Irritable  Thought Process  Coherency WDL  Content WDL  Delusions None reported or observed  Perception WDL  Hallucination None reported or observed  Judgment WDL  Confusion None  Danger to Self  Current suicidal ideation? Denies  Danger to Others  Danger to Others None reported or observed

## 2022-09-02 NOTE — [facility] Suicide Risk Assessment (Signed)
[facility] Admission Suicide Risk Assessment   Nursing information obtained from:    Demographic factors:  Age 72 or older, Access to firearms, Living alone Current Mental Status:  Suicidal ideation indicated by patient Loss Factors:  Loss of significant relationship Historical Factors:  Anniversary of important loss Risk Reduction Factors:  NA  Principal Problem: Adjustment disorder with mixed anxiety and depressed mood Diagnosis:  Principal Problem:   Adjustment disorder with mixed anxiety and depressed mood  Subjective Data: Mitchell Powell is a 72 y.o. male who presents to the emergency department today because of concerns for depression.  He says he has battled with depression for about 20 years however over the past 2 years things been worse since the death of his wife. He endorses hopelessness, decreased appetite, and decreased sleep.   Continued Clinical Symptoms:  Alcohol Use Disorder Identification Test Final Score (AUDIT): 3 The "Alcohol Use Disorders Identification Test", Guidelines for Use in Primary Care, Second Edition.  World Science writer [facility]). Score between 0-7:  no or low risk or alcohol related problems. Score between 8-15:  moderate risk of alcohol related problems. Score between 16-19:  high risk of alcohol related problems. Score 20 or above:  warrants further diagnostic evaluation for alcohol dependence and treatment.   CLINICAL FACTORS:   Depression:   Anhedonia Hopelessness Insomnia   Musculoskeletal: Strength & Muscle Tone: within normal limits Gait & Station: normal Patient leans: N/A  Psychiatric Specialty Exam:  Presentation  General Appearance:  Appropriate for Environment; Casual  Eye Contact: Fair  Speech: Clear and Coherent  Speech Volume: Normal  Handedness: Right   Mood and Affect  Mood: Dysphoric; Depressed; Hopeless  Affect: Congruent; Depressed   Thought Process  Thought Processes: Goal Directed  Descriptions of  Associations:Intact  Orientation:Full (Time, Place and Person)  Thought Content:Illogical (in that he has thoughts of ending his life)  History of Schizophrenia/Schizoaffective disorder:No  Duration of Psychotic Symptoms:No data recorded Hallucinations:Hallucinations: None  Ideas of Reference:None  Suicidal Thoughts:Suicidal Thoughts: Yes, Active SI Active Intent and/or Plan: -- (pt is guarded, denies plan but states he has thought about using his firearm)  Homicidal Thoughts:Homicidal Thoughts: No   Sensorium  Memory: Immediate Good; Recent Good; Remote Good  Judgment: Impaired  Insight: Fair   Art therapist  Concentration: Fair  Attention Span: Fair  Recall: Good  Fund of Knowledge: Good  Language: Good   Psychomotor Activity  Psychomotor Activity: Psychomotor Activity: Normal   Assets  Assets: Communication Skills; Desire for Improvement; Financial Resources/Insurance; Housing   Sleep  Sleep: Sleep: Fair Number of Hours of Sleep: 6    Physical Exam: Physical Exam Constitutional:      Appearance: Normal appearance.  HENT:     Head: Normocephalic and atraumatic.     Nose: Nose normal.  Eyes:     Pupils: Pupils are equal, round, and reactive to light.  Cardiovascular:     Rate and Rhythm: Normal rate.     Pulses: Normal pulses.  Pulmonary:     Effort: Pulmonary effort is normal.  Skin:    General: Skin is warm and dry.  Neurological:     General: No focal deficit present.     Mental Status: He is alert.    Review of Systems  Constitutional:  Positive for malaise/fatigue.  HENT:  Negative for ear discharge and ear pain.   Eyes:  Negative for blurred vision and double vision.  Respiratory:  Negative for cough.   Cardiovascular:  Negative for chest  pain and palpitations.  Gastrointestinal:  Negative for nausea and vomiting.  Neurological:  Negative for dizziness, speech change and headaches.  Psychiatric/Behavioral:   Positive for depression and suicidal ideas. The patient is nervous/anxious.    Blood pressure 139/81, pulse 74, temperature 98.1 F (36.7 C), resp. rate 18, height 6' (1.829 m), weight 83.9 kg, SpO2 95 %. Body mass index is 25.09 kg/m.   COGNITIVE FEATURES THAT CONTRIBUTE TO RISK:  Thought constriction (tunnel vision)    SUICIDE RISK:   Moderate:  Frequent suicidal ideation with limited intensity, and duration, some specificity in terms of plans, no associated intent, good self-control, limited dysphoria/symptomatology, some risk factors present, and identifiable protective factors, including available and accessible social support.  PLAN OF CARE: See H&P  I certify that inpatient services furnished can reasonably be expected to improve the patient's condition.   Lewanda Rife, MD 09/02/2022, 2:22 PM

## 2022-09-02 NOTE — Group Note (Signed)
Date:  09/02/2022 Time:  9:48 AM  Group Topic/Focus:  Making Healthy Choices:   The focus of this group is to help patients identify negative/unhealthy choices they were using prior to admission and identify positive/healthier coping strategies to replace them upon discharge.    Participation Level:  Active  Participation Quality:  Appropriate  Affect:  Appropriate  Cognitive:  Appropriate  Insight: Appropriate  Engagement in Group:  Engaged  Modes of Intervention:  Activity  Additional Comments:  None  Rodena Goldmann 09/02/2022, 9:48 AM

## 2022-09-02 NOTE — BH IP Treatment Plan (Signed)
Interdisciplinary Treatment and Diagnostic Plan Update  09/02/2022 Time of Session: 10:16 AM  Mitchell Powell MRN: [id number]  Principal Diagnosis: Adjustment disorder with mixed anxiety and depressed mood  Secondary Diagnoses: Principal Problem:   Adjustment disorder with mixed anxiety and depressed mood   Current Medications:  Current Facility-Administered Medications  Medication Dose Route Frequency Provider Last Rate Last Admin   acetaminophen (TYLENOL) tablet 650 mg  650 mg Oral Q6H PRN Onuoha, Chinwendu V, NP   650 mg at 09/02/22 0838   alum & mag hydroxide-simeth (MAALOX/MYLANTA) 200-200-20 MG/5ML suspension 30 mL  30 mL Oral Q4H PRN Onuoha, Chinwendu V, NP       diphenhydrAMINE (BENADRYL) capsule 50 mg  50 mg Oral TID PRN Onuoha, Chinwendu V, NP   50 mg at 09/01/22 2324   Or   diphenhydrAMINE (BENADRYL) injection 50 mg  50 mg Intramuscular TID PRN Onuoha, Chinwendu V, NP       feeding supplement (ENSURE ENLIVE / ENSURE PLUS) liquid 237 mL  237 mL Oral BID BM Sarina Ill, DO   237 mL at 09/02/22 0848   FLUoxetine (PROZAC) capsule 20 mg  20 mg Oral Daily Onuoha, Chinwendu V, NP   20 mg at 09/02/22 0838   hydrOXYzine (ATARAX) tablet 25 mg  25 mg Oral TID PRN Onuoha, Chinwendu V, NP   25 mg at 09/02/22 0838   ipratropium (ATROVENT) 0.03 % nasal spray 2 spray  2 spray Each Nare BID Sindy Guadeloupe, NP   2 spray at 09/02/22 0838   loratadine (CLARITIN) tablet 10 mg  10 mg Oral Daily Lewanda Rife, MD       magnesium hydroxide (MILK OF MAGNESIA) suspension 30 mL  30 mL Oral Daily PRN Onuoha, Chinwendu V, NP       multivitamin with minerals tablet 1 tablet  1 tablet Oral Daily Sarina Ill, DO   1 tablet at 09/02/22 0838   pantoprazole (PROTONIX) EC tablet 40 mg  40 mg Oral BID AC Parmar, Meenakshi, MD       pantoprazole (PROTONIX) EC tablet 40 mg  40 mg Oral NOW Lewanda Rife, MD       PTA Medications: Medications Prior to Admission  Medication Sig Dispense  Refill Last Dose   benazepril (LOTENSIN) 20 MG tablet Take by mouth. (Patient not taking: Reported on 09/01/2022)      buPROPion (WELLBUTRIN XL) 150 MG 24 hr tablet Take 150 mg by mouth daily.      ciprofloxacin (CIPRO) 500 MG tablet Take 500 mg by mouth 2 (two) times daily. (Patient not taking: Reported on 09/01/2022)      clonazePAM (KLONOPIN) 0.5 MG tablet Take by mouth daily as needed.      esomeprazole (NEXIUM) 40 MG capsule Take 1 capsule (40 mg total) by mouth daily. (Patient not taking: Reported on 09/01/2022) 30 capsule 5    finasteride (PROSCAR) 5 MG tablet TAKE 1 TABLET EVERY DAY 90 tablet 3    FLUoxetine (PROZAC) 10 MG capsule Take 10 mg by mouth daily.      FLUoxetine (PROZAC) 20 MG capsule Take by mouth.      FLUoxetine (PROZAC) 40 MG capsule Take 40 mg by mouth daily. (Patient not taking: Reported on 09/01/2022)      fluticasone (FLONASE) 50 MCG/ACT nasal spray Place into the nose.      HYDROcodone-acetaminophen (NORCO/VICODIN) 5-325 MG tablet Take 1 tablet by mouth every 4 (four) hours as needed. (Patient not taking: Reported on 09/01/2022)  ipratropium (ATROVENT) 0.03 % nasal spray Place 2 sprays into both nostrils 2 (two) times daily.      levothyroxine (SYNTHROID, LEVOTHROID) 50 MCG tablet Take 50 mcg by mouth daily before breakfast.      loratadine (CLARITIN) 10 MG tablet Take by mouth.      LORazepam (ATIVAN) 0.5 MG tablet Take 0.5 mg by mouth daily.      losartan (COZAAR) 50 MG tablet Take 50 mg by mouth daily.      methocarbamol (ROBAXIN) 500 MG tablet Take 500 mg by mouth every 8 (eight) hours as needed. (Patient not taking: Reported on 09/01/2022)      montelukast (SINGULAIR) 10 MG tablet Take 10 mg by mouth at bedtime.      Omega-3 Fatty Acids (FISH OIL) 1000 MG CPDR Take by mouth.      pantoprazole (PROTONIX) 40 MG tablet Take 40 mg by mouth 2 (two) times daily.      pravastatin (PRAVACHOL) 40 MG tablet Take by mouth.      tadalafil (CIALIS) 5 MG tablet Take 1 tablet (5 mg  total) by mouth daily as needed for erectile dysfunction. 90 tablet 3     Patient Stressors: Marital or family conflict   Traumatic event    Patient Strengths: Ability for insight  Capable of independent living  Manufacturing systems engineer  Motivation for treatment/growth  Supportive family/friends   Treatment Modalities: Medication Management, Group therapy, Case management,  1 to 1 session with clinician, Psychoeducation, Recreational therapy.   Physician Treatment Plan for Primary Diagnosis: Adjustment disorder with mixed anxiety and depressed mood Long Term Goal(s):     Short Term Goals:    Medication Management: Evaluate patient's response, side effects, and tolerance of medication regimen.  Therapeutic Interventions: 1 to 1 sessions, Unit Group sessions and Medication administration.  Evaluation of Outcomes: Not Met  Physician Treatment Plan for Secondary Diagnosis: Principal Problem:   Adjustment disorder with mixed anxiety and depressed mood  Long Term Goal(s):     Short Term Goals:       Medication Management: Evaluate patient's response, side effects, and tolerance of medication regimen.  Therapeutic Interventions: 1 to 1 sessions, Unit Group sessions and Medication administration.  Evaluation of Outcomes: Not Met   RN Treatment Plan for Primary Diagnosis: Adjustment disorder with mixed anxiety and depressed mood Long Term Goal(s): Knowledge of disease and therapeutic regimen to maintain health will improve  Short Term Goals: Ability to remain free from injury will improve, Ability to verbalize frustration and anger appropriately will improve, Ability to demonstrate self-control, Ability to participate in decision making will improve, Ability to verbalize feelings will improve, Ability to identify and develop effective coping behaviors will improve, and Compliance with prescribed medications will improve  Medication Management: RN will administer medications as  ordered by provider, will assess and evaluate patient's response and provide education to patient for prescribed medication. RN will report any adverse and/or side effects to prescribing provider.  Therapeutic Interventions: 1 on 1 counseling sessions, Psychoeducation, Medication administration, Evaluate responses to treatment, Monitor vital signs and CBGs as ordered, Perform/monitor CIWA, COWS, AIMS and Fall Risk screenings as ordered, Perform wound care treatments as ordered.  Evaluation of Outcomes: Not Met   LCSW Treatment Plan for Primary Diagnosis: Adjustment disorder with mixed anxiety and depressed mood Long Term Goal(s): Safe transition to appropriate next level of care at discharge, Engage patient in therapeutic group addressing interpersonal concerns.  Short Term Goals: Engage patient in aftercare planning with referrals and  resources, Increase social support, Increase ability to appropriately verbalize feelings, Increase emotional regulation, Facilitate acceptance of mental health diagnosis and concerns, Facilitate patient progression through stages of change regarding substance use diagnoses and concerns, Identify triggers associated with mental health/substance abuse issues, and Increase skills for wellness and recovery  Therapeutic Interventions: Assess for all discharge needs, 1 to 1 time with Social worker, Explore available resources and support systems, Assess for adequacy in community support network, Educate family and significant other(s) on suicide prevention, Complete Psychosocial Assessment, Interpersonal group therapy.  Evaluation of Outcomes: Not Met   Progress in Treatment: Attending groups: Yes. Participating in groups: Yes. Taking medication as prescribed: Yes. Toleration medication: Yes. Family/Significant other contact made: No, will contact:  CSW will contact if given permission  Patient understands diagnosis: Yes. Discussing patient identified problems/goals  with staff: Yes. Medical problems stabilized or resolved: Yes. Denies suicidal/homicidal ideation: Yes. Issues/concerns per patient self-inventory: No. Other: None identified  New problem(s) identified: No, Describe:  None identified   New Short Term/Long Term Goal(s):elimination of symptoms of psychosis, medication management for mood stabilization; elimination of SI thoughts; development of comprehensive mental wellness plan.   Patient Goals:  " I'd like to feel better"   Discharge Plan or Barriers: CSW will assist with appropriate discharge planning   Reason for Continuation of Hospitalization: Depression  Estimated Length of Stay: 1 to 7 days   Last 3 [place] Suicide Severity Risk Score: Flowsheet Row Admission (Current) from 09/01/2022 in [facility] Most recent reading at 09/01/2022 11:00 PM ED from 09/01/2022 in [facility] Most recent reading at 09/01/2022  1:00 PM ED from 08/08/2022 in [facility] at [facility]  Most recent reading at 08/08/2022  9:27 AM  C-SSRS RISK CATEGORY Low Risk Low Risk No Risk       Last PHQ 2/9 Scores:     No data to display          Scribe for Treatment Team: Elza Rafter, Theresia Majors 09/02/2022 10:39 AM

## 2022-09-02 NOTE — Group Note (Signed)
Recreation Therapy Group Note   Group Topic:General Recreation  Group Date: 09/02/2022 Start Time: 1400 End Time: 1455 Facilitators: Rosina Lowenstein, LRT, CTRS Location:  Day Room  Group Description: Bingo. Patients had the choice between exercise and Bingo. LRT and patients played multiple rounds of bingo with music playing in the background. Pts had the option of a stress ball, activity book, adult coloring book or composition book as a prize. LRT an pts discussed how this could be a leisure interest post-discharge.   Goal Area(s) Addressed: Patient will practice healthy decision making. Patient will engage in recreation activity. Patient will practice healthy decision making.   Affect/Mood: Appropriate   Participation Level: Active and Engaged   Participation Quality: Independent   Behavior: Appropriate, Calm, and Cooperative   Speech/Thought Process: Coherent   Insight: Good   Judgement: Good   Modes of Intervention: Activity   Patient Response to Interventions:  Attentive, Engaged, Interested , and Receptive   Education Outcome:  Acknowledges education   Clinical Observations/Individualized Feedback: Mitchell Powell was active in their participation of session activities and group discussion. Pt chose a stress ball as a Retail banker. Pt interacted well with LRT and peers duration of session.   Plan: Continue to engage patient in RT group sessions 2-3x/week.   Rosina Lowenstein, LRT, CTRS 09/02/2022 3:36 PM

## 2022-09-03 DIAGNOSIS — F322 Major depressive disorder, single episode, severe without psychotic features: Secondary | ICD-10-CM

## 2022-09-03 MED ORDER — CLONAZEPAM 0.5 MG PO TABS
0.5000 mg | ORAL_TABLET | Freq: Every day | ORAL | Status: DC
  Administered 2022-09-03 – 2022-09-06 (×4): 0.5 mg via ORAL
  Filled 2022-09-03 (×4): qty 1

## 2022-09-03 MED ORDER — LEVOTHYROXINE SODIUM 50 MCG PO TABS
50.0000 ug | ORAL_TABLET | Freq: Every day | ORAL | Status: DC
  Administered 2022-09-04 – 2022-09-07 (×4): 50 ug via ORAL
  Filled 2022-09-03 (×4): qty 1

## 2022-09-03 MED ORDER — LOSARTAN POTASSIUM 25 MG PO TABS
50.0000 mg | ORAL_TABLET | Freq: Every day | ORAL | Status: DC
  Administered 2022-09-03 – 2022-09-07 (×5): 50 mg via ORAL
  Filled 2022-09-03 (×5): qty 2

## 2022-09-03 MED ORDER — PRAVASTATIN SODIUM 40 MG PO TABS
40.0000 mg | ORAL_TABLET | Freq: Every day | ORAL | Status: DC
  Administered 2022-09-03 – 2022-09-06 (×4): 40 mg via ORAL
  Filled 2022-09-03 (×4): qty 1
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
## 2022-09-03 NOTE — Progress Notes (Signed)
Patient is alert & oriented x 4.  He is able to voice his complaints & concerns appropriately.  He visited with his step-daughter in the dayroom.  No s/sx of acute distress noted or reported.  He complained that he was not ordered Flonase or Klonopin.  This Clinical research associate explained that the 2 medications were not an emergency, and will pass on to the day nurse to consult with the MD.  Both were documented as home medications at admission.  He denied HI, SI, delusions, and hallucinations.  He is sociable and spoke with several people on the phone.  He smiled & laughed at times.  He tool all medications without difficulty.  He was given Hydroxyzine 25mg  as ordered for anxiety.  He was observed resting in bed with closed eyes.  His CPAP was in place.  No acute distress observed.  No adverse reactions reported or seen.  Q37m safety checks and plan of care in place.

## 2022-09-03 NOTE — Group Note (Signed)
Recreation Therapy Group Note   Group Topic:Healthy Support Systems  Group Date: 09/03/2022 Start Time: 1400 End Time: 1445 Facilitators: Rosina Lowenstein, LRT, CTRS Location: Courtyard  Group Description: Emotional Check in. Patient sat and talked with LRT about how they are doing and whatever else is on their mind. LRT provided active listening, reassurance and encouragement. Group was held outside in the courtyard to get fresh air and sunlight. Music was playing in the background.    Goal Area(s) Addressed: Patient will engage in conversation with LRT. Patient will communicate their wants, needs, or questions.  Patient will practice a new coping skill of "talking to someone".   Affect/Mood: N/A   Participation Level: Did not attend    Clinical Observations/Individualized Feedback: Mitchell Powell did not attend group despite encouragement.   Plan: Continue to engage patient in RT group sessions 2-3x/week.   Rosina Lowenstein, LRT, CTRS 09/03/2022 3:35 PM

## 2022-09-03 NOTE — Progress Notes (Signed)
Patient pleasant and cooperative.  Present in the dayroom for breakfast.  Endorses anxiety.  Denies SI, HI and AVH.  Denies pain.  Compliant with scheduled medications. 15 min checks in place for safety.  Dr. Marval Regal ordered patient's home medications per patient request.  Patient given PRN medication for upset stomach, anxiety and tylenol for a headache x2.    Patient continues to state his stomach hurts - no nausea, no vomitting.  Dr. Marval Regal made aware. He was able able to eat some dinner.

## 2022-09-03 NOTE — Plan of Care (Signed)
  Problem: Education: Goal: Knowledge of General Education information will improve Description: Including pain rating scale, medication(s)/side effects and non-pharmacologic comfort measures Outcome: Progressing   Problem: Activity: Goal: Risk for activity intolerance will decrease Outcome: Progressing   Problem: Nutrition: Goal: Adequate nutrition will be maintained Outcome: Progressing   Problem: Coping: Goal: Level of anxiety will decrease Outcome: Progressing   

## 2022-09-03 NOTE — Progress Notes (Signed)
[facility] MD Progress Note  09/03/2022 6:28 PM Mitchell Powell  MRN:  [id number]  Subjective: Patient seen during a.m. rounds, chart reviewed, and case discussed with staff.  Patient reports he is feeling better today.  Still having on and off headaches.  Patient talked about his daughters and grandkids.  Patient mentioned they live in [place].  Patient reports sometimes he feels lonely and bored as his family is not [place].  Patient was provided with support and reassurance.  Patient reports he has been attending groups and working on coping strategies.  He still is at risk for self-harm due to feeling of helplessness at times.  Patient denies any intention to harm himself on the unit. I talked to patient's daughter after patient gave verbal consent.  Daughter was concerned about patient's mental health and medicines he was taking prior to admission.  Patient has been started on medicine which he reports he was taking prior to admission.  Patient's daughter was reassured.  Daughter thanked Retail banker for addressing her concerns.  Principal Problem: Adjustment disorder with mixed anxiety and depressed mood Diagnosis: Principal Problem:   Adjustment disorder with mixed anxiety and depressed mood   Past Medical History:  Past Medical History:  Diagnosis Date   Anxiety    Depression    GERD (gastroesophageal reflux disease)    Hyperlipidemia    Hypertension    Hypothyroidism     Past Surgical History:  Procedure Laterality Date   ABDOMINAL AORTIC ANEURYSM REPAIR     COLONOSCOPY WITH PROPOFOL N/A 05/10/2018   Procedure: COLONOSCOPY WITH PROPOFOL;  Surgeon: Midge Minium, MD;  Location: [facility] ENDOSCOPY;  Service: Endoscopy;  Laterality: N/A;   JOINT REPLACEMENT     TONSILLECTOMY AND ADENOIDECTOMY     Family History:  Family History  Problem Relation Age of Onset   Varicose Veins Father    Heart attack Brother    Diabetes Brother     Social History:  Social History   Substance and Sexual  Activity  Alcohol Use Yes     Social History   Substance and Sexual Activity  Drug Use No    Social History   Socioeconomic History   Marital status: Widowed    Spouse name: Not on file   Number of children: Not on file   Years of education: Not on file   Highest education level: Not on file  Occupational History   Not on file  Tobacco Use   Smoking status: Never    Passive exposure: Never   Smokeless tobacco: Never  Substance and Sexual Activity   Alcohol use: Yes   Drug use: No   Sexual activity: Not on file  Other Topics Concern   Not on file  Social History Narrative   Not on file   Social Determinants of Health   Financial Resource Strain: Low Risk  (07/16/2022)   Received from [facility]   Overall Financial Resource Strain (CARDIA)    Difficulty of Paying Living Expenses: Not hard at all  Food Insecurity: No Food Insecurity (09/01/2022)   Hunger Vital Sign    Worried About Running Out of Food in the Last Year: Never true    Ran Out of Food in the Last Year: Never true  Transportation Needs: No Transportation Needs (09/01/2022)   PRAPARE - Administrator, Civil Service (Medical): No    Lack of Transportation (Non-Medical): No  Physical Activity: Not on file  Stress: Not on file  Social  Connections: Not on file   Additional Social History:                         Sleep: Fair  Appetite:  Good  Current Medications: Current Facility-Administered Medications  Medication Dose Route Frequency Provider Last Rate Last Admin   acetaminophen (TYLENOL) tablet 650 mg  650 mg Oral Q6H PRN Onuoha, Chinwendu V, NP   650 mg at 09/03/22 1706   alum & mag hydroxide-simeth (MAALOX/MYLANTA) 200-200-20 MG/5ML suspension 30 mL  30 mL Oral Q4H PRN Onuoha, Chinwendu V, NP   30 mL at 09/03/22 1333   clonazePAM (KLONOPIN) tablet 0.5 mg  0.5 mg Oral QHS Lewanda Rife, MD       diphenhydrAMINE (BENADRYL) capsule 50 mg  50 mg Oral TID PRN  Onuoha, Chinwendu V, NP   50 mg at 09/01/22 2324   Or   diphenhydrAMINE (BENADRYL) injection 50 mg  50 mg Intramuscular TID PRN Onuoha, Chinwendu V, NP       feeding supplement (ENSURE ENLIVE / ENSURE PLUS) liquid 237 mL  237 mL Oral BID BM Sarina Ill, DO   237 mL at 09/02/22 1710   FLUoxetine (PROZAC) capsule 20 mg  20 mg Oral Daily Onuoha, Chinwendu V, NP   20 mg at 09/03/22 0834   hydrOXYzine (ATARAX) tablet 25 mg  25 mg Oral TID PRN Onuoha, Chinwendu V, NP   25 mg at 09/03/22 1706   ipratropium (ATROVENT) 0.03 % nasal spray 2 spray  2 spray Each Nare BID Sindy Guadeloupe, NP   2 spray at 09/03/22 0833   [START ON 09/04/2022] levothyroxine (SYNTHROID) tablet 50 mcg  50 mcg Oral Q0600 Lewanda Rife, MD       loratadine (CLARITIN) tablet 10 mg  10 mg Oral Daily Lewanda Rife, MD   10 mg at 09/03/22 0834   losartan (COZAAR) tablet 50 mg  50 mg Oral Daily Lewanda Rife, MD   50 mg at 09/03/22 1234   magnesium hydroxide (MILK OF MAGNESIA) suspension 30 mL  30 mL Oral Daily PRN Onuoha, Chinwendu V, NP       multivitamin with minerals tablet 1 tablet  1 tablet Oral Daily Sarina Ill, DO   1 tablet at 09/03/22 0834   pantoprazole (PROTONIX) EC tablet 40 mg  40 mg Oral BID AC Lewanda Rife, MD   40 mg at 09/03/22 1705   pravastatin (PRAVACHOL) tablet 40 mg  40 mg Oral q1800 Lewanda Rife, MD   40 mg at 09/03/22 1705    Lab Results: No results found for this or any previous visit (from the past 48 hour(s)).  Blood Alcohol level:  Lab Results  Component Value Date   ETH <10 09/01/2022    Metabolic Disorder Labs: No results found for: "HGBA1C", "MPG" No results found for: "PROLACTIN" No results found for: "CHOL", "TRIG", "HDL", "CHOLHDL", "VLDL", "LDLCALC"   Musculoskeletal: Strength & Muscle Tone: within normal limits Gait & Station: normal Patient leans: N/A   Psychiatric Specialty Exam:   Presentation  General Appearance:  Appropriate for  Environment; Casual   Eye Contact: Fair   Speech: Clear and Coherent   Speech Volume: Normal   Handedness: Right     Mood and Affect  Mood: Depressed; Hopeless   Affect: Constricted     Thought Process  Thought Processes: Goal Directed   Descriptions of Associations:Intact   Orientation:Full (Time, Place and Person)   Thought Content: Improved   History  of Schizophrenia/Schizoaffective disorder:No   Hallucinations:Hallucinations: None   Ideas of Reference:None   Suicidal Thoughts:Suicidal Thoughts:Passive   Homicidal Thoughts:Homicidal Thoughts: No     Sensorium  Memory: Immediate Good; Recent Good; Remote Good   Judgment: Impaired   Insight: Fair     Chartered certified accountant: Fair   Attention Span: Fair   Recall: Good   Fund of Knowledge: Good   Language: Good   Psychomotor Activity: Psychomotor Activity: Normal     Assets: Communication Skills; Desire for Improvement; Financial Resources/Insurance; Housing    Sleep: Sleep: Fair Number of Hours of Sleep: 6    Physical Exam: Physical Exam Constitutional:      Appearance: Normal appearance.  HENT:     Head: Normocephalic and atraumatic.     Nose: Nose normal.  Eyes:     Pupils: Pupils are equal, round, and reactive to light.  Cardiovascular:     Rate and Rhythm: Normal rate.     Pulses: Normal pulses.  Pulmonary:     Effort: Pulmonary effort is normal.  Skin:    General: Skin is warm and dry.  Neurological:     General: No focal deficit present.     Mental Status: He is alert.      Review of Systems  Constitutional:  Positive for Headache HENT:  Negative for ear discharge and ear pain.   Eyes:  Negative for blurred vision and double vision.  Respiratory:  Negative for cough.   Cardiovascular:  Negative for chest pain and palpitations.  Gastrointestinal:  Negative for nausea and vomiting.  Neurological:  Negative for dizziness, speech  change  Blood pressure (!) 147/76, pulse 74, temperature 98.2 F (36.8 C), resp. rate 18, height 6' (1.829 m), weight 83.9 kg, SpO2 94%. Body mass index is 25.09 kg/m.   Treatment Plan Summary: Daily contact with patient to assess and evaluate symptoms and progress in treatment and Medication management  Lewanda Rife, MD

## 2022-09-03 NOTE — Progress Notes (Signed)
   09/03/22 2300  Psych Admission Type (Psych Patients Only)  Admission Status Voluntary  Psychosocial Assessment  Patient Complaints Anxiety  Eye Contact Fair  Facial Expression Animated  Affect Appropriate to circumstance  Speech Logical/coherent  Interaction Assertive  Motor Activity Other (Comment) (unremakrable)  Appearance/Hygiene Unremarkable  Behavior Characteristics Cooperative;Appropriate to situation  Mood Anxious;Pleasant  Thought Process  Coherency WDL  Content WDL  Delusions None reported or observed  Perception WDL  Hallucination None reported or observed  Judgment WDL  Confusion None  Danger to Self  Current suicidal ideation? Denies  Danger to Others  Danger to Others None reported or observed

## 2022-09-03 NOTE — Group Note (Signed)
Date:  09/03/2022 Time:  9:14 PM  Group Topic/Focus:  Coping With Mental Health Crisis:   The purpose of this group is to help patients identify strategies for coping with mental health crisis.  Group discusses possible causes of crisis and ways to manage them effectively.    Participation Level:  Active  Participation Quality:  Attentive  Affect:  Appropriate  Cognitive:  Appropriate and Oriented  Insight: Appropriate and Good  Engagement in Group:  Engaged  Modes of Intervention:  Discussion  Additional Comments:  Adult Unit Workbook [id number]  Roberto Scales 09/03/2022, 9:14 PM

## 2022-09-04 DIAGNOSIS — J3489 Other specified disorders of nose and nasal sinuses: Secondary | ICD-10-CM

## 2022-09-04 DIAGNOSIS — R1013 Epigastric pain: Secondary | ICD-10-CM

## 2022-09-04 MED ORDER — WHITE PETROLATUM EX OINT
TOPICAL_OINTMENT | CUTANEOUS | Status: DC | PRN
  Filled 2022-09-04: qty 5

## 2022-09-04 MED ORDER — PANTOPRAZOLE SODIUM 40 MG PO TBEC
80.0000 mg | DELAYED_RELEASE_TABLET | Freq: Two times a day (BID) | ORAL | Status: DC
  Administered 2022-09-05 – 2022-09-07 (×5): 80 mg via ORAL
  Filled 2022-09-04 (×5): qty 2

## 2022-09-04 NOTE — Progress Notes (Signed)
[facility] [address] MD Progress Note  09/04/2022 9:19 PM Mitchell Powell  MRN:  [id number]  Subjective: Patient seen during a.m. rounds, chart reviewed, and case discussed with staff.  Patient reports he is doing better except for pain  abdomen. Patient is on Protonix for GERD.  He also continues to report sinusitis headaches.  Hospitalist group consulted to address patient's medical needs.  He still feels sad and lonely at times.  He was encouraged to work on coping strategies.  Principal Problem: Adjustment disorder with mixed anxiety and depressed mood Diagnosis: Principal Problem:   Adjustment disorder with mixed anxiety and depressed mood   Past Medical History:  Past Medical History:  Diagnosis Date   Anxiety    Depression    GERD (gastroesophageal reflux disease)    Hyperlipidemia    Hypertension    Hypothyroidism     Past Surgical History:  Procedure Laterality Date   ABDOMINAL AORTIC ANEURYSM REPAIR     COLONOSCOPY WITH PROPOFOL N/A 05/10/2018   Procedure: COLONOSCOPY WITH PROPOFOL;  Surgeon: Midge Minium, MD;  Location: [facility] ENDOSCOPY;  Service: Endoscopy;  Laterality: N/A;   JOINT REPLACEMENT     TONSILLECTOMY AND ADENOIDECTOMY     Family History:  Family History  Problem Relation Age of Onset   Varicose Veins Father    Heart attack Brother    Diabetes Brother     Social History:  Social History   Substance and Sexual Activity  Alcohol Use Yes     Social History   Substance and Sexual Activity  Drug Use No    Social History   Socioeconomic History   Marital status: Widowed    Spouse name: Not on file   Number of children: Not on file   Years of education: Not on file   Highest education level: Not on file  Occupational History   Not on file  Tobacco Use   Smoking status: Never    Passive exposure: Never   Smokeless tobacco: Never  Substance and Sexual Activity   Alcohol use: Yes   Drug use: No   Sexual activity: Not on file  Other Topics Concern   Not on  file  Social History Narrative   Not on file   Social Determinants of Health   Financial Resource Strain: Low Risk  (07/16/2022)   Received from [facility]   Overall Financial Resource Strain (CARDIA)    Difficulty of Paying Living Expenses: Not hard at all  Food Insecurity: No Food Insecurity (09/01/2022)   Hunger Vital Sign    Worried About Running Out of Food in the Last Year: Never true    Ran Out of Food in the Last Year: Never true  Transportation Needs: No Transportation Needs (09/01/2022)   PRAPARE - Administrator, Civil Service (Medical): No    Lack of Transportation (Non-Medical): No  Physical Activity: Not on file  Stress: Not on file  Social Connections: Not on file   Additional Social History:                         Sleep: Fair  Appetite:  Good  Current Medications: Current Facility-Administered Medications  Medication Dose Route Frequency Provider Last Rate Last Admin   acetaminophen (TYLENOL) tablet 650 mg  650 mg Oral Q6H PRN Onuoha, Chinwendu V, NP   650 mg at 09/04/22 2113   alum & mag hydroxide-simeth (MAALOX/MYLANTA) 200-200-20 MG/5ML suspension 30 mL  30 mL Oral  Q4H PRN Onuoha, Chinwendu V, NP   30 mL at 09/03/22 2236   clonazePAM (KLONOPIN) tablet 0.5 mg  0.5 mg Oral QHS Lewanda Rife, MD   0.5 mg at 09/04/22 2113   diphenhydrAMINE (BENADRYL) capsule 50 mg  50 mg Oral TID PRN Onuoha, Chinwendu V, NP   50 mg at 09/01/22 2324   Or   diphenhydrAMINE (BENADRYL) injection 50 mg  50 mg Intramuscular TID PRN Onuoha, Chinwendu V, NP       feeding supplement (ENSURE ENLIVE / ENSURE PLUS) liquid 237 mL  237 mL Oral BID BM Sarina Ill, DO   237 mL at 09/04/22 0826   FLUoxetine (PROZAC) capsule 20 mg  20 mg Oral Daily Onuoha, Chinwendu V, NP   20 mg at 09/04/22 0818   hydrOXYzine (ATARAX) tablet 25 mg  25 mg Oral TID PRN Onuoha, Chinwendu V, NP   25 mg at 09/03/22 1706   levothyroxine (SYNTHROID) tablet 50 mcg  50  mcg Oral Q0600 Lewanda Rife, MD   50 mcg at 09/04/22 0553   loratadine (CLARITIN) tablet 10 mg  10 mg Oral Daily Lewanda Rife, MD   10 mg at 09/04/22 0818   losartan (COZAAR) tablet 50 mg  50 mg Oral Daily Lewanda Rife, MD   50 mg at 09/04/22 0817   magnesium hydroxide (MILK OF MAGNESIA) suspension 30 mL  30 mL Oral Daily PRN Onuoha, Chinwendu V, NP       multivitamin with minerals tablet 1 tablet  1 tablet Oral Daily Sarina Ill, DO   1 tablet at 09/04/22 0817   [START ON 09/05/2022] pantoprazole (PROTONIX) EC tablet 80 mg  80 mg Oral BID AC Venora Maples, MD       pravastatin (PRAVACHOL) tablet 40 mg  40 mg Oral q1800 Lewanda Rife, MD   40 mg at 09/04/22 1716   white petrolatum (VASELINE) gel   Topical PRN Venora Maples, MD        Lab Results: No results found for this or any previous visit (from the past 48 hour(s)).  Blood Alcohol level:  Lab Results  Component Value Date   ETH <10 09/01/2022    Metabolic Disorder Labs: No results found for: "HGBA1C", "MPG" No results found for: "PROLACTIN" No results found for: "CHOL", "TRIG", "HDL", "CHOLHDL", "VLDL", "LDLCALC"   Musculoskeletal: Strength & Muscle Tone: within normal limits Gait & Station: normal Patient leans: N/A   Psychiatric Specialty Exam:   Presentation  General Appearance:  Appropriate for Environment; Casual   Eye Contact: Fair   Speech: Clear and Coherent   Speech Volume: Normal   Handedness: Right     Mood and Affect  Mood: Depressed;    Affect: Constricted     Thought Process  Thought Processes: Goal Directed   Descriptions of Associations:Intact   Orientation:Full (Time, Place and Person)   Thought Content: Improved   History of Schizophrenia/Schizoaffective disorder:No   Hallucinations:Hallucinations: None   Ideas of Reference:None   Suicidal Thoughts:Suicidal Thoughts:Passive   Homicidal Thoughts:Homicidal Thoughts: No      Sensorium  Memory: Immediate Good; Recent Good; Remote Good   Judgment: Impaired   Insight: Fair     Chartered certified accountant: Fair   Attention Span: Fair   Recall: Good   Fund of Knowledge: Good   Language: Good   Psychomotor Activity: Psychomotor Activity: Normal     Assets: Communication Skills; Desire for Improvement; Financial Resources/Insurance; Housing    Sleep: Sleep: Fair Number  of Hours of Sleep: 6    Physical Exam: Physical Exam Constitutional:      Appearance: Normal appearance.  HENT:     Head: Normocephalic and atraumatic.     Nose: Nose normal.  Eyes:     Pupils: Pupils are equal, round, and reactive to light.  Cardiovascular:     Rate and Rhythm: Normal rate.     Pulses: Normal pulses.  Pulmonary:     Effort: Pulmonary effort is normal.  Skin:    General: Skin is warm and dry.  Neurological:     General: No focal deficit present.     Mental Status: He is alert.      Review of Systems  Constitutional:  Positive for Headache HENT:  Negative for ear discharge and ear pain.   Eyes:  Negative for blurred vision and double vision.  Respiratory:  Negative for cough.   Cardiovascular:  Negative for chest pain and palpitations.  Gastrointestinal:  Negative for nausea and vomiting.  Neurological:  Negative for dizziness, speech change  Blood pressure (!) 141/78, pulse 73, temperature 97.7 F (36.5 C), resp. rate 18, height 6' (1.829 m), weight 83.9 kg, SpO2 98%. Body mass index is 25.09 kg/m.   Treatment Plan Summary: Daily contact with patient to assess and evaluate symptoms and progress in treatment and Medication management  Lewanda Rife, MD

## 2022-09-04 NOTE — Assessment & Plan Note (Signed)
Mildly above goal on losartan 50, consider increasing to 100 mg.

## 2022-09-04 NOTE — Assessment & Plan Note (Signed)
No symptoms nor imaging findings to suggest acute sinusitis at present.  Primary symptom from speaking with the patient is dryness of the naris.  Will discontinue his ipratropium nasal spray and recommended a holiday from [place] as well.  Apply Vaseline as needed to naris, also recommended nasal saline washes on a regular basis.

## 2022-09-04 NOTE — Group Note (Signed)
Recreation Therapy Group Note   Group Topic:Health and Wellness  Group Date: 09/04/2022 Start Time: 1400 End Time: 1445 Facilitators: Rosina Lowenstein, LRT, CTRS Location:  Day Room  Group Description: Seated Exercise. LRT discussed the mental and physical benefits of exercise. LRT and group discussed how physical activity can be used as a coping skill. Pt's and LRT followed along to an exercise video on the TV screen that provided a visual representation and audio description of every exercise performed. Pt's encouraged to listen to their bodies and stop at any time if they experience feelings of discomfort or pain. LRT passed out water after session was over and encouraged pts do drink and stay hydrated.  Goal Area(s) Addressed: Patient will learn benefits of physical activity. Patient will identify exercise as a coping skill.  Patient will follow multistep directions. Patient will try a new leisure interest.   Affect/Mood: Appropriate   Participation Level: Active and Engaged   Participation Quality: Independent   Behavior: Appropriate, Calm, and Cooperative   Speech/Thought Process: Coherent   Insight: Good   Judgement: Good   Modes of Intervention: Activity   Patient Response to Interventions:  Attentive, Engaged, Interested , and Receptive   Education Outcome:  Acknowledges education   Clinical Observations/Individualized Feedback: Ariana was active in their participation of session activities and group discussion. Pt completed all exercises appropriately while interacting well with LRT and peers duration of session.  Plan: Continue to engage patient in RT group sessions 2-3x/week.   Rosina Lowenstein, LRT, CTRS 09/04/2022 3:15 PM

## 2022-09-04 NOTE — Group Note (Signed)
Date:  09/04/2022 Time:  12:12 PM  Group Topic/Focus:  Rediscovering Joy:   The focus of this group is to explore various ways to relieve stress in a positive manner.    Participation Level:  Active  Participation Quality:  Appropriate  Affect:  Appropriate  Cognitive:  Alert and Appropriate  Insight: Appropriate  Engagement in Group:  Engaged  Modes of Intervention:  Activity and Discussion  Additional Comments:    Marta Antu 09/04/2022, 12:12 PM

## 2022-09-04 NOTE — BH Assessment (Signed)
Recreation Therapy Notes  INPATIENT RECREATION THERAPY ASSESSMENT  Patient Details Name: Mitchell Powell MRN: [id number] DOB: 1950-05-28 Today's Date: 09/04/2022       Information Obtained From: Patient (In addition to chart review)  Able to Participate in Assessment/Interview: Yes  Patient Presentation: Responsive, Alert, Oriented  Reason for Admission (Per Patient): Active Symptoms ("I had a lot going on in my head. Health issues, my stomach, enlarged prostrate, and a cold.")  Patient Stressors:  ("my health")  Coping Skills:   Loss adjuster, chartered, Exercise  Leisure Interests (2+):  Exercise - Walking, Sports - Golf  Frequency of Recreation/Participation: Weekly  Awareness of Community Resources:  Yes  Community Resources:  Financial trader course")  Current Use: Yes  Expressed Interest in [employer] Information: No  [employer] of Residence:  Designer, jewellery"  Patient Main Form of Transportation: Car  Patient Strengths:  "I am honest, nice, easy to get along with, and I like talking to people"  Patient Identified Areas of Improvement:  "I am in a rut. I do the same things over and over, I want to change my routine."  Patient Goal for Hospitalization:  "To feel better about myself"  Current SI (including self-harm):  No  Current HI:  No  Current AVH: No  Staff Intervention Plan: Group Attendance, Collaborate with Interdisciplinary Treatment Team  Consent to Intern Participation: N/A   Pt shared that he is going home Monday. Pt shared that he recently "got down on myself" and had a lot going on in his head. Pt shared that he struggles more with anxiety than depression. Pt shared that he would like to get a part time job or volunteer when he leaves the hospital because he stays to himself a lot of the time since his wife passed away. Pt was pleasant during assessment and had a bright affect.     LRT, CTRS   E  09/04/2022, 3:41  PM

## 2022-09-04 NOTE — Consult Note (Addendum)
Initial Consultation Note   Patient: Mitchell Powell WUJ:[id number] DOB: 1950/05/26 PCP: Mitchell Arbour, MD DOA: 09/01/2022 DOS: the patient was seen and examined on 09/04/2022 Primary service: Mitchell Powell  Referring physician: Dr. Marval Powell Reason for consult: abdominal discomfort, sinus pain  Assessment/Plan: Assessment and Plan: Dyspepsia Appears to have Powell mix of reflux, gastritis, and possible biliary colic symptoms.  Unfortunately esomeprazole not on formulary here at the hospital so cannot transition to that, we will plan to increase his PPI dose to address reflux and gastritis element.  Has prior evidence of cholelithiasis on imaging in the past as well as some symptoms, especially postprandial increase, that are suggestive of this etiology.  Will obtain Powell right upper quadrant ultrasound, if present then treatment would likely be conservative with dietary changes. - Follow-up results of right upper quadrant ultrasound - Increase pantoprazole to 80 mg twice daily - Consider changing diet order to low fat/gallbladder diet  Sinus pain No symptoms nor imaging findings to suggest acute sinusitis at present.  Primary symptom from speaking with the patient is dryness of the naris.  Will discontinue his ipratropium nasal spray and recommended Powell holiday from [place] as well.  Apply Vaseline as needed to naris, also recommended nasal saline washes on Powell regular basis.  Hypertension Mildly above goal on losartan 50, consider increasing to 100 mg.      TRH will continue to follow the patient.  HPI: Mitchell Powell is Powell 72 y.o. male with past medical history of anxiety and depression, HTN, AAA, popliteal and femoral artery aneurysm, GERD, COPD, who is currently admitted to the geriatric psychiatry unit with suicidal ideation. TRH asked to consult for several symptoms patient is reporting.  On my interview patient reports Powell discomfort like "bubbling" that he feels in his abdomen.  Comes and goes, does notice an increase after meals. Also associated with his known reflux. Has been ongoing for about Powell year. He has been taking probiotics which he thinks have helped. On review of chart he saw GI Dr. Servando Powell on 06/09/2022, at that time unclear why he had not been improving, plan had been to switch to esomeprazole and repeat EGD to evaluate. The last imaging he had of his abdomen was Powell CT Powell/P on 12/16/2018 which showed normal liver and small amount of cholelithiasis.   Patient also reports dryness and discomfort in his sinuses. Primarily bothered by the dryness. Has been using flonase for many years as well as iptratropium nasal spray. Denies use of Affrin. Does not have pain or nasal discharge, does not feel like he has an active sinus infection at present. Had Powell CT scan of his head recently on 08/26/2022 which showed mild mucosal thickening but no acute sinusitis.   Review of Systems: As mentioned in the history of present illness. All other systems reviewed and are negative. Past Medical History:  Diagnosis Date   Anxiety    Depression    GERD (gastroesophageal reflux disease)    Hyperlipidemia    Hypertension    Hypothyroidism    Past Surgical History:  Procedure Laterality Date   ABDOMINAL AORTIC ANEURYSM REPAIR     COLONOSCOPY WITH PROPOFOL N/Powell 05/10/2018   Procedure: COLONOSCOPY WITH PROPOFOL;  Surgeon: Mitchell Minium, MD;  Location: [facility] ENDOSCOPY;  Service: Endoscopy;  Laterality: N/Powell;   JOINT REPLACEMENT     TONSILLECTOMY AND ADENOIDECTOMY     Social History:  reports that he has never smoked. He has never been exposed to tobacco smoke.  He has never used smokeless tobacco. He reports current alcohol use. He reports that he does not use drugs.  Allergies  Allergen Reactions   Prednisone     Other reaction(s): Other (See Comments) irritiability    Family History  Problem Relation Age of Onset   Varicose Veins Father    Heart attack Brother    Diabetes Brother      Prior to Admission medications   Medication Sig Start Date End Date Taking? Authorizing Provider  benazepril (LOTENSIN) 20 MG tablet Take by mouth. Patient not taking: Reported on 09/01/2022 03/10/16   [provider]  buPROPion (WELLBUTRIN XL) 150 MG 24 hr tablet Take 150 mg by mouth daily. 08/12/22 08/12/23  [provider]  ciprofloxacin (CIPRO) 500 MG tablet Take 500 mg by mouth 2 (two) times daily. Patient not taking: Reported on 09/01/2022 08/24/22   [provider]  clonazePAM (KLONOPIN) 0.5 MG tablet Take by mouth daily as needed. 09/15/16   [provider]  esomeprazole (NEXIUM) 40 MG capsule Take 1 capsule (40 mg total) by mouth daily. Patient not taking: Reported on 09/01/2022 06/09/22   Mitchell Minium, MD  finasteride (PROSCAR) 5 MG tablet TAKE 1 TABLET EVERY DAY 05/29/22   Mitchell Cowboy A, PA-C  FLUoxetine (PROZAC) 10 MG capsule Take 10 mg by mouth daily. 08/20/22 08/20/23  [provider]  FLUoxetine (PROZAC) 20 MG capsule Take by mouth. 03/10/16   [provider]  FLUoxetine (PROZAC) 40 MG capsule Take 40 mg by mouth daily. Patient not taking: Reported on 09/01/2022 08/19/22   [provider]  fluticasone (FLONASE) 50 MCG/ACT nasal spray Place into the nose. 10/13/16   [provider]  HYDROcodone-acetaminophen (NORCO/VICODIN) 5-325 MG tablet Take 1 tablet by mouth every 4 (four) hours as needed. Patient not taking: Reported on 09/01/2022 06/10/22   [provider]  ipratropium (ATROVENT) 0.03 % nasal spray Place 2 sprays into both nostrils 2 (two) times daily. 07/17/22   [provider]  levothyroxine (SYNTHROID, LEVOTHROID) 50 MCG tablet Take 50 mcg by mouth daily before breakfast.    [provider]  loratadine (CLARITIN) 10 MG tablet Take by mouth.    [provider]  LORazepam (ATIVAN) 0.5 MG tablet Take 0.5 mg by mouth daily. 06/03/22   [provider]  losartan (COZAAR) 50 MG  tablet Take 50 mg by mouth daily. 07/08/21   [provider]  methocarbamol (ROBAXIN) 500 MG tablet Take 500 mg by mouth every 8 (eight) hours as needed. Patient not taking: Reported on 09/01/2022 06/09/22   [provider]  montelukast (SINGULAIR) 10 MG tablet Take 10 mg by mouth at bedtime. 08/19/22   [provider]  Omega-3 Fatty Acids (FISH OIL) 1000 MG CPDR Take by mouth.    [provider]  pantoprazole (PROTONIX) 40 MG tablet Take 40 mg by mouth 2 (two) times daily. 08/20/22   [provider]  pravastatin (PRAVACHOL) 40 MG tablet Take by mouth. 03/10/16   [provider]  tadalafil (CIALIS) 5 MG tablet Take 1 tablet (5 mg total) by mouth daily as needed for erectile dysfunction. 03/13/22   Harle Battiest, PA-C    Physical Exam: Vitals:   09/03/22 0711 09/03/22 1956 09/04/22 0720 09/04/22 1919  BP: (!) 147/76 124/81 (!) 143/65 (!) 141/78  Pulse: 74 (!) 59 79 73  Resp:   18 18  Temp: 98.2 F (36.8 C) 98.1 F (36.7 C) 98.2 F (36.8 C) 97.7 F (36.5 C)  TempSrc:      SpO2: 94% 99% 97% 98%  Weight:      Height:       Physical Exam Vitals reviewed.  Constitutional:      General: He is not in acute distress.    Appearance: Normal appearance. He is not Powell-appearing, toxic-appearing or diaphoretic.  HENT:     Nose:     Comments: Bilaterally with dry nasal mucosa, mildly erythematous    Mouth/Throat:     Mouth: Mucous membranes are moist.  Eyes:     General: No scleral icterus. Cardiovascular:     Rate and Rhythm: Normal rate and regular rhythm.     Heart sounds: Normal heart sounds.  Pulmonary:     Effort: Pulmonary effort is normal.     Breath sounds: Normal breath sounds.  Abdominal:     General: Abdomen is flat. Bowel sounds are normal.     Palpations: Abdomen is soft. There is no mass.     Tenderness: There is no abdominal tenderness. There is no guarding or rebound.  Musculoskeletal:     Right lower leg: No edema.      Left lower leg: No edema.  Skin:    General: Skin is warm and dry.  Neurological:     General: No focal deficit present.     Mental Status: He is alert.     Gait: Gait normal.  Psychiatric:        Behavior: Behavior normal.        Thought Content: Thought content normal.     Data Reviewed:  No results found for this or any previous visit (from the past 24 hour(s)).  CT HEAD WO CONTRAST ( ) CLINICAL DATA:  Dizziness  EXAM: CT HEAD WITHOUT CONTRAST  TECHNIQUE: Contiguous axial images were obtained from the base of the skull through the vertex without intravenous contrast.  RADIATION DOSE REDUCTION: This exam was performed according to the departmental dose-optimization program which includes automated exposure control, adjustment of the mA and/or kV according to patient size and/or use of iterative reconstruction technique.  COMPARISON:  CT brain 07/06/2018  FINDINGS: Brain: No acute territorial infarction, hemorrhage or intracranial mass. Mild atrophy. Nonenlarged ventricles  Vascular: No hyperdense vessels.  Carotid vascular calcification  Skull: Normal. Negative for fracture or focal lesion.  Sinuses/Orbits: Mild mucosal thickening in the sinuses  Other: None  IMPRESSION: No CT evidence for acute intracranial abnormality. Mild atrophy.  Electronically Signed   By: Jasmine Pang M.D.   On: 08/26/2022 17:58   There are no new results to review at this time.   Family Communication: None Primary team communication: Chart routed to consulting physician given late hour.  Thank you very much for involving [place] in the care of your patient.  Author: Venora Maples, MD 09/04/2022 11:55 PM  For on call review [email or web address].

## 2022-09-04 NOTE — Assessment & Plan Note (Signed)
Appears to have a mix of reflux, gastritis, and possible biliary colic symptoms.  Unfortunately esomeprazole not on formulary here at the hospital so cannot transition to that, we will plan to increase his PPI dose to address reflux and gastritis element.  Has prior evidence of cholelithiasis on imaging in the past as well as some symptoms, especially postprandial increase, that are suggestive of this etiology.  Will obtain a right upper quadrant ultrasound, if present then treatment would likely be conservative with dietary changes. - Follow-up results of right upper quadrant ultrasound - Increase pantoprazole to 80 mg twice daily - Consider changing diet order to low fat/gallbladder diet

## 2022-09-04 NOTE — Plan of Care (Signed)
  Problem: Education: Goal: Knowledge of General Education information will improve Description: Including pain rating scale, medication(s)/side effects and non-pharmacologic comfort measures Outcome: Progressing   Problem: Coping: Goal: Coping ability will improve Outcome: Progressing   Problem: Health Behavior/Discharge Planning: Goal: Compliance with therapeutic regimen will improve Outcome: Progressing   Problem: Self-Concept: Goal: Level of anxiety will decrease Outcome: Progressing

## 2022-09-04 NOTE — Plan of Care (Signed)
  Problem: Anxiety Goal: STG - Patient will identify 3 benefits of managing their anxiety in a healthy manner within 5 recreation therapy group sessions Description: STG - Patient will identify 3 benefits of managing their anxiety in a healthy manner within 5 recreation therapy group sessions Outcome: Not Applicable

## 2022-09-04 NOTE — Progress Notes (Signed)
Patient pleasant, but anxious this morning.  Patient continues to complain of stomach pain and acid reflux.  He is requesting probiotics.  Endorses anxiety, but states it is better than yesterday.  Denies SI, HI and AVH.   Present in the dayroom for breakfast.  Appropriate interaction with peers. Compliant with scheduled medications.  15 min checks in place for safety.  Patient often paces in the hallways.   Patient requesting probiotics to help with stomach pain.  Secure chat sent to provider.

## 2022-09-05 ENCOUNTER — Inpatient Hospital Stay: Payer: Medicare HMO

## 2022-09-05 DIAGNOSIS — F4323 Adjustment disorder with mixed anxiety and depressed mood: Principal | ICD-10-CM

## 2022-09-05 LAB — CBC WITH DIFFERENTIAL/PLATELET
Abs Immature Granulocytes: 0.03 10*3/uL (ref 0.00–0.07)
Basophils Absolute: 0 10*3/uL (ref 0.0–0.1)
Basophils Relative: 1 %
Eosinophils Absolute: 0.1 10*3/uL (ref 0.0–0.5)
Eosinophils Relative: 1 %
HCT: 42.6 % (ref 39.0–52.0)
Hemoglobin: 14.3 g/dL (ref 13.0–17.0)
Immature Granulocytes: 0 %
Lymphocytes Relative: 19 %
Lymphs Abs: 1.5 10*3/uL (ref 0.7–4.0)
MCH: 32.6 pg (ref 26.0–34.0)
MCHC: 33.6 g/dL (ref 30.0–36.0)
MCV: 97.3 fL (ref 80.0–100.0)
Monocytes Absolute: 0.6 10*3/uL (ref 0.1–1.0)
Monocytes Relative: 7 %
Neutro Abs: 5.7 10*3/uL (ref 1.7–7.7)
Neutrophils Relative %: 72 %
Platelets: 236 10*3/uL (ref 150–400)
RBC: 4.38 MIL/uL (ref 4.22–5.81)
RDW: 13.2 % (ref 11.5–15.5)
WBC: 7.9 10*3/uL (ref 4.0–10.5)
nRBC: 0 % (ref 0.0–0.2)

## 2022-09-05 LAB — COMPREHENSIVE METABOLIC PANEL
ALT: 14 U/L (ref 0–44)
AST: 17 U/L (ref 15–41)
Albumin: 3.6 g/dL (ref 3.5–5.0)
Alkaline Phosphatase: 64 U/L (ref 38–126)
Anion gap: 8 (ref 5–15)
BUN: 21 mg/dL (ref 8–23)
CO2: 24 mmol/L (ref 22–32)
Calcium: 8.8 mg/dL — ABNORMAL LOW (ref 8.9–10.3)
Chloride: 107 mmol/L (ref 98–111)
Creatinine, Ser: 0.92 mg/dL (ref 0.61–1.24)
GFR, Estimated: >60 mL/min (ref >60)
Glucose, Bld: 118 mg/dL — ABNORMAL HIGH (ref 70–99)
Potassium: 3.9 mmol/L (ref 3.5–5.1)
Sodium: 139 mmol/L (ref 135–145)
Total Bilirubin: 0.5 mg/dL (ref 0.3–1.2)
Total Protein: 6.7 g/dL (ref 6.5–8.1)

## 2022-09-05 MED ORDER — FAMOTIDINE 20 MG PO TABS
20.0000 mg | ORAL_TABLET | Freq: Two times a day (BID) | ORAL | Status: DC
  Administered 2022-09-05 – 2022-09-07 (×5): 20 mg via ORAL
  Filled 2022-09-05 (×5): qty 1

## 2022-09-05 MED ORDER — RISAQUAD PO CAPS
1.0000 | ORAL_CAPSULE | Freq: Every day | ORAL | Status: DC
  Administered 2022-09-05 – 2022-09-07 (×3): 1 via ORAL
  Filled 2022-09-05 (×3): qty 1

## 2022-09-05 NOTE — Group Note (Signed)
Date:  09/05/2022 Time:  3:04 PM  Group Topic/Focus:  Coping With Mental Health Crisis:   The purpose of this group is to help patients identify strategies for coping with mental health crisis.  Group discusses possible causes of crisis and ways to manage them effectively. Rediscovering Joy:   The focus of this group is to explore various ways to relieve stress in a positive manner.  We went outside for fresh air and relaxation!    Participation Level:  Active  Participation Quality:  Appropriate  Affect:  Appropriate  Cognitive:  Appropriate  Insight: Appropriate  Engagement in Group:  Engaged  Modes of Intervention:  Discussion  Additional Comments:      L  09/05/2022, 3:04 PM

## 2022-09-05 NOTE — Progress Notes (Signed)
   09/05/22 0600  15 Minute Checks  Location Bedroom  Visual Appearance Calm  Behavior Sleeping  Sleep (Behavioral Health Patients Only)  Calculate sleep? (Click Yes once per 24 hr at 0600 safety check) Yes  Documented sleep last 24 hours 8    

## 2022-09-05 NOTE — Progress Notes (Signed)
   09/05/22 2200  Psych Admission Type (Psych Patients Only)  Admission Status Voluntary  Psychosocial Assessment  Patient Complaints Malaise  Eye Contact Fair  Facial Expression Anxious  Affect Anxious  Speech Logical/coherent  Interaction Assertive  Motor Activity Other (Comment) (unremarkable)  Appearance/Hygiene Unremarkable  Behavior Characteristics Cooperative  Mood Anxious;Pleasant  Thought Process  Coherency WDL  Content WDL  Delusions None reported or observed  Perception WDL  Hallucination None reported or observed  Judgment WDL  Confusion None  Danger to Self  Current suicidal ideation? Denies  Danger to Others  Danger to Others None reported or observed

## 2022-09-05 NOTE — Progress Notes (Signed)
Patient transported to receive a RUQ ultrasound.

## 2022-09-05 NOTE — Progress Notes (Signed)
Patient at the nursing station, reporting that he feels dizzy. Patient instructed to take a seat and this RN will take his vitals. Vitals obtained and placed in chart. Patient given nourishment and hydration. Patient assisted with ambulation back to his bedroom.

## 2022-09-05 NOTE — Progress Notes (Signed)
   09/04/22 2300  Psych Admission Type (Psych Patients Only)  Admission Status Voluntary  Psychosocial Assessment  Patient Complaints Anxiety  Eye Contact Fair  Facial Expression Anxious  Affect Anxious  Speech Logical/coherent  Interaction Assertive  Motor Activity Other (Comment) (unremarkable)  Appearance/Hygiene Unremarkable  Behavior Characteristics Cooperative  Mood Anxious;Pleasant  Thought Process  Coherency WDL  Content WDL  Delusions None reported or observed  Perception WDL  Hallucination None reported or observed  Judgment WDL  Confusion None  Danger to Self  Current suicidal ideation? Denies  Danger to Others  Danger to Others None reported or observed

## 2022-09-05 NOTE — Progress Notes (Signed)
[facility] MD Progress Note  09/05/2022 3:33 PM Mitchell Powell  MRN:  [id number]  Subjective: Patient seen during a.m. rounds, chart reviewed, and case discussed with staff.  Patient was evaluated by hospitalist for medical needs.  Their input is appreciated.  Patient is doing fine on the unit.  No acute events reported.  He is complaining of dry nose.  Will give patient Vaseline as recommended by Dr.Sreenath.  Patient has been attending group and working on coping strategies.  He continues to deny any thoughts of harming himself or others.  He does claims that at times he feels Sad and lonely.  Patient was provided with support.  He was encouraged to work on safe discharge planning anticipated discharge this upcoming Monday  Principal Problem: Adjustment disorder with mixed anxiety and depressed mood Diagnosis: Principal Problem:   Adjustment disorder with mixed anxiety and depressed mood Active Problems:   Hypertension   Dyspepsia   Sinus pain   Past Medical History:  Past Medical History:  Diagnosis Date   Anxiety    Depression    GERD (gastroesophageal reflux disease)    Hyperlipidemia    Hypertension    Hypothyroidism     Past Surgical History:  Procedure Laterality Date   ABDOMINAL AORTIC ANEURYSM REPAIR     COLONOSCOPY WITH PROPOFOL N/A 05/10/2018   Procedure: COLONOSCOPY WITH PROPOFOL;  Surgeon: Midge Minium, MD;  Location: [facility] ENDOSCOPY;  Service: Endoscopy;  Laterality: N/A;   JOINT REPLACEMENT     TONSILLECTOMY AND ADENOIDECTOMY     Family History:  Family History  Problem Relation Age of Onset   Varicose Veins Father    Heart attack Brother    Diabetes Brother     Social History:  Social History   Substance and Sexual Activity  Alcohol Use Yes     Social History   Substance and Sexual Activity  Drug Use No    Social History   Socioeconomic History   Marital status: Widowed    Spouse name: Not on file   Number of children: Not on file   Years of  education: Not on file   Highest education level: Not on file  Occupational History   Not on file  Tobacco Use   Smoking status: Never    Passive exposure: Never   Smokeless tobacco: Never  Substance and Sexual Activity   Alcohol use: Yes   Drug use: No   Sexual activity: Not on file  Other Topics Concern   Not on file  Social History Narrative   Not on file   Social Determinants of Health   Financial Resource Strain: Low Risk  (07/16/2022)   [facility]   Overall Financial Resource Strain (CARDIA)    Difficulty of Paying Living Expenses: Not hard at all  Food Insecurity: No Food Insecurity (09/01/2022)   Hunger Vital Sign    Worried About Running Out of Food in the Last Year: Never true    Ran Out of Food in the Last Year: Never true  Transportation Needs: No Transportation Needs (09/01/2022)   PRAPARE - Administrator, Civil Service (Medical): No    Lack of Transportation (Non-Medical): No  Physical Activity: Not on file  Stress: Not on file  Social Connections: Not on file   Additional Social History:  Sleep: Fair  Appetite:  Good  Current Medications: Current Facility-Administered Medications  Medication Dose Route Frequency Provider Last Rate Last Admin   acetaminophen (TYLENOL) tablet 650 mg  650 mg Oral Q6H PRN Onuoha, Chinwendu V, NP   650 mg at 09/04/22 2113   acidophilus (RISAQUAD) capsule 1 capsule  1 capsule Oral Daily Lolita Patella B, MD   1 capsule at 09/05/22 0919   alum & mag hydroxide-simeth (MAALOX/MYLANTA) 200-200-20 MG/5ML suspension 30 mL  30 mL Oral Q4H PRN Onuoha, Chinwendu V, NP   30 mL at 09/03/22 2236   clonazePAM (KLONOPIN) tablet 0.5 mg  0.5 mg Oral QHS Lewanda Rife, MD   0.5 mg at 09/04/22 2113   diphenhydrAMINE (BENADRYL) capsule 50 mg  50 mg Oral TID PRN Onuoha, Chinwendu V, NP   50 mg at 09/01/22 2324   Or   diphenhydrAMINE  (BENADRYL) injection 50 mg  50 mg Intramuscular TID PRN Onuoha, Chinwendu V, NP       famotidine (PEPCID) tablet 20 mg  20 mg Oral BID Georgeann Oppenheim, Sudheer B, MD   20 mg at 09/05/22 0920   feeding supplement (ENSURE ENLIVE / ENSURE PLUS) liquid 237 mL  237 mL Oral BID BM Sarina Ill, DO   237 mL at 09/04/22 0826   FLUoxetine (PROZAC) capsule 20 mg  20 mg Oral Daily Onuoha, Chinwendu V, NP   20 mg at 09/05/22 0919   hydrOXYzine (ATARAX) tablet 25 mg  25 mg Oral TID PRN Onuoha, Chinwendu V, NP   25 mg at 09/03/22 1706   levothyroxine (SYNTHROID) tablet 50 mcg  50 mcg Oral Q0600 Lewanda Rife, MD   50 mcg at 09/05/22 0634   loratadine (CLARITIN) tablet 10 mg  10 mg Oral Daily Lewanda Rife, MD   10 mg at 09/05/22 0920   losartan (COZAAR) tablet 50 mg  50 mg Oral Daily Lewanda Rife, MD   50 mg at 09/05/22 0919   magnesium hydroxide (MILK OF MAGNESIA) suspension 30 mL  30 mL Oral Daily PRN Onuoha, Chinwendu V, NP       multivitamin with minerals tablet 1 tablet  1 tablet Oral Daily Sarina Ill, DO   1 tablet at 09/05/22 0920   pantoprazole (PROTONIX) EC tablet 80 mg  80 mg Oral BID AC Venora Maples, MD   80 mg at 09/05/22 0919   pravastatin (PRAVACHOL) tablet 40 mg  40 mg Oral q1800 Lewanda Rife, MD   40 mg at 09/04/22 1716   white petrolatum (VASELINE) gel   Topical PRN Venora Maples, MD        Lab Results:  Results for orders placed or performed during the hospital encounter of 09/01/22 (from the past 48 hour(s))  CBC with Differential/Platelet     Status: None   Collection Time: 09/05/22  3:10 PM  Result Value Ref Range   WBC 7.9 4.0 - 10.5 K/uL   RBC 4.38 4.22 - 5.81 MIL/uL   Hemoglobin 14.3 13.0 - 17.0 g/dL   HCT 16.1 09.6 - 04.5 %   MCV 97.3 80.0 - 100.0 fL   MCH 32.6 26.0 - 34.0 pg   MCHC 33.6 30.0 - 36.0 g/dL   RDW 40.9 81.1 - 91.4 %   Platelets 236 150 - 400 K/uL   nRBC 0.0 0.0 - 0.2 %   Neutrophils Relative % 72 %   Neutro Abs 5.7  1.7 - 7.7 K/uL   Lymphocytes Relative 19 %   Lymphs Abs  1.5 0.7 - 4.0 K/uL   Monocytes Relative 7 %   Monocytes Absolute 0.6 0.1 - 1.0 K/uL   Eosinophils Relative 1 %   Eosinophils Absolute 0.1 0.0 - 0.5 K/uL   Basophils Relative 1 %   Basophils Absolute 0.0 0.0 - 0.1 K/uL   Immature Granulocytes 0 %   Abs Immature Granulocytes 0.03 0.00 - 0.07 K/uL    Comment: Performed at [facility], [address] [place], [place] [address]  Comprehensive metabolic panel     Status: Abnormal   Collection Time: 09/05/22  3:10 PM  Result Value Ref Range   Sodium 139 135 - 145 mmol/L   Potassium 3.9 3.5 - 5.1 mmol/L   Chloride 107 98 - 111 mmol/L   CO2 24 22 - 32 mmol/L   Glucose, Bld 118 (H) 70 - 99 mg/dL    Comment: Glucose reference range applies only to samples taken after fasting for at least 8 hours.   BUN 21 8 - 23 mg/dL   Creatinine, Ser 6.04 0.61 - 1.24 mg/dL   Calcium 8.8 (L) 8.9 - 10.3 mg/dL   Total Protein 6.7 6.5 - 8.1 g/dL   Albumin 3.6 3.5 - 5.0 g/dL   AST 17 15 - 41 U/L   ALT 14 0 - 44 U/L   Alkaline Phosphatase 64 38 - 126 U/L   Total Bilirubin 0.5 0.3 - 1.2 mg/dL   GFR, Estimated >54 >09 mL/min    Comment: (NOTE) Calculated using the CKD-EPI Creatinine Equation (2021)    Anion gap 8 5 - 15    Comment: Performed at [facility], [address] [place], [place] [address]    Blood Alcohol level:  Lab Results  Component Value Date   [facility] <10 09/01/2022    Metabolic Disorder Labs: No results found for: "HGBA1C", "MPG" No results found for: "PROLACTIN" No results found for: "CHOL", "TRIG", "HDL", "CHOLHDL", "VLDL", "LDLCALC"   Musculoskeletal: Strength & Muscle Tone: within normal limits Gait & Station: normal Patient leans: N/A   Psychiatric Specialty Exam:   Presentation  General Appearance:  Appropriate for Environment; Casual   Eye Contact: Fair   Speech: Clear and Coherent   Speech Volume: Normal   Handedness: Right      Mood and Affect  Mood: Depressed;    Affect: Constricted     Thought Process  Thought Processes: Goal Directed   Descriptions of Associations:Intact   Orientation:Full (Time, Place and Person)   Thought Content: Improved   History of Schizophrenia/Schizoaffective disorder:No   Hallucinations:Hallucinations: None   Ideas of Reference:None   Suicidal Thoughts:Suicidal Thoughts:Denies   Homicidal Thoughts:Homicidal Thoughts: No     Sensorium  Memory: Immediate Good; Recent Good; Remote Good   Judgment: Impaired   Insight: Fair     Chartered certified accountant: Fair   Attention Span: Fair   Recall: Good   Fund of Knowledge: Good   Language: Good   Psychomotor Activity: Psychomotor Activity: Normal     Assets: Communication Skills; Desire for Improvement; Financial Resources/Insurance; Housing    Sleep: Sleep: Fair Number of Hours of Sleep: 6    Physical Exam: Physical Exam Constitutional:      Appearance: Normal appearance.  HENT:     Head: Normocephalic and atraumatic.     Nose: Nose normal.  Eyes:     Pupils: Pupils are equal, round, and reactive to light.  Cardiovascular:     Rate and Rhythm: Normal rate.     Pulses:  Normal pulses.  Pulmonary:     Effort: Pulmonary effort is normal.  Skin:    General: Skin is warm and dry.  Neurological:     General: No focal deficit present.     Mental Status: He is alert.      Review of Systems  Constitutional:  Positive for Headache HENT:  Negative for ear discharge and ear pain.   Eyes:  Negative for blurred vision and double vision.  Respiratory:  Negative for cough.   Cardiovascular:  Negative for chest pain and palpitations.  Gastrointestinal:  Negative for nausea and vomiting.  Neurological:  Negative for dizziness, speech change  Blood pressure (!) 145/81, pulse (!) 59, temperature (!) 97.2 F (36.2 C), resp. rate 20, height 6' (1.829 m), weight 83.9 kg, SpO2 100%.  Body mass index is 25.09 kg/m.   Treatment Plan Summary: Daily contact with patient to assess and evaluate symptoms and progress in treatment and Medication management  Lewanda Rife, MD

## 2022-09-05 NOTE — Group Note (Signed)
Date:  09/05/2022 Time:  12:28 PM  Group Topic/Focus:  Coping With Mental Health Crisis:   The purpose of this group is to help patients identify strategies for coping with mental health crisis.  Group discusses possible causes of crisis and ways to manage them effectively.    Participation Level:  Active  Participation Quality:  Appropriate  Affect:  Appropriate  Cognitive:  Appropriate  Insight: Appropriate  Engagement in Group:  Engaged  Modes of Intervention:  Discussion  Additional Comments:      L  09/05/2022, 12:28 PM

## 2022-09-05 NOTE — Plan of Care (Signed)
  Problem: Education: Goal: Knowledge of General Education information will improve Description: Including pain rating scale, medication(s)/side effects and non-pharmacologic comfort measures Outcome: Progressing   Problem: Health Behavior/Discharge Planning: Goal: Ability to manage health-related needs will improve Outcome: Progressing   Problem: Activity: Goal: Risk for activity intolerance will decrease Outcome: Progressing   

## 2022-09-05 NOTE — Progress Notes (Signed)
   09/05/22 1500  Psych Admission Type (Psych Patients Only)  Admission Status Voluntary  Psychosocial Assessment  Patient Complaints Other (Comment) (abd pain)  Eye Contact Fair  Facial Expression Anxious  Affect Appropriate to circumstance  Speech Logical/coherent  Interaction Assertive  Motor Activity Other (Comment) (WNL)  Appearance/Hygiene Unremarkable  Behavior Characteristics Cooperative  Mood Pleasant  Thought Process  Coherency WDL  Content WDL  Delusions None reported or observed  Perception WDL  Hallucination None reported or observed  Judgment WDL  Confusion None  Danger to Self  Current suicidal ideation? Denies  Danger to Others  Danger to Others None reported or observed

## 2022-09-05 NOTE — Group Note (Signed)
Date:  09/05/2022 Time:  8:18 PM  Group Topic/Focus:  Conflict Resolution:   The focus of this group is to discuss the conflict resolution process and how it may be used upon discharge.    Participation Level:  Active  Participation Quality:  Appropriate  Affect:  Appropriate  Cognitive:  Appropriate  Insight: Appropriate  Engagement in Group:  Engaged  Modes of Intervention:  Education  Additional Comments:    Jeannette How 09/05/2022, 8:18 PM

## 2022-09-05 NOTE — Progress Notes (Addendum)
Brief hospitalist progress note.  Full note to follow.  [facility] hospitalist service contacted for chief complaint of nonspecific abdominal pain.  Per chart review patient has a known history of severe reflux and gastritis.  PPI dose was increased to 80 mg twice daily.  Right upper quadrant ultrasound ordered upon initial consultation.  Recommendations: Will follow right upper quadrant ultrasound when completed and comment on results.  In the meantime will check CBC and CMP.  Continue Protonix 80 mg twice daily.  Add Pepcid 20 mg p.o. twice daily.  Add probiotics if not already.  Further recommendations pending right upper quadrant ultrasound.  Please reach out with any questions/concerns.  Lolita Patella MD   [facility] hospitalists No charge

## 2022-09-05 NOTE — Progress Notes (Signed)
PROGRESS NOTE    Mitchell Powell  WUJ:[id number] DOB: Feb 13, 1951 DOA: 09/01/2022 PCP: Marguarite Arbour, MD    Brief Narrative:  72 y.o. male with past medical history of anxiety and depression, HTN, AAA, popliteal and femoral artery aneurysm, GERD, COPD, who is currently admitted to the geriatric psychiatry unit with suicidal ideation. [facility] asked to consult for several symptoms patient is reporting.   On my interview patient reports a discomfort like "bubbling" that he feels in his abdomen. Comes and goes, does notice an increase after meals. Also associated with his known reflux. Has been ongoing for about a year. He has been taking probiotics which he thinks have helped. On review of chart he saw GI Dr. Servando Snare on 06/09/2022, at that time unclear why he had not been improving, plan had been to switch to esomeprazole and repeat EGD to evaluate. The last imaging he had of his abdomen was a CT A/P on 12/16/2018 which showed normal liver and small amount of cholelithiasis.    Patient also reports dryness and discomfort in his sinuses. Primarily bothered by the dryness. Has been using flonase for many years as well as iptratropium nasal spray. Denies use of Affrin. Does not have pain or nasal discharge, does not feel like he has an active sinus infection at present. Had a CT scan of his head recently on 08/26/2022 which showed mild mucosal thickening but no acute sinusitis.    Assessment & Plan:   Principal Problem:   Adjustment disorder with mixed anxiety and depressed mood Active Problems:   Hypertension   Dyspepsia   Sinus pain  Dyspepsia Appears to have a mix of reflux, gastritis, and possible biliary colic symptoms.   Appears to be improving with multimodal reflux regimen Right upper quadrant ultrasound reassuring Recommendations: Continue high-dose twice daily PPI Add Pepcid 20 mg p.o. twice daily Add probiotic As needed Mylanta for indigestion/gastritis symptoms CBC/CMP.  Ordered today.   Not drawn. Will follow-up in a.m.   Sinus pain No symptoms nor imaging findings to suggest acute sinusitis at present.   Primary symptom from speaking with the patient is dryness of the naris.   Will discontinue his ipratropium nasal spray and recommended a holiday from [place] as well.   Apply Vaseline as needed to nares, also recommended nasal saline washes on a regular basis.   Hypertension Mildly above goal on losartan 50, consider increasing to 100 mg.  TRH service will continue to follow patient.  Will evaluate on 7/14.  Please draw ordered labs.  If labs reassuring and symptoms improve plan to sign off 7/14.     DVT prophylaxis: per primary team Code Status: FULL Family Communication:NA Disposition Plan: per primary Level of care: Gero-psych  Consultants:  Hospitalist  Procedures:  None  Antimicrobials: None    Subjective: Seen and examined.  Reports improvement in abdominal pain symptoms.  Nonspecific description.  States belly feels "empty" denies crampy pain.  Denies changes in bowel habits. Objective: Vitals:   09/03/22 1956 09/04/22 0720 09/04/22 1919 09/05/22 0714  BP: 124/81 (!) 143/65 (!) 141/78 (!) 145/81  Pulse: (!) 59 79 73 (!) 59  Resp:  18 18 20   Temp: 98.1 F (36.7 C) 98.2 F (36.8 C) 97.7 F (36.5 C) (!) 97.2 F (36.2 C)  TempSrc:      SpO2: 99% 97% 98% 100%  Weight:      Height:       No intake or output data in the 24 hours ending 09/05/22  1335 Filed Weights   09/01/22 2300  Weight: 83.9 kg    Examination:  General exam: Appears calm and comfortable  Respiratory system: Clear to auscultation. Respiratory effort normal. Cardiovascular system: S1-S2, RRR, no murmurs, no pedal edema Gastrointestinal system: Soft, NT/ND, normal bowel sounds Central nervous system: Alert and oriented. No focal neurological deficits. Extremities: Symmetric 5 x 5 power. Skin: No rashes, lesions or ulcers Psychiatry: Judgement and insight appear normal.  Mood & affect appropriate.     Data Reviewed: I have personally reviewed following labs and imaging studies  CBC: Recent Labs  Lab 09/01/22 1302  WBC 6.8  HGB 14.7  HCT 44.7  MCV 97.4  PLT 242   Basic Metabolic Panel: Recent Labs  Lab 09/01/22 1302  NA 134*  K 3.8  CL 101  CO2 24  GLUCOSE 100*  BUN 15  CREATININE 0.83  CALCIUM 9.5   GFR: Estimated Creatinine Clearance: 88.3 mL/min (by C-G formula based on SCr of 0.83 mg/dL). Liver Function Tests: Recent Labs  Lab 09/01/22 1302  AST 15  ALT 14  ALKPHOS 62  BILITOT 0.5  PROT 7.4  ALBUMIN 4.8   No results for input(s): "LIPASE", "AMYLASE" in the last 168 hours. No results for input(s): "AMMONIA" in the last 168 hours. Coagulation Profile: No results for input(s): "INR", "PROTIME" in the last 168 hours. Cardiac Enzymes: No results for input(s): "CKTOTAL", "CKMB", "CKMBINDEX", "TROPONINI" in the last 168 hours. BNP (last 3 results) No results for input(s): "PROBNP" in the last 8760 hours. HbA1C: No results for input(s): "HGBA1C" in the last 72 hours. CBG: No results for input(s): "GLUCAP" in the last 168 hours. Lipid Profile: No results for input(s): "CHOL", "HDL", "LDLCALC", "TRIG", "CHOLHDL", "LDLDIRECT" in the last 72 hours. Thyroid Function Tests: No results for input(s): "TSH", "T4TOTAL", "FREET4", "T3FREE", "THYROIDAB" in the last 72 hours. Anemia Panel: No results for input(s): "VITAMINB12", "FOLATE", "FERRITIN", "TIBC", "IRON", "RETICCTPCT" in the last 72 hours. Sepsis Labs: No results for input(s): "PROCALCITON", "LATICACIDVEN" in the last 168 hours.  No results found for this or any previous visit (from the past 240 hour(s)).       Radiology Studies: US Abdomen Limited RUQ (LIVER/GB)  Result Date: 09/05/2022 CLINICAL DATA:  Indigestion. EXAM: ULTRASOUND ABDOMEN LIMITED RIGHT UPPER QUADRANT COMPARISON:  CT abdomen 12/16/2018 and abdominal ultrasound 01/11/2015 FINDINGS: Gallbladder: Mild  distention of the gallbladder without wall thickening. No sonographic Murphy sign. Evidence for multiple small echogenic gallbladder polyps. Largest measuring 0.5 cm. Multiple small polyps noted on the previous examination. No evidence for gallstones. Common bile duct: Diameter: 0.4 cm. Liver: No focal lesion identified. Slightly increased echogenicity in the liver. Limited evaluation of the left hepatic lobe due to bowel gas. No intra hepatic biliary dilatation. Portal vein is patent on color Doppler imaging with normal direction of blood flow towards the liver. Other: None. IMPRESSION: 1. No acute abnormality in the right upper quadrant. 2. Multiple small gallbladder polyps. Largest measuring 0.5 cm. Similar finding on the previous ultrasound examination and no dedicated follow-up needed. Electronically Signed   By: Richarda Overlie M.D.   On: 09/05/2022 12:57        Scheduled Meds:  acidophilus  1 capsule Oral Daily   clonazePAM  0.5 mg Oral QHS   famotidine  20 mg Oral BID   feeding supplement  237 mL Oral BID BM   FLUoxetine  20 mg Oral Daily   levothyroxine  50 mcg Oral Q0600   loratadine  10 mg Oral  Daily   losartan  50 mg Oral Daily   multivitamin with minerals  1 tablet Oral Daily   pantoprazole  80 mg Oral BID AC   pravastatin  40 mg Oral q1800   Continuous Infusions:   LOS: 4 days    Tresa Moore, MD [facility]   If 7PM-7AM, please contact night-coverage  09/05/2022, 1:35 PM

## 2022-09-06 LAB — URINALYSIS, ROUTINE W REFLEX MICROSCOPIC
Bilirubin Urine: NEGATIVE
Glucose, UA: NEGATIVE mg/dL
Hgb urine dipstick: NEGATIVE
Ketones, ur: NEGATIVE mg/dL
Leukocytes,Ua: NEGATIVE
Nitrite: NEGATIVE
Protein, ur: NEGATIVE mg/dL
Specific Gravity, Urine: 1.004 — ABNORMAL LOW (ref 1.005–1.030)
pH: 6 (ref 5.0–8.0)

## 2022-09-06 MED ORDER — SIMETHICONE 80 MG PO CHEW
80.0000 mg | CHEWABLE_TABLET | Freq: Four times a day (QID) | ORAL | Status: DC
  Administered 2022-09-06 – 2022-09-07 (×3): 80 mg via ORAL
  Filled 2022-09-06 (×6): qty 1

## 2022-09-06 NOTE — Group Note (Signed)
Date:  09/06/2022 Time:  11:25 AM  Group Topic/Focus:  Coping With Mental Health Crisis:   The purpose of this group is to help patients identify strategies for coping with mental health crisis.  Group discusses possible causes of crisis and ways to manage them effectively. Healthy Communication:   The focus of this group is to discuss communication, barriers to communication, as well as healthy ways to communicate with others. Self Care:   The focus of this group is to help patients understand the importance of self-care in order to improve or restore emotional, physical, spiritual, interpersonal, and financial health.    Participation Level:  Did Not Attend  Participation Quality:    Affect:    Cognitive:    Insight:   Engagement in Group:    Modes of Intervention:    Additional Comments:     L  09/06/2022, 11:25 AM

## 2022-09-06 NOTE — Group Note (Signed)
LCSW Group Therapy Note  Group Date: 09/06/2022 Start Time: 1255 End Time: 1330   Type of Therapy and Topic:  Group Therapy - Healthy vs Unhealthy Coping Skills  Participation Level:  Active   Description of Group The focus of this group was to determine what unhealthy coping techniques typically are used by group members and what healthy coping techniques would be helpful in coping with various problems. Patients were guided in becoming aware of the differences between healthy and unhealthy coping techniques. Patients were asked to identify 2-3 healthy coping skills they would like to learn to use more effectively.  Therapeutic Goals Patients learned that coping is what human beings do all day long to deal with various situations in their lives Patients defined and discussed healthy vs unhealthy coping techniques Patients identified their preferred coping techniques and identified whether these were healthy or unhealthy Patients determined 2-3 healthy coping skills they would like to become more familiar with and use more often. Patients provided support and ideas to each other   Summary of Patient Progress:  Patient attended group. During group, Govan expressed using talking to a friend as a coping mechanism. Patient proved open to input from peers and feedback from CSW. Patient demonstrated good insight into the subject matter, was respectful of peers, and participated throughout the entire session.   Therapeutic Modalities Cognitive Behavioral Therapy Motivational Interviewing  Tama Headings 09/06/2022  2:32 PM

## 2022-09-06 NOTE — Progress Notes (Signed)
[facility] MD Progress Note  09/06/2022 11:57 AM Mitchell Powell  MRN:  [id number]  Subjective: Patient seen during a.m. rounds, chart reviewed, and case discussed with staff.  Patient was evaluated by hospitalist for medical needs.  Their input is appreciated.   Patient patient reports anxious mood today.  He said he is struggling with his obsessive thoughts.  Patient was encouraged to consider CBT  upon discharge.  Patient had questions about CBT which were answered to his satisfaction.  .  No acute events reported. Patient has been attending group and working on coping strategies.  He continues to deny any thoughts of harming himself or others. He was encouraged to work on safe discharge planning anticipated discharge tomorrow.  Patient reports he is excited to go home tomorrow.  He denies any intention or plan to harm himself or others.  Principal Problem: Adjustment disorder with mixed anxiety and depressed mood Diagnosis: Principal Problem:   Adjustment disorder with mixed anxiety and depressed mood Active Problems:   Hypertension   Dyspepsia   Sinus pain   Past Medical History:  Past Medical History:  Diagnosis Date   Anxiety    Depression    GERD (gastroesophageal reflux disease)    Hyperlipidemia    Hypertension    Hypothyroidism     Past Surgical History:  Procedure Laterality Date   ABDOMINAL AORTIC ANEURYSM REPAIR     COLONOSCOPY WITH PROPOFOL N/A 05/10/2018   Procedure: COLONOSCOPY WITH PROPOFOL;  Surgeon: Midge Minium, MD;  Location: [facility] ENDOSCOPY;  Service: Endoscopy;  Laterality: N/A;   JOINT REPLACEMENT     TONSILLECTOMY AND ADENOIDECTOMY     Family History:  Family History  Problem Relation Age of Onset   Varicose Veins Father    Heart attack Brother    Diabetes Brother     Social History:  Social History   Substance and Sexual Activity  Alcohol Use Yes     Social History   Substance and Sexual Activity  Drug Use No    Social History   Socioeconomic  History   Marital status: Widowed    Spouse name: Not on file   Number of children: Not on file   Years of education: Not on file   Highest education level: Not on file  Occupational History   Not on file  Tobacco Use   Smoking status: Never    Passive exposure: Never   Smokeless tobacco: Never  Substance and Sexual Activity   Alcohol use: Yes   Drug use: No   Sexual activity: Not on file  Other Topics Concern   Not on file  Social History Narrative   Not on file   Social Determinants of Health   Financial Resource Strain: Low Risk  (07/16/2022)   Received from [facility], [facility]   Overall Financial Resource Strain (CARDIA)    Difficulty of Paying Living Expenses: Not hard at all  Food Insecurity: No Food Insecurity (09/01/2022)   Hunger Vital Sign    Worried About Running Out of Food in the Last Year: Never true    Ran Out of Food in the Last Year: Never true  Transportation Needs: No Transportation Needs (09/01/2022)   PRAPARE - Administrator, Civil Service (Medical): No    Lack of Transportation (Non-Medical): No  Physical Activity: Not on file  Stress: Not on file  Social Connections: Not on file   Additional Social History:  Sleep: Fair  Appetite:  Good  Current Medications: Current Facility-Administered Medications  Medication Dose Route Frequency Provider Last Rate Last Admin   acetaminophen (TYLENOL) tablet 650 mg  650 mg Oral Q6H PRN Onuoha, Chinwendu V, NP   650 mg at 09/05/22 1535   acidophilus (RISAQUAD) capsule 1 capsule  1 capsule Oral Daily Georgeann Oppenheim, Sudheer B, MD   1 capsule at 09/06/22 0938   alum & mag hydroxide-simeth (MAALOX/MYLANTA) 200-200-20 MG/5ML suspension 30 mL  30 mL Oral Q4H PRN Onuoha, Chinwendu V, NP   30 mL at 09/03/22 2236   clonazePAM (KLONOPIN) tablet 0.5 mg  0.5 mg Oral QHS Lewanda Rife, MD   0.5 mg at 09/05/22 2110   diphenhydrAMINE  (BENADRYL) capsule 50 mg  50 mg Oral TID PRN Onuoha, Chinwendu V, NP   50 mg at 09/01/22 2324   Or   diphenhydrAMINE (BENADRYL) injection 50 mg  50 mg Intramuscular TID PRN Onuoha, Chinwendu V, NP       famotidine (PEPCID) tablet 20 mg  20 mg Oral BID Lolita Patella B, MD   20 mg at 09/06/22 0939   feeding supplement (ENSURE ENLIVE / ENSURE PLUS) liquid 237 mL  237 mL Oral BID BM Sarina Ill, DO   237 mL at 09/04/22 0826   FLUoxetine (PROZAC) capsule 20 mg  20 mg Oral Daily Onuoha, Chinwendu V, NP   20 mg at 09/06/22 0938   hydrOXYzine (ATARAX) tablet 25 mg  25 mg Oral TID PRN Onuoha, Chinwendu V, NP   25 mg at 09/06/22 0453   levothyroxine (SYNTHROID) tablet 50 mcg  50 mcg Oral Q0600 Lewanda Rife, MD   50 mcg at 09/06/22 0606   loratadine (CLARITIN) tablet 10 mg  10 mg Oral Daily Lewanda Rife, MD   10 mg at 09/06/22 6962   losartan (COZAAR) tablet 50 mg  50 mg Oral Daily Lewanda Rife, MD   50 mg at 09/06/22 9528   magnesium hydroxide (MILK OF MAGNESIA) suspension 30 mL  30 mL Oral Daily PRN Onuoha, Chinwendu V, NP       multivitamin with minerals tablet 1 tablet  1 tablet Oral Daily Sarina Ill, DO   1 tablet at 09/06/22 0938   pantoprazole (PROTONIX) EC tablet 80 mg  80 mg Oral BID AC Venora Maples, MD   80 mg at 09/06/22 0749   pravastatin (PRAVACHOL) tablet 40 mg  40 mg Oral q1800 Lewanda Rife, MD   40 mg at 09/05/22 1636   simethicone (MYLICON) chewable tablet 80 mg  80 mg Oral QID Lolita Patella B, MD       white petrolatum (VASELINE) gel   Topical PRN Venora Maples, MD        Lab Results:  Results for orders placed or performed during the hospital encounter of 09/01/22 (from the past 48 hour(s))  CBC with Differential/Platelet     Status: None   Collection Time: 09/05/22  3:10 PM  Result Value Ref Range   WBC 7.9 4.0 - 10.5 K/uL   RBC 4.38 4.22 - 5.81 MIL/uL   Hemoglobin 14.3 13.0 - 17.0 g/dL   HCT 41.3 24.4 - 01.0 %   MCV  97.3 80.0 - 100.0 fL   MCH 32.6 26.0 - 34.0 pg   MCHC 33.6 30.0 - 36.0 g/dL   RDW 27.2 53.6 - 64.4 %   Platelets 236 150 - 400 K/uL   nRBC 0.0 0.0 - 0.2 %   Neutrophils Relative %  72 %   Neutro Abs 5.7 1.7 - 7.7 K/uL   Lymphocytes Relative 19 %   Lymphs Abs 1.5 0.7 - 4.0 K/uL   Monocytes Relative 7 %   Monocytes Absolute 0.6 0.1 - 1.0 K/uL   Eosinophils Relative 1 %   Eosinophils Absolute 0.1 0.0 - 0.5 K/uL   Basophils Relative 1 %   Basophils Absolute 0.0 0.0 - 0.1 K/uL   Immature Granulocytes 0 %   Abs Immature Granulocytes 0.03 0.00 - 0.07 K/uL    Comment: Performed at [facility], [address] [place], [place] [address]  Comprehensive metabolic panel     Status: Abnormal   Collection Time: 09/05/22  3:10 PM  Result Value Ref Range   Sodium 139 135 - 145 mmol/L   Potassium 3.9 3.5 - 5.1 mmol/L   Chloride 107 98 - 111 mmol/L   CO2 24 22 - 32 mmol/L   Glucose, Bld 118 (H) 70 - 99 mg/dL    Comment: Glucose reference range applies only to samples taken after fasting for at least 8 hours.   BUN 21 8 - 23 mg/dL   Creatinine, Ser 6.04 0.61 - 1.24 mg/dL   Calcium 8.8 (L) 8.9 - 10.3 mg/dL   Total Protein 6.7 6.5 - 8.1 g/dL   Albumin 3.6 3.5 - 5.0 g/dL   AST 17 15 - 41 U/L   ALT 14 0 - 44 U/L   Alkaline Phosphatase 64 38 - 126 U/L   Total Bilirubin 0.5 0.3 - 1.2 mg/dL   GFR, Estimated >54 >09 mL/min    Comment: (NOTE) Calculated using the CKD-EPI Creatinine Equation (2021)    Anion gap 8 5 - 15    Comment: Performed at [facility], [address], [place], [place] [address]    Blood Alcohol level:  Lab Results  Component Value Date   [facility] <10 09/01/2022    Metabolic Disorder Labs: No results found for: "HGBA1C", "MPG" No results found for: "PROLACTIN" No results found for: "CHOL", "TRIG", "HDL", "CHOLHDL", "VLDL", "LDLCALC"   Musculoskeletal: Strength & Muscle Tone: within normal limits Gait & Station: normal Patient leans: N/A    Psychiatric Specialty Exam:   Presentation  General Appearance:  Appropriate for Environment; Casual   Eye Contact: Fair   Speech: Clear and Coherent   Speech Volume: Normal   Handedness: Right     Mood and Affect  Mood: Anxious    Affect: More animated     Thought Process  Thought Processes: Goal Directed   Descriptions of Associations:Intact   Orientation:Full (Time, Place and Person)   Thought Content: Improved   History of Schizophrenia/Schizoaffective disorder:No   Hallucinations:Hallucinations: None   Ideas of Reference:None   Suicidal Thoughts:Suicidal Thoughts:Denies   Homicidal Thoughts:Homicidal Thoughts: No     Sensorium  Memory: Immediate Good; Recent Good; Remote Good   Judgment: Improving   Insight: Fair     Art therapist  Concentration: Fair   Attention Span: Fair   Recall: Good   Fund of Knowledge: Good   Language: Good   Psychomotor Activity: Psychomotor Activity: Normal     Assets: Communication Skills; Desire for Improvement; Financial Resources/Insurance; Housing    Sleep: Sleep: Fair Number of Hours of Sleep: 6    Physical Exam: Physical Exam Constitutional:      Appearance: Normal appearance.  HENT:     Head: Normocephalic and atraumatic.     Nose: Nose normal.  Eyes:     Pupils: Pupils are  equal, round, and reactive to light.  Cardiovascular:     Rate and Rhythm: Normal rate.     Pulses: Normal pulses.  Pulmonary:     Effort: Pulmonary effort is normal.  Skin:    General: Skin is warm and dry.  Neurological:     General: No focal deficit present.     Mental Status: He is alert.      Review of Systems  Constitutional:  Negative HENT:  Negative for ear discharge and ear pain.   Eyes:  Negative for blurred vision and double vision.  Respiratory:  Negative for cough.   Cardiovascular:  Negative for chest pain and palpitations.  Gastrointestinal:  Negative for nausea and  vomiting.  Neurological:  Negative for dizziness, speech change  Blood pressure 137/68, pulse 77, temperature (!) 97 F (36.1 C), resp. rate 16, height 6' (1.829 m), weight 83.9 kg, SpO2 96%. Body mass index is 25.09 kg/m.   Treatment Plan Summary: Daily contact with patient to assess and evaluate symptoms and progress in treatment and Medication management  Lewanda Rife, MD

## 2022-09-06 NOTE — Progress Notes (Signed)
   09/06/22 0615  15 Minute Checks  Location Bedroom  Visual Appearance Calm  Behavior Composed  Sleep (Behavioral Health Patients Only)  Calculate sleep? (Click Yes once per 24 hr at 0600 safety check) Yes  Documented sleep last 24 hours 8.75

## 2022-09-06 NOTE — Plan of Care (Signed)
  Problem: Education: Goal: Knowledge of General Education information will improve Description: Including pain rating scale, medication(s)/side effects and non-pharmacologic comfort measures Outcome: Progressing   Problem: Health Behavior/Discharge Planning: Goal: Ability to manage health-related needs will improve Outcome: Progressing   Problem: Self-Concept: Goal: Ability to identify factors that promote anxiety will improve Outcome: Not Progressing Goal: Level of anxiety will decrease Outcome: Not Progressing

## 2022-09-06 NOTE — Progress Notes (Signed)
Patient presents with anxiety. He denies SI/HI/AVH. He denies depression. Anxiety rated 6/10. Relaxation techniques taught. Patient verbalized understanding. Appetite good. Denies pain.  Urinalysis collected at approx 1500 for complaints of "it feels like I have a UTI." Patient is expected to discharge tomorrow.  Q15 minute unit checks in place.

## 2022-09-06 NOTE — Progress Notes (Signed)
PROGRESS NOTE    WARFIELD FICKE  ZOX:[id number] DOB: 08/25/1950 DOA: 09/01/2022 PCP: Marguarite Arbour, MD    Brief Narrative:  72 y.o. male with past medical history of anxiety and depression, HTN, AAA, popliteal and femoral artery aneurysm, GERD, COPD, who is currently admitted to the geriatric psychiatry unit with suicidal ideation. TRH asked to consult for several symptoms patient is reporting.   On my interview patient reports a discomfort like "bubbling" that he feels in his abdomen. Comes and goes, does notice an increase after meals. Also associated with his known reflux. Has been ongoing for about a year. He has been taking probiotics which he thinks have helped. On review of chart he saw GI Dr. Servando Snare on 06/09/2022, at that time unclear why he had not been improving, plan had been to switch to esomeprazole and repeat EGD to evaluate. The last imaging he had of his abdomen was a CT A/P on 12/16/2018 which showed normal liver and small amount of cholelithiasis.    Patient also reports dryness and discomfort in his sinuses. Primarily bothered by the dryness. Has been using flonase for many years as well as iptratropium nasal spray. Denies use of Affrin. Does not have pain or nasal discharge, does not feel like he has an active sinus infection at present. Had a CT scan of his head recently on 08/26/2022 which showed mild mucosal thickening but no acute sinusitis.    Assessment & Plan:   Principal Problem:   Adjustment disorder with mixed anxiety and depressed mood Active Problems:   Hypertension   Dyspepsia   Sinus pain  Dyspepsia Appears to have a mix of reflux, gastritis, and possible biliary colic symptoms.   Appears to be improving with multimodal reflux regimen Right upper quadrant ultrasound reassuring CBC CMP reassuring Having normal bowel movements Recommendations: Continue high-dose twice daily PPI Continue Pepcid 20 mg p.o. twice daily Continue probiotic As needed Mylanta  for indigestion/gastritis symptoms Add simethicone 80 QID   Sinus pain No symptoms nor imaging findings to suggest acute sinusitis at present.   Primary symptom from speaking with the patient is dryness of the naris.   Will discontinue his ipratropium nasal spray and recommended a holiday from [place] as well.   Apply Vaseline as needed to nares, also recommended nasal saline washes on a regular basis.   Hypertension Mildly above goal on losartan 50, consider increasing to 100 mg.  [facility] hospitalist service to sign off at this time.   Thank you for allowing [place] to participate in the care of your patient Please reach out with further questions or concerns   DVT prophylaxis: per primary team Code Status: FULL Family Communication:NA Disposition Plan: per primary Level of care: Gero-psych  Consultants:  Hospitalist  Procedures:  None  Antimicrobials: None    Subjective: Seen and examined.  No visible distress.  Continues to endorse some abdominal discomfort.  Laboratory data reassuring.  Right upper quadrant ultrasound negative.  Objective: Vitals:   09/05/22 0714 09/05/22 1839 09/05/22 2000 09/06/22 0718  BP: (!) 145/81 133/73 (!) 159/77 137/68  Pulse: (!) 59 69 62 77  Resp: 20 (!) 24 (!) 22 16  Temp: (!) 97.2 F (36.2 C) 98.4 F (36.9 C) 98.1 F (36.7 C) (!) 97 F (36.1 C)  TempSrc:      SpO2: 100% 100% 100% 96%  Weight:      Height:       No intake or output data in the 24 hours ending 09/06/22  1053 Filed Weights   09/01/22 2300  Weight: 83.9 kg    Examination:  General exam: Appears calm and comfortable  Respiratory system: Clear to auscultation. Respiratory effort normal. Cardiovascular system: S1-S2, RRR, no murmurs, no pedal edema Gastrointestinal system: Soft, nontender, nondistended, normal bowel sounds Central nervous system: Alert and oriented. No focal neurological deficits. Extremities: Symmetric 5 x 5 power. Skin: No rashes, lesions or  ulcers Psychiatry: Judgement and insight appear normal. Mood & affect appropriate.     Data Reviewed: I have personally reviewed following labs and imaging studies  CBC: Recent Labs  Lab 09/01/22 1302 09/05/22 1510  WBC 6.8 7.9  NEUTROABS  --  5.7  HGB 14.7 14.3  HCT 44.7 42.6  MCV 97.4 97.3  PLT 242 236   Basic Metabolic Panel: Recent Labs  Lab 09/01/22 1302 09/05/22 1510  NA 134* 139  K 3.8 3.9  CL 101 107  CO2 24 24  GLUCOSE 100* 118*  BUN 15 21  CREATININE 0.83 0.92  CALCIUM 9.5 8.8*   GFR: Estimated Creatinine Clearance: 79.7 mL/min (by C-G formula based on SCr of 0.92 mg/dL). Liver Function Tests: Recent Labs  Lab 09/01/22 1302 09/05/22 1510  AST 15 17  ALT 14 14  ALKPHOS 62 64  BILITOT 0.5 0.5  PROT 7.4 6.7  ALBUMIN 4.8 3.6   No results for input(s): "LIPASE", "AMYLASE" in the last 168 hours. No results for input(s): "AMMONIA" in the last 168 hours. Coagulation Profile: No results for input(s): "INR", "PROTIME" in the last 168 hours. Cardiac Enzymes: No results for input(s): "CKTOTAL", "CKMB", "CKMBINDEX", "TROPONINI" in the last 168 hours. BNP (last 3 results) No results for input(s): "PROBNP" in the last 8760 hours. HbA1C: No results for input(s): "HGBA1C" in the last 72 hours. CBG: No results for input(s): "GLUCAP" in the last 168 hours. Lipid Profile: No results for input(s): "CHOL", "HDL", "LDLCALC", "TRIG", "CHOLHDL", "LDLDIRECT" in the last 72 hours. Thyroid Function Tests: No results for input(s): "TSH", "T4TOTAL", "FREET4", "T3FREE", "THYROIDAB" in the last 72 hours. Anemia Panel: No results for input(s): "VITAMINB12", "FOLATE", "FERRITIN", "TIBC", "IRON", "RETICCTPCT" in the last 72 hours. Sepsis Labs: No results for input(s): "PROCALCITON", "LATICACIDVEN" in the last 168 hours.  No results found for this or any previous visit (from the past 240 hour(s)).       Radiology Studies: US Abdomen Limited RUQ (LIVER/GB)  Result  Date: 09/05/2022 CLINICAL DATA:  Indigestion. EXAM: ULTRASOUND ABDOMEN LIMITED RIGHT UPPER QUADRANT COMPARISON:  CT abdomen 12/16/2018 and abdominal ultrasound 01/11/2015 FINDINGS: Gallbladder: Mild distention of the gallbladder without wall thickening. No sonographic Murphy sign. Evidence for multiple small echogenic gallbladder polyps. Largest measuring 0.5 cm. Multiple small polyps noted on the previous examination. No evidence for gallstones. Common bile duct: Diameter: 0.4 cm. Liver: No focal lesion identified. Slightly increased echogenicity in the liver. Limited evaluation of the left hepatic lobe due to bowel gas. No intra hepatic biliary dilatation. Portal vein is patent on color Doppler imaging with normal direction of blood flow towards the liver. Other: None. IMPRESSION: 1. No acute abnormality in the right upper quadrant. 2. Multiple small gallbladder polyps. Largest measuring 0.5 cm. Similar finding on the previous ultrasound examination and no dedicated follow-up needed. Electronically Signed   By: Richarda Overlie M.D.   On: 09/05/2022 12:57        Scheduled Meds:  acidophilus  1 capsule Oral Daily   clonazePAM  0.5 mg Oral QHS   famotidine  20 mg Oral BID  feeding supplement  237 mL Oral BID BM   FLUoxetine  20 mg Oral Daily   levothyroxine  50 mcg Oral Q0600   loratadine  10 mg Oral Daily   losartan  50 mg Oral Daily   multivitamin with minerals  1 tablet Oral Daily   pantoprazole  80 mg Oral BID AC   pravastatin  40 mg Oral q1800   simethicone  80 mg Oral QID   Continuous Infusions:   LOS: 5 days    Tresa Moore, MD [facility]   If 7PM-7AM, please contact night-coverage  09/06/2022, 10:53 AM

## 2022-09-06 NOTE — Progress Notes (Signed)
   09/06/22 2000  Psych Admission Type (Psych Patients Only)  Admission Status Voluntary  Psychosocial Assessment  Patient Complaints Anxiety  Eye Contact Fair  Facial Expression Anxious  Affect Appropriate to circumstance  Speech Logical/coherent  Interaction Needy  Motor Activity Other (Comment) (unremarkable)  Appearance/Hygiene Unremarkable  Behavior Characteristics Anxious  Mood Pleasant;Anxious  Thought Process  Coherency WDL  Content WDL  Delusions None reported or observed  Perception WDL  Hallucination None reported or observed  Judgment WDL  Confusion None  Danger to Self  Current suicidal ideation? Denies  Agreement Not to Harm Self Yes  Description of Agreement verbal  Danger to Others  Danger to Others None reported or observed

## 2022-09-07 MED ORDER — FAMOTIDINE 20 MG PO TABS: 20.0000 mg | ORAL_TABLET | Freq: Two times a day (BID) | ORAL | 0 refills | Status: AC

## 2022-09-07 MED ORDER — WHITE PETROLATUM EX OINT: 1.0000 | TOPICAL_OINTMENT | CUTANEOUS | 0 refills | Status: DC | PRN

## 2022-09-07 MED ORDER — ADULT MULTIVITAMIN W/MINERALS CH: 1.0000 | ORAL_TABLET | Freq: Every day | ORAL | 0 refills | Status: AC

## 2022-09-07 MED ORDER — LEVOTHYROXINE SODIUM 50 MCG PO TABS: 50.0000 ug | ORAL_TABLET | Freq: Every day | ORAL | 0 refills | Status: AC

## 2022-09-07 MED ORDER — HYDROXYZINE HCL 25 MG PO TABS: 25.0000 mg | ORAL_TABLET | Freq: Three times a day (TID) | ORAL | 0 refills | Status: AC | PRN

## 2022-09-07 MED ORDER — LOSARTAN POTASSIUM 50 MG PO TABS: 50.0000 mg | ORAL_TABLET | Freq: Every day | ORAL | 0 refills | Status: AC

## 2022-09-07 MED ORDER — FLUOXETINE HCL 20 MG PO CAPS: 20.0000 mg | ORAL_CAPSULE | Freq: Every day | ORAL | 0 refills | Status: AC

## 2022-09-07 MED ORDER — PANTOPRAZOLE SODIUM 40 MG PO TBEC: 80.0000 mg | DELAYED_RELEASE_TABLET | Freq: Two times a day (BID) | ORAL | 0 refills | Status: DC

## 2022-09-07 MED ORDER — PRAVASTATIN SODIUM 40 MG PO TABS: 40.0000 mg | ORAL_TABLET | Freq: Every day | ORAL | 0 refills | Status: AC

## 2022-09-07 MED ORDER — LORATADINE 10 MG PO TABS: 10.0000 mg | ORAL_TABLET | Freq: Every day | ORAL | 0 refills | Status: DC

## 2022-09-07 MED ORDER — RISAQUAD PO CAPS: 1.0000 | ORAL_CAPSULE | Freq: Every day | ORAL | 0 refills | Status: DC

## 2022-09-07 MED ORDER — CLONAZEPAM 0.5 MG PO TABS: 0.5000 mg | ORAL_TABLET | Freq: Every day | ORAL | 0 refills | Status: AC

## 2022-09-07 NOTE — Plan of Care (Signed)
  Problem: Education: Goal: Knowledge of General Education information will improve Description: Including pain rating scale, medication(s)/side effects and non-pharmacologic comfort measures Outcome: Adequate for Discharge   Problem: Health Behavior/Discharge Planning: Goal: Ability to manage health-related needs will improve Outcome: Adequate for Discharge   Problem: Clinical Measurements: Goal: Ability to maintain clinical measurements within normal limits will improve Outcome: Adequate for Discharge Goal: Will remain free from infection Outcome: Adequate for Discharge Goal: Diagnostic test results will improve Outcome: Adequate for Discharge Goal: Respiratory complications will improve Outcome: Adequate for Discharge Goal: Cardiovascular complication will be avoided Outcome: Adequate for Discharge   Problem: Activity: Goal: Risk for activity intolerance will decrease Outcome: Adequate for Discharge   Problem: Nutrition: Goal: Adequate nutrition will be maintained Outcome: Adequate for Discharge   Problem: Coping: Goal: Level of anxiety will decrease Outcome: Adequate for Discharge   Problem: Elimination: Goal: Will not experience complications related to bowel motility Outcome: Adequate for Discharge Goal: Will not experience complications related to urinary retention Outcome: Adequate for Discharge   Problem: Pain Managment: Goal: General experience of comfort will improve Outcome: Adequate for Discharge   Problem: Safety: Goal: Ability to remain free from injury will improve Outcome: Adequate for Discharge   Problem: Skin Integrity: Goal: Risk for impaired skin integrity will decrease Outcome: Adequate for Discharge   Problem: Education: Goal: Ability to state activities that reduce stress will improve Outcome: Adequate for Discharge   Problem: Coping: Goal: Ability to identify and develop effective coping behavior will improve Outcome: Adequate for  Discharge   Problem: Self-Concept: Goal: Ability to identify factors that promote anxiety will improve Outcome: Adequate for Discharge Goal: Level of anxiety will decrease Outcome: Adequate for Discharge Goal: Ability to modify response to factors that promote anxiety will improve Outcome: Adequate for Discharge   Problem: Education: Goal: Utilization of techniques to improve thought processes will improve Outcome: Adequate for Discharge Goal: Knowledge of the prescribed therapeutic regimen will improve Outcome: Adequate for Discharge   Problem: Activity: Goal: Interest or engagement in leisure activities will improve Outcome: Adequate for Discharge Goal: Imbalance in normal sleep/wake cycle will improve Outcome: Adequate for Discharge   Problem: Coping: Goal: Coping ability will improve Outcome: Adequate for Discharge Goal: Will verbalize feelings Outcome: Adequate for Discharge   Problem: Health Behavior/Discharge Planning: Goal: Ability to make decisions will improve Outcome: Adequate for Discharge Goal: Compliance with therapeutic regimen will improve Outcome: Adequate for Discharge   Problem: Role Relationship: Goal: Will demonstrate positive changes in social behaviors and relationships Outcome: Adequate for Discharge   Problem: Safety: Goal: Ability to disclose and discuss suicidal ideas will improve Outcome: Adequate for Discharge Goal: Ability to identify and utilize support systems that promote safety will improve Outcome: Adequate for Discharge   Problem: Self-Concept: Goal: Will verbalize positive feelings about self Outcome: Adequate for Discharge Goal: Level of anxiety will decrease Outcome: Adequate for Discharge

## 2022-09-07 NOTE — Progress Notes (Signed)
Patient ID: Mitchell Powell, male   DOB: 1950-12-03, 72 y.o.   MRN: [id number]  Patient was discharged from [facility] unit at approx 1245 escorted by staff. Patient denies SI/HI/AVH. Discharge packet to include printed AVS, Suicide Risk Assessment, and Transition Record reviewed with patient. Belongings to include eyeglasses (on person) returned and patient verified receipt with signature. Suicide safety plan completed with a copy kept in chart. Patient was given the number to the [facility] Shuttle so that he can be taken to his vehicle. Staff assisted and waited with patient until shuttle arrival.

## 2022-09-07 NOTE — Care Management Important Message (Signed)
Important Message  Patient Details  Name: Mitchell Powell MRN: [id number] Date of Birth: 10/01/1950   Medicare Important Message Given:  Yes     Laretta Alstrom 09/07/2022, 10:50 AM

## 2022-09-07 NOTE — Discharge Summary (Signed)
Physician Discharge Summary Note  Patient:  Mitchell Powell is an 72 y.o., male MRN:  [id number] DOB:  [birth year] Patient phone:  [phone] (home)  Patient address:   [address] [place] [address],   Date of Admission:  09/01/2022 Date of Discharge: 09/07/22  Reason for Admission:  Patient is a 72 year old male who presented to emergency department because of depression and suicidal thoughts.   Principal Problem: Adjustment disorder with mixed anxiety and depressed mood Discharge Diagnoses: Principal Problem:   Adjustment disorder with mixed anxiety and depressed mood Active Problems:   Hypertension   Dyspepsia   Sinus pain   Past Psychiatric History: Patient reports history of depression   Past Medical History:  Past Medical History:  Diagnosis Date   Anxiety    Depression    GERD (gastroesophageal reflux disease)    Hyperlipidemia    Hypertension    Hypothyroidism     Past Surgical History:  Procedure Laterality Date   ABDOMINAL AORTIC ANEURYSM REPAIR     COLONOSCOPY WITH PROPOFOL N/A 05/10/2018   Procedure: COLONOSCOPY WITH PROPOFOL;  Surgeon: Midge Minium, MD;  Location: [facility] ENDOSCOPY;  Service: Endoscopy;  Laterality: N/A;   JOINT REPLACEMENT     TONSILLECTOMY AND ADENOIDECTOMY     Family History:  Family History  Problem Relation Age of Onset   Varicose Veins Father    Heart attack Brother    Diabetes Brother     Social History:  Social History   Substance and Sexual Activity  Alcohol Use Yes     Social History   Substance and Sexual Activity  Drug Use No    Social History   Socioeconomic History   Marital status: Widowed    Spouse name: Not on file   Number of children: Not on file   Years of education: Not on file   Highest education level: Not on file  Occupational History   Not on file  Tobacco Use   Smoking status: Never    Passive exposure: Never   Smokeless tobacco: Never  Substance and Sexual Activity    Alcohol use: Yes   Drug use: No   Sexual activity: Not on file  Other Topics Concern   Not on file  Social History Narrative   Not on file   Social Determinants of Health   Financial Resource Strain: Low Risk  (07/16/2022)   [facility] from [facility]   Overall Financial Resource Strain (CARDIA)    Difficulty of Paying Living Expenses: Not hard at all  Food Insecurity: No Food Insecurity (09/01/2022)   Hunger Vital Sign    Worried About Running Out of Food in the Last Year: Never true    Ran Out of Food in the Last Year: Never true  Transportation Needs: No Transportation Needs (09/01/2022)   PRAPARE - Administrator, Civil Service (Medical): No    Lack of Transportation (Non-Medical): No  Physical Activity: Not on file  Stress: Not on file  Social Connections: Not on file    Hospital Course:   The patient was admitted to Inpatient psychiatric treatment for stabilization of depression and suicidal thoughts. Patient was placed on suicidal precautions. The patient was evaluated and treated by the multidisciplinary treatment team including physicians, nurses, social workers and therapists. All medications were presented to the patient and the Patient gave consent to all the medications that they were given, as well as was explained the risks, benefits, side  effects and alternatives of all medication therapies. The patient was integrated into the general milieu on the ward and encouraged to attend to his ADLs and participate in all groups and activities. During hospital course the Patient attended coping skill groups, music therapy and activity therapy groups. Patient was counseled on cognitive techniques/skills by multiple staff members and given support care by the staff.  Patient's medication regimen was evaluated and titrated to therapeutic levels to better Patient's overall daily functioning. Specifically, the patient was started on  home medications including Lexapro, Klonopin, Synthroid, pravastatin, lisinopril and losartan. Dose of Lexapro was decreased due to GI symptoms. He tolerated the medication well with no significant side effects.  Hospitalist team was consulted to follow-up on medical needs.  During the hospitalization, the patient demonstrated a stabilization of mood with improved sleep and appetite. At the time of discharge, the patient denied any suicidal ideation/homicidal ideation and was not overtly depressed, manic or psychotic. The Patient was interacting well in groups and on the unit with their peers. Patient was able to identify a safety plan to include speaking with family, contacting outpatient provider or calling 911 if hallucinations/delusions returned or worsened or thoughts of self-harm or suicide return. Patient was counselled on outpatient follow-up that was arranged prior to discharge.   Musculoskeletal: Strength & Muscle Tone: within normal limits Gait & Station: normal Patient leans: N/A   Psychiatric Specialty Exam:   Presentation  General Appearance:  Appropriate for Environment; Casual   Eye Contact: Fair   Speech: Clear and Coherent   Speech Volume: Normal   Handedness: Right     Mood and Affect  Mood: Fine   Affect: More animated       Thought Processes: Goal Directed   Descriptions of Associations:Intact   Orientation:Full (Time, Place and Person)   Thought Content: Improved   History of Schizophrenia/Schizoaffective disorder:No   Hallucinations:Hallucinations: None   Ideas of Reference:None   Suicidal Thoughts:Suicidal Thoughts:Denies, no intentions or plans   Homicidal Thoughts:Homicidal Thoughts: No     Sensorium  Memory: Immediate Good; Recent Good; Remote Good   Judgment: Improving   Insight: Fair     Art therapist  Concentration: Fair   Attention Span: Fair   Recall: Good   Fund of Knowledge: Good    Language: Good   Psychomotor Activity: Psychomotor Activity: Normal     Assets: Communication Skills; Desire for Improvement; Financial Resources/Insurance; Housing     Sleep: Sleep: Fair Number of Hours of Sleep: 6       Physical Exam Constitutional:      Appearance: Normal appearance.  HENT:     Head: Normocephalic and atraumatic.     Nose: Nose normal.  Eyes:     Pupils: Pupils are equal, round, and reactive to light.  Cardiovascular:     Rate and Rhythm: Normal rate.     Pulses: Normal pulses.  Pulmonary:     Effort: Pulmonary effort is normal.  Skin:    General: Skin is warm and dry.  Neurological:     General: No focal deficit present.     Mental Status: He is alert.      Review of Systems  Constitutional:  Negative HENT:  Negative for ear discharge and ear pain.   Eyes:  Negative for blurred vision and double vision.  Respiratory:  Negative for cough.   Cardiovascular:  Negative for chest pain and palpitations.  Gastrointestinal:  Negative for nausea and vomiting.  Neurological:  Negative for dizziness, speech  change Blood pressure 138/81, pulse 69, temperature 97.8 F (36.6 C), resp. rate 16, height 6' (1.829 m), weight 83.9 kg, SpO2 95%. Body mass index is 25.09 kg/m.   Social History   Tobacco Use  Smoking Status Never   Passive exposure: Never  Smokeless Tobacco Never   Tobacco Cessation:  N/A, patient does not currently use tobacco products   Blood Alcohol level:  Lab Results  Component Value Date   ETH <10 09/01/2022    Metabolic Disorder Labs:  No results found for: "HGBA1C", "MPG" No results found for: "PROLACTIN" No results found for: "CHOL", "TRIG", "HDL", "CHOLHDL", "VLDL", "LDLCALC"  See Psychiatric Specialty Exam and Suicide Risk Assessment completed by Attending Physician prior to discharge.  Discharge destination:  Home  Is patient on multiple antipsychotic therapies at discharge:  No    Recommended Plan for Multiple  Antipsychotic Therapies: NA   Allergies as of 09/07/2022       Reactions   Prednisone    Other reaction(s): Other (See Comments) irritiability        Medication List     STOP taking these medications    benazepril 20 MG tablet Commonly known as: LOTENSIN   buPROPion 150 MG 24 hr tablet Commonly known as: WELLBUTRIN XL   ciprofloxacin 500 MG tablet Commonly known as: CIPRO   esomeprazole 40 MG capsule Commonly known as: NexIUM   HYDROcodone-acetaminophen 5-325 MG tablet Commonly known as: NORCO/VICODIN   methocarbamol 500 MG tablet Commonly known as: ROBAXIN       TAKE these medications      Indication  acidophilus Caps capsule Take 1 capsule by mouth daily. Start taking on: September 08, 2022    clonazePAM 0.5 MG tablet Commonly known as: KLONOPIN Take 1 tablet (0.5 mg total) by mouth at bedtime. What changed:  how much to take when to take this reasons to take this    famotidine 20 MG tablet Commonly known as: PEPCID Take 1 tablet (20 mg total) by mouth 2 (two) times daily.    finasteride 5 MG tablet Commonly known as: PROSCAR TAKE 1 TABLET EVERY DAY    Fish Oil 1000 MG Cpdr Take by mouth.    FLUoxetine 20 MG capsule Commonly known as: PROZAC Take 1 capsule (20 mg total) by mouth daily. Start taking on: September 08, 2022 What changed:  how much to take when to take this Another medication with the same name was removed. Continue taking this medication, and follow the directions you see here.  Indication: Depression   fluticasone 50 MCG/ACT nasal spray Commonly known as: FLONASE Place into the nose.    hydrOXYzine 25 MG tablet Commonly known as: ATARAX Take 1 tablet (25 mg total) by mouth 3 (three) times daily as needed for anxiety.    ipratropium 0.03 % nasal spray Commonly known as: ATROVENT Place 2 sprays into both nostrils 2 (two) times daily.    levothyroxine 50 MCG tablet Commonly known as: SYNTHROID Take 1 tablet (50 mcg total) by  mouth daily at 6 (six) AM. Start taking on: September 08, 2022 What changed: when to take this    loratadine 10 MG tablet Commonly known as: CLARITIN Take 1 tablet (10 mg total) by mouth daily. Start taking on: September 08, 2022 What changed:  how much to take when to take this    LORazepam 0.5 MG tablet Commonly known as: ATIVAN Take 0.5 mg by mouth daily.    losartan 50 MG tablet Commonly known as: COZAAR Take  1 tablet (50 mg total) by mouth daily.    montelukast 10 MG tablet Commonly known as: SINGULAIR Take 10 mg by mouth at bedtime.    multivitamin with minerals Tabs tablet Take 1 tablet by mouth daily. Start taking on: September 08, 2022    pantoprazole 40 MG tablet Commonly known as: PROTONIX Take 2 tablets (80 mg total) by mouth 2 (two) times daily before a meal. What changed:  how much to take when to take this    pravastatin 40 MG tablet Commonly known as: PRAVACHOL Take 1 tablet (40 mg total) by mouth daily at 6 PM. What changed:  how much to take when to take this    tadalafil 5 MG tablet Commonly known as: CIALIS Take 1 tablet (5 mg total) by mouth daily as needed for erectile dysfunction.    white petrolatum Oint Commonly known as: VASELINE Apply 1 Application topically as needed (dry nares).         Follow-up Information     [facility] Follow up in 7 day(s).   Why: Your appointment is a VIRTUAL appointment. Please check your email for instructions to log on to the appointment. If you have any questions please contact the office at [phone]. Contact information: [address]  [address] [place] [address] [phone]                PATIENTS CONDITION AT DISCHARGE:  Stable  TOBACCO CESSATION SCREENING  Patient was screened and counselled on smoking cessation at time of discharge. Pt is not a smoker or smokes less than a quarter pack per day and have verbalized that they do not require assistance with tobacco cessation at this  time.  Patient was screened and counselled on tobacco cessation at time of discharge. Patient verbalized a desire for medical assistance with tobacco cessation. Patient has been provided with Prescription medication for tobacco cessation and these medications are listed in Patient's discharge medication list.  PRESCRIPTION ARE LOCATED:  On Chart  DISCHARGE INSTRUCTIONS:  1. Diet: Cardiac  2. Activity: As tolerated  3. Take medications as prescribed and not to make any changes without first consulting with the outpatient provider.  4. Patient was advised to avoid any illicit drugs or alcohol due to negative impact on physical and mental health.  5. Patient should keep all follow up appointments.  TIME SPENT ON DISCHARGE: Over 35 minutes were spent on this patients discharge including a face to face encounter, patient counseling and preparation of discharge materials.   Signed: Lewanda Rife, MD

## 2022-09-07 NOTE — Progress Notes (Signed)
   09/07/22 0557  15 Minute Checks  Location Bedroom  Visual Appearance Calm  Behavior Sleeping  Sleep (Behavioral Health Patients Only)  Calculate sleep? (Click Yes once per 24 hr at 0600 safety check) Yes  Documented sleep last 24 hours 10.25

## 2022-09-07 NOTE — Group Note (Signed)
Date:  09/07/2022 Time:  10:39 AM  Group Topic/Focus:  Rediscovering Joy:   The focus of this group is to explore various ways to relieve stress in a positive manner.    Participation Level:  Did Not Attend  Participation Quality:    Affect:    Cognitive:    Insight:   Engagement in Group:    Modes of Intervention:    Additional Comments:    Leonie Green 09/07/2022, 10:39 AM

## 2022-09-07 NOTE — [facility] Suicide Risk Assessment (Signed)
[facility] Discharge Suicide Risk Assessment   Principal Problem: Adjustment disorder with mixed anxiety and depressed mood Discharge Diagnoses: Principal Problem:   Adjustment disorder with mixed anxiety and depressed mood Active Problems:   Hypertension   Dyspepsia   Sinus pain  Musculoskeletal: Strength & Muscle Tone: within normal limits Gait & Station: normal Patient leans: N/A   Psychiatric Specialty Exam:   Presentation  General Appearance:  Appropriate for Environment; Casual   Eye Contact: Fair   Speech: Clear and Coherent   Speech Volume: Normal   Handedness: Right     Mood and Affect  Mood: Fine   Affect: More animated      Thought Processes: Goal Directed   Descriptions of Associations:Intact   Orientation:Full (Time, Place and Person)   Thought Content: Improved   History of Schizophrenia/Schizoaffective disorder:No   Hallucinations:Hallucinations: None   Ideas of Reference:None   Suicidal Thoughts:Suicidal Thoughts:Denies, no intentions or plans   Homicidal Thoughts:Homicidal Thoughts: No     Sensorium  Memory: Immediate Good; Recent Good; Remote Good   Judgment: Improving   Insight: Fair     Art therapist  Concentration: Fair   Attention Span: Fair   Recall: Good   Fund of Knowledge: Good   Language: Good   Psychomotor Activity: Psychomotor Activity: Normal     Assets: Communication Skills; Desire for Improvement; Financial Resources/Insurance; Housing     Sleep: Sleep: Fair Number of Hours of Sleep: 6      Physical Exam Constitutional:      Appearance: Normal appearance.  HENT:     Head: Normocephalic and atraumatic.     Nose: Nose normal.  Eyes:     Pupils: Pupils are equal, round, and reactive to light.  Cardiovascular:     Rate and Rhythm: Normal rate.     Pulses: Normal pulses.  Pulmonary:     Effort: Pulmonary effort is normal.  Skin:    General: Skin is warm and dry.   Neurological:     General: No focal deficit present.     Mental Status: He is alert.      Review of Systems  Constitutional:  Negative HENT:  Negative for ear discharge and ear pain.   Eyes:  Negative for blurred vision and double vision.  Respiratory:  Negative for cough.   Cardiovascular:  Negative for chest pain and palpitations.  Gastrointestinal:  Negative for nausea and vomiting.  Neurological:  Negative for dizziness, speech change   Blood pressure 138/81, pulse 69, temperature 97.8 F (36.6 C), resp. rate 16, height 6' (1.829 m), weight 83.9 kg, SpO2 95%. Body mass index is 25.09 kg/m.  Mental Status Per Nursing Assessment::   On Admission:  Suicidal ideation indicated by patient  Demographic Factors:  Male, Age 9 or older, Caucasian, and Living alone  Loss Factors: Loss of significant relationship   Risk Reduction Factors:   Sense of responsibility to family, Positive social support, Positive therapeutic relationship, and Positive coping skills or problem solving skills  Continued Clinical Symptoms:  Previous Psychiatric Diagnoses and Treatments  Cognitive Features That Contribute To Risk:  None    Suicide Risk:  Minimal: No identifiable suicidal ideation.    Follow-up Information     [facility] Follow up in 7 day(s).   Why: Your appointment is a VIRTUAL appointment. Please check your email for instructions to log on to the appointment. If you have any questions please contact the office at [phone]. Contact information: [address]  [address] [place]  [address] [phone]                Per H&P  Lewanda Rife, MD

## 2022-09-07 NOTE — Progress Notes (Signed)
  [facility] Adult Case Management Discharge Plan :  Will you be returning to the same living situation after discharge:  Yes,  Pt will be returning to his home  At discharge, do you have transportation home?: Yes,  pt has a ride  Do you have the ability to pay for your medications: Yes, [employer] MEDICARE / [employer] MEDICARE HMO   Release of information consent forms completed and in the chart;  Patient's signature needed at discharge.  Patient to Follow up at:  Follow-up Information     [facility] Follow up in 7 day(s).   Why: Your appointment is a VIRTUAL appointment. Please check your email for instructions to log on to the appointment. If you have any questions please contact the office at [phone]. Contact information: [address]  [address] [place] [address] [phone]                 Next level of care provider has access to [facility] Link:no  Safety Planning and Suicide Prevention discussed: Yes,  Pt declined      Has patient been referred to the Quitline?: Patient does not use tobacco/nicotine products  Patient has been referred for addiction treatment: No known substance use disorder.  [address] LCSWA 09/07/2022, 10:34 AM

## 2022-09-07 NOTE — Group Note (Signed)
Date:  09/07/2022 Time:  6:59 AM  Group Topic/Focus:  Building Self Esteem:   The Focus of this group is helping patients become aware of the effects of self-esteem on their lives, the things they and others do that enhance or undermine their self-esteem, seeing the relationship between their level of self-esteem and the choices they make and learning ways to enhance self-esteem. Goals Group:   The focus of this group is to help patients establish daily goals to achieve during treatment and discuss how the patient can incorporate goal setting into their daily lives to aide in recovery. Healthy Communication:   The focus of this group is to discuss communication, barriers to communication, as well as healthy ways to communicate with others. Making Healthy Choices:   The focus of this group is to help patients identify negative/unhealthy choices they were using prior to admission and identify positive/healthier coping strategies to replace them upon discharge.    Participation Level:  Active  Participation Quality:  Appropriate and Supportive  Affect:  Appropriate  Cognitive:  Alert and Appropriate  Insight: Appropriate, Good, and Improving  Engagement in Group:  Developing/Improving  Modes of Intervention:  Support  Additional Comments:    Maeola Harman 09/07/2022, 6:59 AM

## 2022-09-09 DIAGNOSIS — M9904 Segmental and somatic dysfunction of sacral region: Secondary | ICD-10-CM | POA: Diagnosis not present

## 2022-09-09 DIAGNOSIS — M9905 Segmental and somatic dysfunction of pelvic region: Secondary | ICD-10-CM | POA: Diagnosis not present

## 2022-09-09 DIAGNOSIS — M9903 Segmental and somatic dysfunction of lumbar region: Secondary | ICD-10-CM | POA: Diagnosis not present

## 2022-09-09 DIAGNOSIS — M9901 Segmental and somatic dysfunction of cervical region: Secondary | ICD-10-CM | POA: Diagnosis not present

## 2022-09-10 DIAGNOSIS — E079 Disorder of thyroid, unspecified: Secondary | ICD-10-CM | POA: Diagnosis not present

## 2022-09-10 DIAGNOSIS — I10 Essential (primary) hypertension: Secondary | ICD-10-CM | POA: Diagnosis not present

## 2022-09-10 DIAGNOSIS — E785 Hyperlipidemia, unspecified: Secondary | ICD-10-CM | POA: Diagnosis not present

## 2022-09-10 DIAGNOSIS — F419 Anxiety disorder, unspecified: Secondary | ICD-10-CM | POA: Diagnosis not present

## 2022-09-10 DIAGNOSIS — Z0000 Encounter for general adult medical examination without abnormal findings: Secondary | ICD-10-CM | POA: Diagnosis not present

## 2022-09-10 DIAGNOSIS — F32A Depression, unspecified: Secondary | ICD-10-CM | POA: Diagnosis not present

## 2022-09-11 ENCOUNTER — Ambulatory Visit: Payer: Medicare HMO | Admitting: Urology

## 2022-09-11 DIAGNOSIS — Z79899 Other long term (current) drug therapy: Secondary | ICD-10-CM | POA: Diagnosis not present

## 2022-09-14 DIAGNOSIS — F332 Major depressive disorder, recurrent severe without psychotic features: Secondary | ICD-10-CM | POA: Diagnosis not present

## 2022-09-16 DIAGNOSIS — M9901 Segmental and somatic dysfunction of cervical region: Secondary | ICD-10-CM | POA: Diagnosis not present

## 2022-09-16 DIAGNOSIS — M9905 Segmental and somatic dysfunction of pelvic region: Secondary | ICD-10-CM | POA: Diagnosis not present

## 2022-09-16 DIAGNOSIS — M9904 Segmental and somatic dysfunction of sacral region: Secondary | ICD-10-CM | POA: Diagnosis not present

## 2022-09-16 DIAGNOSIS — M9903 Segmental and somatic dysfunction of lumbar region: Secondary | ICD-10-CM | POA: Diagnosis not present

## 2022-09-21 ENCOUNTER — Ambulatory Visit: Payer: Medicare HMO | Admitting: Clinical

## 2022-09-23 DIAGNOSIS — M9903 Segmental and somatic dysfunction of lumbar region: Secondary | ICD-10-CM | POA: Diagnosis not present

## 2022-09-23 DIAGNOSIS — M9904 Segmental and somatic dysfunction of sacral region: Secondary | ICD-10-CM | POA: Diagnosis not present

## 2022-09-23 DIAGNOSIS — M9901 Segmental and somatic dysfunction of cervical region: Secondary | ICD-10-CM | POA: Diagnosis not present

## 2022-09-23 DIAGNOSIS — M9905 Segmental and somatic dysfunction of pelvic region: Secondary | ICD-10-CM | POA: Diagnosis not present

## 2022-09-25 DIAGNOSIS — J328 Other chronic sinusitis: Secondary | ICD-10-CM | POA: Diagnosis not present

## 2022-09-25 DIAGNOSIS — J302 Other seasonal allergic rhinitis: Secondary | ICD-10-CM | POA: Diagnosis not present

## 2022-09-29 DIAGNOSIS — F419 Anxiety disorder, unspecified: Secondary | ICD-10-CM | POA: Diagnosis not present

## 2022-09-29 DIAGNOSIS — F332 Major depressive disorder, recurrent severe without psychotic features: Secondary | ICD-10-CM | POA: Diagnosis not present

## 2022-09-29 NOTE — Progress Notes (Signed)
09/30/2022 12:08 PM   Mitchell Powell Sep 10, 1950 [id number]  Referring provider: Marguarite Arbour, MD [address] [facility] [place],  [place] [address]  Urological history: 1. BPH with LU TS -PSA (07/2022) 0.97 -RUS (07/2020) - no hydro -cysto (08/2020) - Prominent lateral lobe enlargement prostate -Moderate elevation bladder neck - Retroflexion shows intravesical median lobe and lateral lobe intravesical extension  -prostate volume ~ 91 cc -finasteride 5 mg daily and tadalafil 5 mg daily    2. ED -contributing factors of age, BPH, HTN, COPD, sleep apnea, hypothyroidism, HLD, anxiety, depression and alcohol consumption -tadalafil 20 mg, on-demand-dosing   Chief Complaint Patient presents with  Benign Prostatic Hypertrophy  HPI: Mitchell Powell is a 72 y.o. male who presents today for one year follow up.  Previous records reviewed.  Recently hospitalized for severe depression and suicidal ideation.   Hx of AAA and OSA on CPAP.   I PSS 23/4  PVR 390 mL   UA yellow clear, pH 5.0, specific gravity <1.005, 0-5 WBC's and 0-2 RBC's.  His symptoms are now so bothersome that he is ready to undergo a bladder outlet procedure at this time.   Patient denies any modifying or aggravating factors.  Patient denies any recent UTI's, gross hematuria, dysuria or suprapubic/flank pain.  Patient denies any fevers, chills, nausea or vomiting.    IPSS     Row Name 09/30/22 1000         International Prostate Symptom Score   How often have you had the sensation of not emptying your bladder? About half the time     How often have you had to urinate less than every two hours? Almost always     How often have you found you stopped and started again several times when you urinated? Less than half the time     How often have you found it difficult to postpone urination? About half the time     How often have you had a weak urinary stream? More than half the time     How  often have you had to strain to start urination? About half the time     How many times did you typically get up at night to urinate? 3 Times     Total IPSS Score 23       Quality of Life due to urinary symptoms   If you were to spend the rest of your life with your urinary condition just the way it is now how would you feel about that? Mostly Disatisfied              Score:  1-7 Mild 8-19 Moderate 20-35 Severe   PMH: Past Medical History: Diagnosis Date  Anxiety  Depression  GERD (gastroesophageal reflux disease)  Hyperlipidemia  Hypertension  Hypothyroidism   Surgical History: Past Surgical History: Procedure Laterality Date  ABDOMINAL AORTIC ANEURYSM REPAIR  COLONOSCOPY WITH PROPOFOL N/A 05/10/2018 Procedure: COLONOSCOPY WITH PROPOFOL;  Surgeon: Midge Minium, MD;  Location: [facility] ENDOSCOPY;  Service: Endoscopy;  Laterality: N/A;  JOINT REPLACEMENT  TONSILLECTOMY AND ADENOIDECTOMY   Home Medications:  Allergies as of 09/30/2022     Reactions Prednisone Other reaction(s): Other (See Comments) irritiability   Medication List   Accurate as of September 30, 2022 12:08 PM. If you have any questions, ask your nurse or doctor.   STOP taking these medications   ipratropium 0.03 % nasal spray Commonly known as: ATROVENT Stopped by: Michiel Cowboy  TAKE these medications   acidophilus Caps capsule Take 1 capsule by mouth daily. clonazePAM 0.5 MG tablet Commonly known as: KLONOPIN Take 1 tablet (0.5 mg total) by mouth at bedtime. famotidine 20 MG tablet Commonly known as: PEPCID Take 1 tablet (20 mg total) by mouth 2 (two) times daily. finasteride 5 MG tablet Commonly known as: PROSCAR TAKE 1 TABLET EVERY DAY Fish Oil 1000 MG Cpdr Take by mouth. FLUoxetine 20 MG capsule Commonly known as: PROZAC Take 1 capsule (20 mg total) by mouth daily. fluticasone 50 MCG/ACT nasal spray Commonly known as: FLONASE Place into the nose. hydrOXYzine  25 MG tablet Commonly known as: ATARAX Take 1 tablet (25 mg total) by mouth 3 (three) times daily as needed for anxiety. levothyroxine 50 MCG tablet Commonly known as: SYNTHROID Take 1 tablet (50 mcg total) by mouth daily at 6 (six) AM. loratadine 10 MG tablet Commonly known as: CLARITIN Take 1 tablet (10 mg total) by mouth daily. LORazepam 0.5 MG tablet Commonly known as: ATIVAN Take 0.5 mg by mouth daily. losartan 50 MG tablet Commonly known as: COZAAR Take 1 tablet (50 mg total) by mouth daily. montelukast 10 MG tablet Commonly known as: SINGULAIR Take 10 mg by mouth at bedtime. multivitamin with minerals Tabs tablet Take 1 tablet by mouth daily. pantoprazole 40 MG tablet Commonly known as: PROTONIX Take 2 tablets (80 mg total) by mouth 2 (two) times daily before a meal. pravastatin 40 MG tablet Commonly known as: PRAVACHOL Take 1 tablet (40 mg total) by mouth daily at 6 PM. tadalafil 5 MG tablet Commonly known as: CIALIS Take 1 tablet (5 mg total) by mouth daily as needed for erectile dysfunction. white petrolatum Oint Commonly known as: VASELINE Apply 1 Application topically as needed (dry nares).    Allergies:  Allergies Allergen Reactions  Prednisone Other reaction(s): Other (See Comments) irritiability   Family History: Family History Problem Relation Age of Onset  Varicose Veins Father  Heart attack Brother  Diabetes Brother   Social History:  reports that he has never smoked. He has never been exposed to tobacco smoke. He has never used smokeless tobacco. He reports current alcohol use. He reports that he does not use drugs.  ROS: Pertinent ROS in HPI  Physical Exam: BP (!) 157/82   Pulse 61   Ht 6' (1.829 m)   Wt 185 lb (83.9 kg)   BMI 25.09 kg/m   Constitutional:  Well nourished. Alert and oriented, No acute distress. HEENT: [place] AT, moist mucus membranes.  Trachea midline Cardiovascular: No clubbing, cyanosis, or  edema. Respiratory: Normal respiratory effort, no increased work of breathing. Neurologic: Grossly intact, no focal deficits, moving all 4 extremities. Psychiatric: Normal mood and affect.  Laboratory Data: Lab Results Component Value Date WBC 7.9 09/05/2022 HGB 14.3 09/05/2022 HCT 42.6 09/05/2022 MCV 97.3 09/05/2022 PLT 236 09/05/2022   Lab Results Component Value Date CREATININE 0.92 09/05/2022   Lab Results Component Value Date AST 17 09/05/2022  Lab Results Component Value Date ALT 14 09/05/2022   Urinalysis   Component Value Date/Time COLORURINE STRAW (A) 09/06/2022 1509 APPEARANCEUR CLEAR (A) 09/06/2022 1509 APPEARANCEUR Clear 04/01/2022 1057 LABSPEC 1.004 (L) 09/06/2022 1509 PHURINE 6.0 09/06/2022 1509 GLUCOSEU NEGATIVE 09/06/2022 1509 HGBUR NEGATIVE 09/06/2022 1509 BILIRUBINUR NEGATIVE 09/06/2022 1509 BILIRUBINUR Negative 04/01/2022 1057 KETONESUR NEGATIVE 09/06/2022 1509 PROTEINUR NEGATIVE 09/06/2022 1509 NITRITE NEGATIVE 09/06/2022 1509 LEUKOCYTESUR NEGATIVE 09/06/2022 1509  Results for orders placed or performed in visit on 09/30/22  CULTURE, URINE COMPREHENSIVE   Specimen: Urine  UR  Result Value Ref Range   Urine Culture, Comprehensive Final report (A)    Organism ID, Bacteria Comment (A)    ANTIMICROBIAL SUSCEPTIBILITY Comment   Microscopic Examination   Urine  Result Value Ref Range   WBC, UA 0-5 0 - 5 /hpf   RBC, Urine 0-2 0 - 2 /hpf   Epithelial Cells (non renal) None seen 0 - 10 /hpf   Bacteria, UA None seen None seen/Few  Urinalysis, Complete  Result Value Ref Range   Specific Gravity, UA <1.005 (L) 1.005 - 1.030   pH, UA 5.0 5.0 - 7.5   Color, UA Yellow Yellow   Appearance Ur Clear Clear   Leukocytes,UA Negative Negative   Protein,UA Negative Negative/Trace   Glucose, UA Negative Negative   Ketones, UA Negative Negative   RBC, UA Negative Negative   Bilirubin, UA Negative  Negative   Urobilinogen, Ur 0.2 0.2 - 1.0 mg/dL   Nitrite, UA Negative Negative   Microscopic Examination See below:   Bladder Scan (Post Void Residual) in office  Result Value Ref Range   Scan Result 390   I have reviewed the labs.   Pertinent Imaging: 09/30/22 10:54 Scan Result 390   Assessment & Plan:    1. BPH with LUTS -PSA stable  -UA benign, pre-op urine culture sent  -PVR > 300 cc  -most bothersome symptoms are frequency, weak urinary stream and straining to urinate -continue conservative management, avoiding bladder irritants and timed voiding's -failed medical therapy -He will have an appointment next week with Dr. Apolinar Junes to discuss the HoLEP procedure  2. ED -newly widowed   Return for Appointment to discuss HoLEP .  These notes generated with voice recognition software. I apologize for typographical errors.  Cloretta Ned  [facility] [address]  [address] [place], [place] [address] [phone]

## 2022-09-30 ENCOUNTER — Ambulatory Visit (INDEPENDENT_AMBULATORY_CARE_PROVIDER_SITE_OTHER): Payer: Medicare HMO | Admitting: Urology

## 2022-09-30 ENCOUNTER — Encounter: Payer: Self-pay | Admitting: Urology

## 2022-09-30 VITALS — BP 157/82 | HR 61 | Ht 72.0 in | Wt 185.0 lb

## 2022-09-30 DIAGNOSIS — M9904 Segmental and somatic dysfunction of sacral region: Secondary | ICD-10-CM | POA: Diagnosis not present

## 2022-09-30 DIAGNOSIS — M9903 Segmental and somatic dysfunction of lumbar region: Secondary | ICD-10-CM | POA: Diagnosis not present

## 2022-09-30 DIAGNOSIS — N401 Enlarged prostate with lower urinary tract symptoms: Secondary | ICD-10-CM

## 2022-09-30 DIAGNOSIS — N5201 Erectile dysfunction due to arterial insufficiency: Secondary | ICD-10-CM

## 2022-09-30 DIAGNOSIS — M9905 Segmental and somatic dysfunction of pelvic region: Secondary | ICD-10-CM | POA: Diagnosis not present

## 2022-09-30 DIAGNOSIS — M9901 Segmental and somatic dysfunction of cervical region: Secondary | ICD-10-CM | POA: Diagnosis not present

## 2022-09-30 LAB — URINALYSIS, COMPLETE
Bilirubin, UA: NEGATIVE
Glucose, UA: NEGATIVE
Ketones, UA: NEGATIVE
Leukocytes,UA: NEGATIVE
Nitrite, UA: NEGATIVE
Protein,UA: NEGATIVE
RBC, UA: NEGATIVE
Specific Gravity, UA: <1.005 — ABNORMAL LOW (ref 1.005–1.030)
Urobilinogen, Ur: 0.2 mg/dL (ref 0.2–1.0)
pH, UA: 5 (ref 5.0–7.5)

## 2022-09-30 LAB — MICROSCOPIC EXAMINATION
Bacteria, UA: NONE SEEN
Epithelial Cells (non renal): NONE SEEN /hpf (ref 0–10)

## 2022-09-30 LAB — BLADDER SCAN AMB NON-IMAGING: Scan Result: 390

## 2022-10-05 ENCOUNTER — Telehealth (INDEPENDENT_AMBULATORY_CARE_PROVIDER_SITE_OTHER): Payer: Self-pay | Admitting: Nurse Practitioner

## 2022-10-05 NOTE — Telephone Encounter (Signed)
Patient stated that he called our office by mistake and meant to call his dermatologist.

## 2022-10-05 NOTE — Telephone Encounter (Signed)
Patient called in stating he had a rash on his chest bumpy kind of rash wanting to make an appt. Let patient know I would send this back to nurse and provider and let them talk patient may need to go and see PCP     Please call and advise

## 2022-10-06 ENCOUNTER — Ambulatory Visit: Payer: Medicare HMO | Admitting: Urology

## 2022-10-06 ENCOUNTER — Other Ambulatory Visit: Payer: Self-pay

## 2022-10-06 ENCOUNTER — Encounter: Payer: Self-pay | Admitting: Urology

## 2022-10-06 VITALS — BP 150/75 | HR 60 | Ht 72.0 in | Wt 185.0 lb

## 2022-10-06 DIAGNOSIS — N138 Other obstructive and reflux uropathy: Secondary | ICD-10-CM

## 2022-10-06 DIAGNOSIS — R339 Retention of urine, unspecified: Secondary | ICD-10-CM | POA: Diagnosis not present

## 2022-10-06 DIAGNOSIS — N401 Enlarged prostate with lower urinary tract symptoms: Secondary | ICD-10-CM

## 2022-10-06 NOTE — Patient Instructions (Signed)

## 2022-10-06 NOTE — H&P (View-Only) (Signed)
I,Amy L Pierron,acting as a scribe for Vanna Scotland, MD.,have documented all relevant documentation on the behalf of Vanna Scotland, MD,as directed by  Vanna Scotland, MD while in the presence of Vanna Scotland, MD.  10/06/2022 9:03 AM   [name] [birth year] [id number]  Referring provider: Marguarite Arbour, MD [address] [facility] [place],  [place] [address]  Chief Complaint  Patient presents with   Follow-up    Discuss surgery     HPI: 72 year-old male with a personal history of BPH presents today to discuss consideration of an outlet procedure.  He has a personal history of poorly controlled lower urinary tract symptoms. His most recent IPSS was 23/4, and an elevated PVR of 390. He's being managed on Finasteride and Tadalafil with refractory symptoms.   He had a cystoscopy in 2022 which showed coapting lateral lobes with intravesical protrusion and a small median lobe on retroflexion. His prostate volume was 91 cc's.  He reports nocturia x 2/3. He doesn't feel he completely empties his bladder and makes sure he is always near a restroom.    PMH: Past Medical History:  Diagnosis Date   Anxiety    Depression    GERD (gastroesophageal reflux disease)    Hyperlipidemia    Hypertension    Hypothyroidism     Surgical History: Past Surgical History:  Procedure Laterality Date   ABDOMINAL AORTIC ANEURYSM REPAIR     COLONOSCOPY WITH PROPOFOL N/A 05/10/2018   Procedure: COLONOSCOPY WITH PROPOFOL;  Surgeon: Midge Minium, MD;  Location: [facility] ENDOSCOPY;  Service: Endoscopy;  Laterality: N/A;   JOINT REPLACEMENT     TONSILLECTOMY AND ADENOIDECTOMY      Home Medications:  Allergies as of 10/06/2022       Reactions   Prednisone    Other reaction(s): Other (See Comments) irritiability        Medication List        Accurate as of October 06, 2022  9:03 AM. If you have any questions, ask your nurse or doctor.          STOP taking these  medications    LORazepam 0.5 MG tablet Commonly known as: ATIVAN       TAKE these medications    acidophilus Caps capsule Take 1 capsule by mouth daily.   clonazePAM 0.5 MG tablet Commonly known as: KLONOPIN Take 1 tablet (0.5 mg total) by mouth at bedtime.   famotidine 20 MG tablet Commonly known as: PEPCID Take 1 tablet (20 mg total) by mouth 2 (two) times daily.   finasteride 5 MG tablet Commonly known as: PROSCAR TAKE 1 TABLET EVERY DAY   Fish Oil 1000 MG Cpdr Take by mouth.   FLUoxetine 20 MG capsule Commonly known as: PROZAC Take 1 capsule (20 mg total) by mouth daily.   fluticasone 50 MCG/ACT nasal spray Commonly known as: FLONASE Place into the nose.   hydrOXYzine 25 MG tablet Commonly known as: ATARAX Take 1 tablet (25 mg total) by mouth 3 (three) times daily as needed for anxiety.   levothyroxine 50 MCG tablet Commonly known as: SYNTHROID Take 1 tablet (50 mcg total) by mouth daily at 6 (six) AM.   loratadine 10 MG tablet Commonly known as: CLARITIN Take 1 tablet (10 mg total) by mouth daily.   losartan 50 MG tablet Commonly known as: COZAAR Take 1 tablet (50 mg total) by mouth daily.   montelukast 10 MG tablet Commonly known as: SINGULAIR Take 10 mg by  mouth at bedtime.   multivitamin with minerals Tabs tablet Take 1 tablet by mouth daily.   pantoprazole 40 MG tablet Commonly known as: PROTONIX Take 2 tablets (80 mg total) by mouth 2 (two) times daily before a meal.   pravastatin 40 MG tablet Commonly known as: PRAVACHOL Take 1 tablet (40 mg total) by mouth daily at 6 PM.   tadalafil 5 MG tablet Commonly known as: CIALIS Take 1 tablet (5 mg total) by mouth daily as needed for erectile dysfunction.   white petrolatum Oint Commonly known as: VASELINE Apply 1 Application topically as needed (dry nares).        Allergies:  Allergies  Allergen Reactions   Prednisone     Other reaction(s): Other (See Comments) irritiability     Family History: Family History  Problem Relation Age of Onset   Varicose Veins Father    Heart attack Brother    Diabetes Brother     Social History:  reports that he has never smoked. He has never been exposed to tobacco smoke. He has never used smokeless tobacco. He reports current alcohol use. He reports that he does not use drugs.   Physical Exam: BP (!) 150/75   Pulse 60   Ht 6' (1.829 m)   Wt 185 lb (83.9 kg)   BMI 25.09 kg/m   Constitutional:  Alert and oriented, No acute distress. HEENT: [place] AT, moist mucus membranes.  Trachea midline, no masses. Neurologic: Grossly intact, no focal deficits, moving all 4 extremities. Psychiatric: Normal mood and affect.   Assessment & Plan:    BPH with outlet obstruction and incomplete bladder emptying  - We discussed alternatives including TURP vs. holmium laser enucleation of the prostate vs. greenlight laser ablation. Differences between the surgical procedures were discussed as well as the risks and benefits of each.  He is most interested in HoLEP.  - We discussed the common postoperative course following holep including need for overnight Foley catheter, temporary worsening of irritative voiding symptoms, and occasional stress incontinence which typically lasts up to 6 months but can persist.  We discussed retrograde ejaculation and damage to surrounding structures including the urinary sphincter. Other uncommon complications including hematuria and urinary tract infection. Risk of bleeding, infection, damage surrounding structures, injury to the bladder/ urethral, bladder neck contracture, ureteral stricture, retrograde ejaculation, stress/ urge incontinence, exacerbation of irritative voiding symptoms were all discussed in detail.    - He understands all of the above and is willing to proceed as planned. We reviewed the surgery in detail today including the preoperative, intraoperative, and postoperative course.  Aware he needs to  be NPO, have a driver, and someone to stay with him the first night.  He will go home with catheter for a few days post op and will either be taught how to remove his own catheter or return to the office for catheter removal.  - Gave information for him to review at home and the surgical scheduler will call him to set it up.  I have reviewed the above documentation for accuracy and completeness, and I agree with the above.   Vanna Scotland, MD    [facility] [address] [place], [place] [address] [phone]

## 2022-10-06 NOTE — Progress Notes (Unsigned)
Surgical Physician Order Form [facility]  * Scheduling expectation : Next Available  *Length of Case:   *Clearance needed: no  *Anticoagulation Instructions: Hold all anticoagulants  *Aspirin Instructions: Hold Aspirin  *Post-op visit Date/Instructions:  2 days foley removal, 6 weeks IPSS/ PVR  *Diagnosis: BPH w/urinary obstruction  *Procedure:  HOLEP (64403)   Additional orders: N/A  -Admit type: OUTpatient  -Anesthesia: General  -VTE Prophylaxis Standing Order SCD's       Other:   -Standing Lab Orders Per Anesthesia    Lab other: None  -Standing Test orders EKG/Chest x-ray per Anesthesia       Test other:   - Medications:  Ancef 2gm IV  -Other orders:  N/A

## 2022-10-06 NOTE — Progress Notes (Addendum)
I,Amy L Pierron,acting as a scribe for Vanna Scotland, MD.,have documented all relevant documentation on the behalf of Vanna Scotland, MD,as directed by  Vanna Scotland, MD while in the presence of Vanna Scotland, MD.  10/06/2022 9:03 AM   Mitchell Powell [birth year] [id number]  Referring provider: Marguarite Arbour, MD [address] [facility] [place],  [place] [address]  Chief Complaint  Patient presents with   Follow-up    Discuss surgery     HPI: 72 year-old male with a personal history of BPH presents today to discuss consideration of an outlet procedure.  He has a personal history of poorly controlled lower urinary tract symptoms. His most recent IPSS was 23/4, and an elevated PVR of 390. He's being managed on Finasteride and Tadalafil with refractory symptoms.   He had a cystoscopy in 2022 which showed coapting lateral lobes with intravesical protrusion and a small median lobe on retroflexion. His prostate volume was 91 cc's.  He reports nocturia x 2/3. He doesn't feel he completely empties his bladder and makes sure he is always near a restroom.    PMH: Past Medical History:  Diagnosis Date   Anxiety    Depression    GERD (gastroesophageal reflux disease)    Hyperlipidemia    Hypertension    Hypothyroidism     Surgical History: Past Surgical History:  Procedure Laterality Date   ABDOMINAL AORTIC ANEURYSM REPAIR     COLONOSCOPY WITH PROPOFOL N/A 05/10/2018   Procedure: COLONOSCOPY WITH PROPOFOL;  Surgeon: Midge Minium, MD;  Location: [facility] ENDOSCOPY;  Service: Endoscopy;  Laterality: N/A;   JOINT REPLACEMENT     TONSILLECTOMY AND ADENOIDECTOMY      Home Medications:  Allergies as of 10/06/2022       Reactions   Prednisone    Other reaction(s): Other (See Comments) irritiability        Medication List        Accurate as of October 06, 2022  9:03 AM. If you have any questions, ask your nurse or doctor.          STOP taking these  medications    LORazepam 0.5 MG tablet Commonly known as: ATIVAN       TAKE these medications    acidophilus Caps capsule Take 1 capsule by mouth daily.   clonazePAM 0.5 MG tablet Commonly known as: KLONOPIN Take 1 tablet (0.5 mg total) by mouth at bedtime.   famotidine 20 MG tablet Commonly known as: PEPCID Take 1 tablet (20 mg total) by mouth 2 (two) times daily.   finasteride 5 MG tablet Commonly known as: PROSCAR TAKE 1 TABLET EVERY DAY   Fish Oil 1000 MG Cpdr Take by mouth.   FLUoxetine 20 MG capsule Commonly known as: PROZAC Take 1 capsule (20 mg total) by mouth daily.   fluticasone 50 MCG/ACT nasal spray Commonly known as: FLONASE Place into the nose.   hydrOXYzine 25 MG tablet Commonly known as: ATARAX Take 1 tablet (25 mg total) by mouth 3 (three) times daily as needed for anxiety.   levothyroxine 50 MCG tablet Commonly known as: SYNTHROID Take 1 tablet (50 mcg total) by mouth daily at 6 (six) AM.   loratadine 10 MG tablet Commonly known as: CLARITIN Take 1 tablet (10 mg total) by mouth daily.   losartan 50 MG tablet Commonly known as: COZAAR Take 1 tablet (50 mg total) by mouth daily.   montelukast 10 MG tablet Commonly known as: SINGULAIR Take 10 mg by  mouth at bedtime.   multivitamin with minerals Tabs tablet Take 1 tablet by mouth daily.   pantoprazole 40 MG tablet Commonly known as: PROTONIX Take 2 tablets (80 mg total) by mouth 2 (two) times daily before a meal.   pravastatin 40 MG tablet Commonly known as: PRAVACHOL Take 1 tablet (40 mg total) by mouth daily at 6 PM.   tadalafil 5 MG tablet Commonly known as: CIALIS Take 1 tablet (5 mg total) by mouth daily as needed for erectile dysfunction.   white petrolatum Oint Commonly known as: VASELINE Apply 1 Application topically as needed (dry nares).        Allergies:  Allergies  Allergen Reactions   Prednisone     Other reaction(s): Other (See Comments) irritiability     Family History: Family History  Problem Relation Age of Onset   Varicose Veins Father    Heart attack Brother    Diabetes Brother     Social History:  reports that he has never smoked. He has never been exposed to tobacco smoke. He has never used smokeless tobacco. He reports current alcohol use. He reports that he does not use drugs.   Physical Exam: BP (!) 150/75   Pulse 60   Ht 6' (1.829 m)   Wt 185 lb (83.9 kg)   BMI 25.09 kg/m   Constitutional:  Alert and oriented, No acute distress. HEENT: [place] AT, moist mucus membranes.  Trachea midline, no masses. Neurologic: Grossly intact, no focal deficits, moving all 4 extremities. Psychiatric: Normal mood and affect.   Assessment & Plan:    BPH with outlet obstruction and incomplete bladder emptying  - We discussed alternatives including TURP vs. holmium laser enucleation of the prostate vs. greenlight laser ablation. Differences between the surgical procedures were discussed as well as the risks and benefits of each.  He is most interested in HoLEP.  - We discussed the common postoperative course following holep including need for overnight Foley catheter, temporary worsening of irritative voiding symptoms, and occasional stress incontinence which typically lasts up to 6 months but can persist.  We discussed retrograde ejaculation and damage to surrounding structures including the urinary sphincter. Other uncommon complications including hematuria and urinary tract infection. Risk of bleeding, infection, damage surrounding structures, injury to the bladder/ urethral, bladder neck contracture, ureteral stricture, retrograde ejaculation, stress/ urge incontinence, exacerbation of irritative voiding symptoms were all discussed in detail.    - He understands all of the above and is willing to proceed as planned. We reviewed the surgery in detail today including the preoperative, intraoperative, and postoperative course.  Aware he needs to  be NPO, have a driver, and someone to stay with him the first night.  He will go home with catheter for a few days post op and will either be taught how to remove his own catheter or return to the office for catheter removal.  - Gave information for him to review at home and the surgical scheduler will call him to set it up.  I have reviewed the above documentation for accuracy and completeness, and I agree with the above.   Vanna Scotland, MD    [facility] [address] [place], [place] [address] [phone]

## 2022-10-07 DIAGNOSIS — M9903 Segmental and somatic dysfunction of lumbar region: Secondary | ICD-10-CM | POA: Diagnosis not present

## 2022-10-07 DIAGNOSIS — M9901 Segmental and somatic dysfunction of cervical region: Secondary | ICD-10-CM | POA: Diagnosis not present

## 2022-10-07 DIAGNOSIS — B369 Superficial mycosis, unspecified: Secondary | ICD-10-CM | POA: Diagnosis not present

## 2022-10-07 DIAGNOSIS — M9904 Segmental and somatic dysfunction of sacral region: Secondary | ICD-10-CM | POA: Diagnosis not present

## 2022-10-07 DIAGNOSIS — M9905 Segmental and somatic dysfunction of pelvic region: Secondary | ICD-10-CM | POA: Diagnosis not present

## 2022-10-08 ENCOUNTER — Telehealth: Payer: Self-pay

## 2022-10-08 NOTE — Progress Notes (Signed)
   [facility] Surgical Posting Form  Surgery Date: Date: 11/02/2022  Surgeon: Dr. Vanna Scotland, MD  Inpt ( No  )   Outpt (Yes)   Obs ( No  )   Diagnosis: N40.1, N13.8 Benign Prostatic Hyperplasia with Urinary Obstruction  -CPT: [phone]  Surgery: Holmium Laser Enucleation of the Prostate  Stop Anticoagulations: Yes & will need to hold ASA as well  Cardiac/Medical/Pulmonary Clearance needed: no  *Orders entered into EPIC  Date: 10/08/22   *Case booked in [place]  Date: 10/07/2022  *Notified pt of Surgery: Date: 10/07/2022  PRE-OP UA & CX: no  *Placed into Prior Authorization Work [place] Date: 10/08/22  Assistant/laser/rep:No

## 2022-10-08 NOTE — Telephone Encounter (Signed)
  Per Dr. Apolinar Junes Patient is to be scheduled for Holmium Laser Enucleation of the Prostate   Mitchell Powell was contacted and possible surgical dates were discussed, Monday September 9th, 2024 was agreed upon for surgery.   Patient was directed to call [phone] between 1-3pm the day before surgery to find out surgical arrival time.  Instructions were given not to eat or drink from midnight on the night before surgery and have a driver for the day of surgery. On the surgery day patient was instructed to enter through the [facility] entrance of [facility] report the Same Day Surgery desk.   Pre-Admit Testing will be in contact via phone to set up an interview with the anesthesia team to review your history and medications prior to surgery.   Reminder of this information was sent via MyChart to the patient.

## 2022-10-09 DIAGNOSIS — F332 Major depressive disorder, recurrent severe without psychotic features: Secondary | ICD-10-CM | POA: Diagnosis not present

## 2022-10-14 DIAGNOSIS — M9905 Segmental and somatic dysfunction of pelvic region: Secondary | ICD-10-CM | POA: Diagnosis not present

## 2022-10-14 DIAGNOSIS — M9901 Segmental and somatic dysfunction of cervical region: Secondary | ICD-10-CM | POA: Diagnosis not present

## 2022-10-14 DIAGNOSIS — M9903 Segmental and somatic dysfunction of lumbar region: Secondary | ICD-10-CM | POA: Diagnosis not present

## 2022-10-14 DIAGNOSIS — M9904 Segmental and somatic dysfunction of sacral region: Secondary | ICD-10-CM | POA: Diagnosis not present

## 2022-10-15 DIAGNOSIS — F332 Major depressive disorder, recurrent severe without psychotic features: Secondary | ICD-10-CM | POA: Diagnosis not present

## 2022-10-16 DIAGNOSIS — M170 Bilateral primary osteoarthritis of knee: Secondary | ICD-10-CM | POA: Diagnosis not present

## 2022-10-16 DIAGNOSIS — Z96642 Presence of left artificial hip joint: Secondary | ICD-10-CM | POA: Diagnosis not present

## 2022-10-16 DIAGNOSIS — M5416 Radiculopathy, lumbar region: Secondary | ICD-10-CM | POA: Diagnosis not present

## 2022-10-21 DIAGNOSIS — M9905 Segmental and somatic dysfunction of pelvic region: Secondary | ICD-10-CM | POA: Diagnosis not present

## 2022-10-21 DIAGNOSIS — M9901 Segmental and somatic dysfunction of cervical region: Secondary | ICD-10-CM | POA: Diagnosis not present

## 2022-10-21 DIAGNOSIS — M9904 Segmental and somatic dysfunction of sacral region: Secondary | ICD-10-CM | POA: Diagnosis not present

## 2022-10-21 DIAGNOSIS — M9903 Segmental and somatic dysfunction of lumbar region: Secondary | ICD-10-CM | POA: Diagnosis not present

## 2022-10-27 ENCOUNTER — Encounter
Admission: RE | Admit: 2022-10-27 | Discharge: 2022-10-27 | Disposition: A | Discharge status: Home / Self Care | Payer: Medicare HMO | Source: Ambulatory Visit | Attending: Urology | Admitting: Urology

## 2022-10-27 ENCOUNTER — Other Ambulatory Visit: Payer: Self-pay

## 2022-10-27 HISTORY — DX: Sleep apnea, unspecified: G47.30

## 2022-10-27 NOTE — Patient Instructions (Signed)
Your procedure is scheduled on: Monday 11/02/22 To find out your arrival time, please call [phone] between 1PM - 3PM on:   Friday 10/30/22 Report to the Registration Desk on the 1st floor of the [facility]. FREE Valet parking is available.  If your arrival time is 6:00 am, do not arrive before that time as the [facility] entrance doors do not open until 6:00 am.  REMEMBER: Instructions that are not followed completely may result in serious medical risk, up to and including death; or upon the discretion of your surgeon and anesthesiologist your surgery may need to be rescheduled.  Do not eat food or drink any liquids after midnight the night before surgery.  No gum chewing or hard candies.  One week prior to surgery: Stop Anti-inflammatories (NSAIDS) such as Advil, Aleve, Ibuprofen, Motrin, Naproxen, Naprosyn and Aspirin based products such as Excedrin, Goody's Powder, BC Powder. You may however, continue to take Tylenol if needed for pain up until the day of surgery.  Stop ANY OVER THE COUNTER supplements and vitamins until after surgery. Stop multivitamin and fish oil today 10/27/22  Continue taking all prescribed medications with the exception of the following: tadalafil (CIALIS) last dose Friday 10/30/22 until after surgery.  TAKE ONLY THESE MEDICATIONS THE MORNING OF SURGERY WITH A SIP OF WATER:  famotidine (PEPCID) 20 MG tablet  finasteride (PROSCAR) 5 MG tablet  FLUoxetine (PROZAC) 20 MG capsule  levothyroxine (SYNTHROID) 50 MCG tablet  loratadine (CLARITIN) 10 MG tablet  pantoprazole (PROTONIX) 40 MG tablet  hydrOXYzine (ATARAX) 25 MG tablet if needed Take medications as usual the Sunday night  No Alcohol for 24 hours before or after surgery.  No Smoking including e-cigarettes for 24 hours before surgery.  No chewable tobacco products for at least 6 hours before surgery.  No nicotine patches on the day of surgery.  Do not use any "recreational" drugs for at least a  week (preferably 2 weeks) before your surgery.  Please be advised that the combination of cocaine and anesthesia may have negative outcomes, up to and including death. If you test positive for cocaine, your surgery will be cancelled.  On the morning of surgery brush your teeth with toothpaste and water, you may rinse your mouth with mouthwash if you wish. Do not swallow any toothpaste or mouthwash.  Use CHG Soap or wipes as directed on instruction sheet. Shower with your regular soap  Do not wear lotions, powders, or perfumes.   Do not shave body hair from the neck down 48 hours before surgery.  Wear comfortable clothing (specific to your surgery type) to the hospital.  Do not wear jewelry, make-up, hairpins, clips or nail polish.  Contact lenses, hearing aids and dentures may not be worn into surgery.  Bring your C-PAP to the hospital in case you may have to spend the night.   Do not bring valuables to the hospital. [facility] is not responsible for any missing/lost belongings or valuables.   Notify your doctor if there is any change in your medical condition (cold, fever, infection).  If you are being discharged the day of surgery, you will not be allowed to drive home. You will need a responsible individual to drive you home and stay with you for 24 hours after surgery.   If you are taking public transportation, you will need to have a responsible individual with you.  If you are being admitted to the hospital overnight, leave your suitcase in the car. After surgery it  may be brought to your room.  In case of increased patient census, it may be necessary for you, the patient, to continue your postoperative care in the Same Day Surgery department.  After surgery, you can help prevent lung complications by doing breathing exercises.  Take deep breaths and cough every 1-2 hours. Your doctor may order a device called an Incentive Spirometer to help you take deep breaths. When  coughing or sneezing, hold a pillow firmly against your incision with both hands. This is called "splinting." Doing this helps protect your incision. It also decreases belly discomfort.  Surgery Visitation Policy:  Patients undergoing a surgery or procedure may have two family members or support persons with them as long as the person is not COVID-19 positive or experiencing its symptoms.   Inpatient Visitation:    Visiting hours are 7 a.m. to 8 p.m. Up to four visitors are allowed at one time in a patient room. The visitors may rotate out with other people during the day. One designated support person (adult) may remain overnight.  Please call the Pre-admissions Testing Dept. at [phone] if you have any questions about these instructions.  Indwelling Urinary Catheter Care, Adult An indwelling urinary catheter is a thin, flexible tube that is placed into the bladder to help drain urine out of the body. The catheter is inserted into the urethra. The urethra is the part of the body that drains urine from the bladder. Urine drains from the catheter into a drainage bag outside of the body. Taking good care of your catheter will keep it working properly and help to prevent problems from developing. What are the risks? Bacteria may get into your bladder and cause a urinary tract infection. Urine flow can become blocked. This can happen if the catheter is not working correctly, or if you have sediment or a blood clot in your bladder or catheter. Tissue near the catheter may become irritated and may bleed. How to wear your catheter and your drainage bag Supplies needed Adhesive tape or a leg strap. Alcohol wipe or soap and water (if you use tape). A clean towel (if you use tape). Overnight drainage bag. Smaller drainage bag (leg bag). Wearing your catheter and bag Use adhesive tape or a leg strap to attach your catheter to your leg. Make sure the catheter is not pulled tight. If a leg  strap gets wet, replace it with a dry one. If you use adhesive tape: Use an alcohol wipe or soap and water to wash off any stickiness on your skin where you had tape before. Use a clean towel to pat-dry the area. Apply the new tape. You should have received a large overnight drainage bag and a smaller leg bag that fits underneath clothing. You may wear the overnight bag at any time, but you should not wear the leg bag at night. Make sure the overnight drainage bag is always lower than the level of your bladder, but do not let it touch the floor. Before you go to sleep, hang the bag inside a wastebasket that is covered by a clean plastic bag. Secure the leg bag according to manufacturer's instructions. This may be above or below the knee, depending on the length of the tubing. Make sure that: The leg bag is below the bladder. The tubing does not have loops or too much tension. How to care for the skin around the catheter Supplies needed A clean washcloth. Water and mild soap. A clean towel. Caring for  your skin and catheter     Every day, use a clean washcloth and soapy water to clean the skin around your catheter. Wash your hands with soap and water. Wet a washcloth in warm water and mild soap. Clean the skin around your urethra. If you are male: Use one hand to gently spread the folds of skin around your vagina (labia). With the washcloth in your other hand, wipe the inner side of your labia on each side. Do this in a front-to-back direction. If you are male: Use one hand to pull back any skin that covers the end of your penis (foreskin). With the washcloth in your other hand, wipe your penis in small circles. Start wiping at the tip of your penis, then move outward from the catheter. Move the foreskin back in place, if needed. With your free hand, hold the catheter close to where it enters your body. Keep holding the catheter during cleaning so it does not get pulled out. Use your  other hand to clean the catheter with the washcloth. Only wipe downward on the catheter, toward the bag. Do not wipe upward toward your body, because that may push bacteria into your urethra and cause infection. Use a clean towel to pat-dry the catheter and the skin around it. Make sure to wipe off all soap. Wash your hands with soap and water. Shower every day. Do not take baths. Do not use cream, ointment, or lotion on the area where the catheter enters your body, unless your health care provider tells you to do that. Do not use powders, sprays, or lotions on your genital area. Check your skin around the catheter every day for signs of infection. Check for: Redness, swelling, or pain. Fluid or blood. Warmth. Pus or a bad smell. How to empty the drainage bag Supplies needed Rubbing alcohol. Gauze pad or cotton ball. Adhesive tape or a leg strap. Emptying the bag Empty your drainage bag (your overnight drainage bag or your leg bag) when it is ?- full, or at least 2-3 times a day. Clean the drainage bag according to the manufacturer's instructions or as told by your health care provider. Wash your hands with soap and water. Detach the drainage bag from your leg. Hold the drainage bag over the toilet or a clean container. Make sure the drainage bag is lower than your hips and bladder. This stops urine from going back into the tubing and into your bladder. Open the pour spout at the bottom of the bag. Empty the urine into the toilet or container. Do not let the pour spout touch any surface. This precaution is important to prevent bacteria from getting in the bag and causing infection. Apply rubbing alcohol to a gauze pad or cotton ball. Use the gauze pad or cotton ball to clean the pour spout. Close the pour spout. Attach the bag to your leg with adhesive tape or a leg strap. Wash your hands with soap and water. How to change the drainage bag Supplies needed: Alcohol wipes. A clean  drainage bag. Adhesive tape or a leg strap. Changing the bag Replace your drainage bag with a clean bag if it leaks, starts to smell bad, or looks dirty. Wash your hands with soap and water. Detach the dirty drainage bag from your leg. Pinch the catheter with your fingers so that urine does not spill out. Disconnect the catheter tube from the drainage tube at the connection valve. Do not let the tubes touch any surface. Clean  the end of the catheter tube with an alcohol wipe. Use a different alcohol wipe to clean the end of the drainage tube. Connect the catheter tube to the drainage tube of the clean bag. Attach the clean bag to your leg with adhesive tape or a leg strap. Avoid attaching the new bag too tightly. Wash your hands with soap and water. General instructions  Never pull on your catheter or try to remove it. Pulling can damage your internal tissues. Always wash your hands before and after you handle your catheter or drainage bag. Use a mild, fragrance-free soap. If soap and water are not available, use hand sanitizer. Always make sure there are no twists, bends, or kinks in the catheter tube. Always make sure there are no leaks in the catheter or drainage bag. Drink enough fluid to keep your urine pale yellow. Do not take baths, swim, or use a hot tub. If you are male, wipe from front to back after having a bowel movement. Contact a health care provider if: Your catheter gets clogged. Your catheter starts to leak. You have signs of infection at the catheter site, such as: Redness, swelling, or pain where the catheter enters your body. Fluid, blood, pus, or a bad smell coming from the area where the catheter enters your body. The area where the catheter enters your body feels warm to the touch. You have signs of a urinary tract infection, such as: Fever or chills. Urine smells unusually bad. Cloudy urine. Pain in your abdomen, legs, lower back, or bladder. Nausea or  vomiting. Get help right away if: You see blood in the catheter. Your urine is pink or red. Your bladder feels full. Your urine is not draining into the bag. Your catheter gets pulled out. Summary An indwelling urinary catheter is a thin, flexible tube that is placed into the bladder to help drain urine out of the body. The catheter is inserted into the part of the body that drains urine from the bladder (urethra). Take good care of your catheter to keep it working properly and help prevent problems from developing. Always wash your hands before and after you handle your catheter or drainage bag. Never pull on your catheter or try to remove it. This information is not intended to replace advice given to you by your health care provider. Make sure you discuss any questions you have with your health care provider. Document Revised: 10/10/2020 Document Reviewed: 10/10/2020 Elsevier Patient Education  2023 [employer].

## 2022-10-28 DIAGNOSIS — E039 Hypothyroidism, unspecified: Secondary | ICD-10-CM | POA: Diagnosis not present

## 2022-10-28 DIAGNOSIS — F411 Generalized anxiety disorder: Secondary | ICD-10-CM | POA: Diagnosis not present

## 2022-10-28 DIAGNOSIS — Z23 Encounter for immunization: Secondary | ICD-10-CM | POA: Diagnosis not present

## 2022-10-28 DIAGNOSIS — M9905 Segmental and somatic dysfunction of pelvic region: Secondary | ICD-10-CM | POA: Diagnosis not present

## 2022-10-28 DIAGNOSIS — Z79899 Other long term (current) drug therapy: Secondary | ICD-10-CM | POA: Diagnosis not present

## 2022-10-28 DIAGNOSIS — M9904 Segmental and somatic dysfunction of sacral region: Secondary | ICD-10-CM | POA: Diagnosis not present

## 2022-10-28 DIAGNOSIS — M9901 Segmental and somatic dysfunction of cervical region: Secondary | ICD-10-CM | POA: Diagnosis not present

## 2022-10-28 DIAGNOSIS — E782 Mixed hyperlipidemia: Secondary | ICD-10-CM | POA: Diagnosis not present

## 2022-10-28 DIAGNOSIS — I10 Essential (primary) hypertension: Secondary | ICD-10-CM | POA: Diagnosis not present

## 2022-10-28 DIAGNOSIS — M9903 Segmental and somatic dysfunction of lumbar region: Secondary | ICD-10-CM | POA: Diagnosis not present

## 2022-10-29 DIAGNOSIS — F332 Major depressive disorder, recurrent severe without psychotic features: Secondary | ICD-10-CM | POA: Diagnosis not present

## 2022-10-30 DIAGNOSIS — J301 Allergic rhinitis due to pollen: Secondary | ICD-10-CM | POA: Diagnosis not present

## 2022-10-30 DIAGNOSIS — J328 Other chronic sinusitis: Secondary | ICD-10-CM | POA: Diagnosis not present

## 2022-11-02 ENCOUNTER — Other Ambulatory Visit: Payer: Self-pay

## 2022-11-02 ENCOUNTER — Ambulatory Visit: Payer: Medicare HMO | Admitting: Urgent Care

## 2022-11-02 ENCOUNTER — Ambulatory Visit
Admission: RE | Admit: 2022-11-02 | Discharge: 2022-11-02 | Disposition: A | Discharge status: Home / Self Care | Payer: Medicare HMO | Attending: Urology | Admitting: Urology

## 2022-11-02 ENCOUNTER — Encounter
Admission: RE | Disposition: A | Discharge status: Home / Self Care | Payer: Self-pay | Source: Home / Self Care | Attending: Urology

## 2022-11-02 ENCOUNTER — Ambulatory Visit: Payer: Medicare HMO | Admitting: Anesthesiology

## 2022-11-02 ENCOUNTER — Encounter: Payer: Self-pay | Admitting: Urology

## 2022-11-02 DIAGNOSIS — R338 Other retention of urine: Secondary | ICD-10-CM | POA: Diagnosis not present

## 2022-11-02 DIAGNOSIS — N401 Enlarged prostate with lower urinary tract symptoms: Secondary | ICD-10-CM | POA: Diagnosis not present

## 2022-11-02 DIAGNOSIS — N320 Bladder-neck obstruction: Secondary | ICD-10-CM | POA: Diagnosis not present

## 2022-11-02 DIAGNOSIS — N138 Other obstructive and reflux uropathy: Secondary | ICD-10-CM

## 2022-11-02 DIAGNOSIS — N139 Obstructive and reflux uropathy, unspecified: Secondary | ICD-10-CM | POA: Diagnosis not present

## 2022-11-02 HISTORY — PX: HOLEP-LASER ENUCLEATION OF THE PROSTATE WITH MORCELLATION: SHX6641

## 2022-11-02 SURGERY — ENUCLEATION, PROSTATE, USING LASER, WITH MORCELLATION
Anesthesia: General | Site: Prostate

## 2022-11-02 MED ORDER — OXYCODONE-ACETAMINOPHEN 5-325 MG PO TABS
1.0000 | ORAL_TABLET | ORAL | 0 refills | Status: DC | PRN
Start: 2022-11-02 — End: 2022-12-15

## 2022-11-02 MED ORDER — ONDANSETRON HCL 4 MG/2ML IJ SOLN
INTRAMUSCULAR | Status: AC
  Filled 2022-11-02: qty 2

## 2022-11-02 MED ORDER — ONDANSETRON HCL 4 MG/2ML IJ SOLN
INTRAMUSCULAR | Status: DC | PRN
  Administered 2022-11-02: 4 mg via INTRAVENOUS

## 2022-11-02 MED ORDER — SUCCINYLCHOLINE CHLORIDE 200 MG/10ML IV SOSY
PREFILLED_SYRINGE | INTRAVENOUS | Status: AC
  Filled 2022-11-02: qty 10

## 2022-11-02 MED ORDER — ROCURONIUM BROMIDE 10 MG/ML (PF) SYRINGE
PREFILLED_SYRINGE | INTRAVENOUS | Status: AC
  Filled 2022-11-02: qty 10

## 2022-11-02 MED ORDER — OXYCODONE HCL 5 MG PO TABS: 5.0000 mg | ORAL_TABLET | Freq: Once | ORAL | Status: DC | PRN

## 2022-11-02 MED ORDER — PROPOFOL 10 MG/ML IV BOLUS
INTRAVENOUS | Status: AC
  Filled 2022-11-02: qty 20

## 2022-11-02 MED ORDER — SODIUM CHLORIDE 0.9 % IR SOLN
Status: DC | PRN
Start: 2022-11-02 — End: 2022-11-02
  Administered 2022-11-02 (×6): 3000 mL
  Administered 2022-11-02: 1000 mL

## 2022-11-02 MED ORDER — LIDOCAINE HCL (PF) 2 % IJ SOLN
INTRAMUSCULAR | Status: AC
  Filled 2022-11-02: qty 5

## 2022-11-02 MED ORDER — OXYBUTYNIN CHLORIDE 5 MG PO TABS: 5.0000 mg | ORAL_TABLET | Freq: Three times a day (TID) | ORAL | 0 refills | Status: DC | PRN

## 2022-11-02 MED ORDER — PROPOFOL 10 MG/ML IV BOLUS
INTRAVENOUS | Status: DC | PRN
  Administered 2022-11-02: 150 mg via INTRAVENOUS

## 2022-11-02 MED ORDER — FUROSEMIDE 10 MG/ML IJ SOLN
INTRAMUSCULAR | Status: DC | PRN
Start: 2022-11-02 — End: 2022-11-02
  Administered 2022-11-02: 10 mg via INTRAMUSCULAR

## 2022-11-02 MED ORDER — LACTATED RINGERS IV SOLN: INTRAVENOUS | Status: DC

## 2022-11-02 MED ORDER — CEFAZOLIN SODIUM-DEXTROSE 2-4 GM/100ML-% IV SOLN
2.0000 g | INTRAVENOUS | Status: AC
  Administered 2022-11-02: 2 g via INTRAVENOUS

## 2022-11-02 MED ORDER — MIDAZOLAM HCL 2 MG/2ML IJ SOLN
INTRAMUSCULAR | Status: DC | PRN
  Administered 2022-11-02: 2 mg via INTRAVENOUS

## 2022-11-02 MED ORDER — CEFAZOLIN SODIUM-DEXTROSE 2-4 GM/100ML-% IV SOLN
INTRAVENOUS | Status: AC
  Filled 2022-11-02: qty 100

## 2022-11-02 MED ORDER — FENTANYL CITRATE (PF) 100 MCG/2ML IJ SOLN
INTRAMUSCULAR | Status: AC
  Filled 2022-11-02: qty 2

## 2022-11-02 MED ORDER — ROCURONIUM BROMIDE 100 MG/10ML IV SOLN
INTRAVENOUS | Status: DC | PRN
  Administered 2022-11-02: 20 mg via INTRAVENOUS

## 2022-11-02 MED ORDER — MIDAZOLAM HCL 2 MG/2ML IJ SOLN
INTRAMUSCULAR | Status: AC
  Filled 2022-11-02: qty 2

## 2022-11-02 MED ORDER — DEXAMETHASONE SODIUM PHOSPHATE 10 MG/ML IJ SOLN
INTRAMUSCULAR | Status: DC | PRN
  Administered 2022-11-02: 10 mg via INTRAVENOUS

## 2022-11-02 MED ORDER — FENTANYL CITRATE (PF) 100 MCG/2ML IJ SOLN: 25.0000 ug | INTRAMUSCULAR | Status: DC | PRN

## 2022-11-02 MED ORDER — FENTANYL CITRATE (PF) 100 MCG/2ML IJ SOLN
INTRAMUSCULAR | Status: DC | PRN
  Administered 2022-11-02 (×2): 50 ug via INTRAVENOUS

## 2022-11-02 MED ORDER — CHLORHEXIDINE GLUCONATE 0.12 % MT SOLN
OROMUCOSAL | Status: AC
  Filled 2022-11-02: qty 15

## 2022-11-02 MED ORDER — SUCCINYLCHOLINE CHLORIDE 200 MG/10ML IV SOSY
PREFILLED_SYRINGE | INTRAVENOUS | Status: DC | PRN
  Administered 2022-11-02: 120 mg via INTRAVENOUS

## 2022-11-02 MED ORDER — CHLORHEXIDINE GLUCONATE 0.12 % MT SOLN
15.0000 mL | Freq: Once | OROMUCOSAL | Status: AC
  Administered 2022-11-02: 15 mL via OROMUCOSAL

## 2022-11-02 MED ORDER — FUROSEMIDE 10 MG/ML IJ SOLN
INTRAMUSCULAR | Status: AC
  Filled 2022-11-02: qty 4

## 2022-11-02 MED ORDER — OXYCODONE HCL 5 MG/5ML PO SOLN: 5.0000 mg | Freq: Once | ORAL | Status: DC | PRN

## 2022-11-02 MED ORDER — DEXAMETHASONE SODIUM PHOSPHATE 10 MG/ML IJ SOLN
INTRAMUSCULAR | Status: AC
  Filled 2022-11-02: qty 1

## 2022-11-02 MED ORDER — ONDANSETRON HCL 4 MG/2ML IJ SOLN: 4.0000 mg | Freq: Once | INTRAMUSCULAR | Status: DC | PRN

## 2022-11-02 MED ORDER — SUGAMMADEX SODIUM 200 MG/2ML IV SOLN
INTRAVENOUS | Status: DC | PRN
  Administered 2022-11-02: 200 mg via INTRAVENOUS

## 2022-11-02 MED ORDER — ACETAMINOPHEN 10 MG/ML IV SOLN: 1000.0000 mg | Freq: Once | INTRAVENOUS | Status: DC | PRN

## 2022-11-02 MED ORDER — ORAL CARE MOUTH RINSE: 15.0000 mL | Freq: Once | OROMUCOSAL | Status: AC

## 2022-11-02 MED ORDER — STERILE WATER FOR IRRIGATION IR SOLN
Status: DC | PRN
  Administered 2022-11-02: 1000 mL

## 2022-11-02 MED ORDER — LIDOCAINE HCL (CARDIAC) PF 100 MG/5ML IV SOSY
PREFILLED_SYRINGE | INTRAVENOUS | Status: DC | PRN
  Administered 2022-11-02: 100 mg via INTRAVENOUS

## 2022-11-02 SURGICAL SUPPLY — 36 items
ADAPTER IRRIG TUBE 2 SPIKE SOL (ADAPTER) ×2 IMPLANT
ADPR TBG 2 SPK PMP STRL ASCP (ADAPTER) ×2
BAG DRN LRG CPC RND TRDRP CNTR (MISCELLANEOUS)
BAG DRN RND TRDRP ANRFLXCHMBR (UROLOGICAL SUPPLIES)
BAG URINE DRAIN 2000ML AR STRL (UROLOGICAL SUPPLIES) IMPLANT
BAG URO DRAIN 4000ML (MISCELLANEOUS) IMPLANT
CATH FOL 2WAY LX 20X30 (CATHETERS) IMPLANT
CATH FOL 2WAY LX 22X30 (CATHETERS) IMPLANT
CATH FOLEY 3WAY 30CC 22FR (CATHETERS) IMPLANT
CATH URETL OPEN END 4X70 (CATHETERS) ×1 IMPLANT
CONTAINER COLLECT MORCELLATR (MISCELLANEOUS) ×1 IMPLANT
DRAPE SHEET LG 3/4 BI-LAMINATE (DRAPES) ×1 IMPLANT
DRAPE UTILITY 15X26 TOWEL STRL (DRAPES) IMPLANT
FIBER LASER MOSES 550 DFL (Laser) ×1 IMPLANT
FILTER OVERFLOW MORCELLATOR (FILTER) ×1 IMPLANT
GLOVE BIO SURGEON STRL SZ 6.5 (GLOVE) ×2 IMPLANT
GOWN STRL REUS W/ TWL LRG LVL3 (GOWN DISPOSABLE) ×2 IMPLANT
GOWN STRL REUS W/TWL LRG LVL3 (GOWN DISPOSABLE) ×2
HOLDER FOLEY CATH W/STRAP (MISCELLANEOUS) ×1 IMPLANT
IV NS IRRIG 3000ML ARTHROMATIC (IV SOLUTION) ×4 IMPLANT
KIT TURNOVER CYSTO (KITS) ×1 IMPLANT
MBRN O SEALING YLW 17 FOR INST (MISCELLANEOUS) ×1
MEMBRANE SLNG YLW 17 FOR INST (MISCELLANEOUS) ×1 IMPLANT
MORCELLATOR COLLECT CONTAINER (MISCELLANEOUS) ×1
MORCELLATOR OVERFLOW FILTER (FILTER) ×1
MORCELLATOR ROTATION 4.75 335 (MISCELLANEOUS) ×1 IMPLANT
PACK CYSTO AR (MISCELLANEOUS) ×1 IMPLANT
SET CYSTO W/LG BORE CLAMP LF (SET/KITS/TRAYS/PACK) IMPLANT
SET IRRIG Y TYPE TUR BLADDER L (SET/KITS/TRAYS/PACK) ×1 IMPLANT
SLEEVE PROTECTION STRL DISP (MISCELLANEOUS) ×2 IMPLANT
SURGILUBE 2OZ TUBE FLIPTOP (MISCELLANEOUS) ×1 IMPLANT
SYR TOOMEY IRRIG 70ML (MISCELLANEOUS) ×1
SYRINGE TOOMEY IRRIG 70ML (MISCELLANEOUS) ×1 IMPLANT
TUBE PUMP MORCELLATOR PIRANHA (TUBING) ×1 IMPLANT
WATER STERILE IRR 1000ML POUR (IV SOLUTION) ×1 IMPLANT
WATER STERILE IRR 500ML POUR (IV SOLUTION) ×1 IMPLANT

## 2022-11-02 NOTE — Interval H&P Note (Signed)
History and Physical Interval Note:  11/02/2022 8:20 AM  Mitchell Powell  has presented today for surgery, with the diagnosis of Benign Prostatic Hyperplasia with Urinary Obstruction.  The various methods of treatment have been discussed with the patient and family. After consideration of risks, benefits and other options for treatment, the patient has consented to  Procedure(s): HOLEP-LASER ENUCLEATION OF THE PROSTATE WITH MORCELLATION (N/A) as a surgical intervention.  The patient's history has been reviewed, patient examined, no change in status, stable for surgery.  I have reviewed the patient's chart and labs.  Questions were answered to the patient's satisfaction.    RRR CTAB  Vanna Scotland

## 2022-11-02 NOTE — Anesthesia Postprocedure Evaluation (Signed)
Anesthesia Post Note  Patient: Mitchell Powell  Procedure(s) Performed: HOLEP-LASER ENUCLEATION OF THE PROSTATE WITH MORCELLATION (Prostate)  Patient location during evaluation: PACU Anesthesia Type: General Level of consciousness: awake and alert Pain management: pain level controlled Vital Signs Assessment: post-procedure vital signs reviewed and stable Respiratory status: spontaneous breathing, nonlabored ventilation, respiratory function stable and patient connected to nasal cannula oxygen Cardiovascular status: blood pressure returned to baseline and stable Postop Assessment: no apparent nausea or vomiting Anesthetic complications: no   No notable events documented.   Last Vitals:  Vitals:   11/02/22 1115 11/02/22 1123  BP: (!) 154/77 (!) 174/83  Pulse: 61 (!) 56  Resp: 19 18  Temp: (!) 36.3 C (!) 36 C  SpO2: 95% 95%    Last Pain:  Vitals:   11/02/22 1123  TempSrc: Temporal  PainSc: 2                  Corinda Gubler

## 2022-11-02 NOTE — Anesthesia Preprocedure Evaluation (Signed)
Anesthesia Evaluation  Patient identified by MRN, date of birth, ID band Patient awake    Reviewed: Allergy & Precautions, NPO status , Patient's Chart, lab work & pertinent test results  History of Anesthesia Complications Negative for: history of anesthetic complications  Airway Mallampati: III  TM Distance: >3 FB Neck ROM: Full    Dental  (+) Chipped   Pulmonary sleep apnea and Continuous Positive Airway Pressure Ventilation , COPD, Patient abstained from smoking.Not current smoker, former smoker   Pulmonary exam normal breath sounds clear to auscultation       Cardiovascular Exercise Tolerance: Good METShypertension, (-) CAD and (-) Past MI (-) dysrhythmias  Rhythm:Regular Rate:Normal - Systolic murmurs    Neuro/Psych  PSYCHIATRIC DISORDERS Anxiety Depression       GI/Hepatic ,GERD  Controlled and Medicated,,(+)     (-) substance abuse    Endo/Other  neg diabetesHypothyroidism    Renal/GU negative Renal ROS     Musculoskeletal   Abdominal   Peds  Hematology   Anesthesia Other Findings Past Medical History: No date: Anxiety No date: Depression No date: GERD (gastroesophageal reflux disease) No date: Hyperlipidemia No date: Hypertension No date: Hypothyroidism No date: Sleep apnea  Reproductive/Obstetrics                              Anesthesia Physical Anesthesia Plan  ASA: 2  Anesthesia Plan: General   Post-op Pain Management: Ofirmev IV (intra-op)*   Induction: Intravenous  PONV Risk Score and Plan: 3 and Ondansetron and Dexamethasone  Airway Management Planned: Oral ETT  Additional Equipment: None  Intra-op Plan:   Post-operative Plan: Extubation in OR  Informed Consent: I have reviewed the patients History and Physical, chart, labs and discussed the procedure including the risks, benefits and alternatives for the proposed anesthesia with the patient or  authorized representative who has indicated his/her understanding and acceptance.     Dental advisory given  Plan Discussed with: CRNA and Surgeon  Anesthesia Plan Comments: (Discussed risks of anesthesia with patient, including PONV, sore throat, lip/dental/eye damage. Rare risks discussed as well, such as cardiorespiratory and neurological sequelae, and allergic reactions. Discussed the role of CRNA in patient's perioperative care. Patient understands.)         Anesthesia Quick Evaluation

## 2022-11-02 NOTE — Transfer of Care (Signed)
Immediate Anesthesia Transfer of Care Note  Patient: Mitchell Powell  Procedure(s) Performed: HOLEP-LASER ENUCLEATION OF THE PROSTATE WITH MORCELLATION (Prostate)  Patient Location: PACU  Anesthesia Type:General  Level of Consciousness: awake and alert   Airway & Oxygen Therapy: Patient Spontanous Breathing and Patient connected to face mask oxygen  Post-op Assessment: Report given to RN and Post -op Vital signs reviewed and stable  Post vital signs: Reviewed and stable  Last Vitals:  Vitals Value Taken Time  BP 139/75 11/02/22 1045  Temp    Pulse 64 11/02/22 1048  Resp 14 11/02/22 1048  SpO2 100 % 11/02/22 1048  Vitals shown include unfiled device data.  Last Pain:  Vitals:   11/02/22 0736  TempSrc: Temporal  PainSc: 0-No pain         Complications: No notable events documented.

## 2022-11-02 NOTE — Op Note (Signed)
Date of procedure: 11/02/22  Preoperative diagnosis:  BPH with BOO  Postoperative diagnosis:  same   Procedure: HoLEP with morcellation  Surgeon: Vanna Scotland, MD  Anesthesia: General  Complications: None  Intraoperative findings: Significantly elevated bladder neck without a discrete median lobe, kissing lateral lobes.  Trabeculated bladder.  EBL: Minimal  Specimens: Prostate tissue  Drains: 20 French two-way Foley catheter with 50 cc in the balloon  Indication: Mitchell Powell is a 72 y.o. patient with BPH with outlet obstruction incomplete bladder emptying.  After reviewing the management options for treatment, he elected to proceed with the above surgical procedure(s). We have discussed the potential benefits and risks of the procedure, side effects of the proposed treatment, the likelihood of the patient achieving the goals of the procedure, and any potential problems that might occur during the procedure or recuperation. Informed consent has been obtained.  Description of procedure:  The patient was taken to the operating room and general anesthesia was induced.  The patient was placed in the dorsal lithotomy position, prepped and draped in the usual sterile fashion, and preoperative antibiotics were administered. A preoperative time-out was performed.    Male sounds were used to calibrate the urethra.  A 26 French resectoscope sheath using a blunt angled obturator was introduced without difficulty into the bladder.  The bladder was carefully inspected and noted to be moderately trabeculated.  The bladder neck itself was quite elevated but there was not discrete median lobe, rather was felt to be kissing lateral lobes with intravesical protrusion.  The trigone was able to be visualized with some manipulation and the UOs were good distance from bladder neck itself.  The prostatic fossa had significant trilobar coaptation with greater than 5 cm prostatic length.  A 500 m laser  fiber was then brought in and using settings of 2 J's and 60 Hz, an incision was made at the 6 o'clock position down to the bladder neck fibers.  This is carried down just proximal to the verumontanum.  I then created 2 semilunar incisions near the apex of the prostate again with care to avoid any resection beyond the veru.  Next, in a caudal to cranial direction, I began cleaving the adenoma off the underlying capsule rolling it towards the bladder neck and ultimately cleaving the mucosa.  I did this on both sides and ultimately was able to push all of the adenoma en bloc after was finally freed from the bladder neck into the bladder.   Ultimately, I was able to complete the anterior commissure mucosa and the adenoma into the bladder creating a widely patent prostatic fossa.  A few small residual nodules were then enucleated/ablated in a piece wise fashion.     Hemostasis was achieved using hemostatic fiber settings.  Bilateral UOs were visualized and free of any injury.  Finally, the 77 French resectoscope was exchanged for nephroscope and using the Piranha handpiece morcellator, the bladder was distended in each of the prostate chips were evacuated.  The bladder was irrigated several times and smaller joules were clear for the bladder.  This point time, there were no residual fibers appreciated in the bladder.  Hemostasis was adequate.  10 mg of IV Lasix was administered to help with postoperative diuresis.  A 20 French two-way Foley catheter was then inserted over a catheter guide with 50 cc in the balloon.  The catheter irrigated easily and well.  Patient was then clean and dry, repositioned supine position, reversed from anesthesia, taken to  PACU in stable condition.   Plan: Patient will return to the office in 2 days for voiding trial.   Follow-up thereafter in 6 weeks with IPSS/PVR.    Vanna Scotland, M.D.

## 2022-11-02 NOTE — Group Note (Deleted)

## 2022-11-02 NOTE — Discharge Instructions (Addendum)
Holmium Laser Enucleation of the Prostate (HoLEP)  HoLEP is a treatment for men with benign prostatic hyperplasia (BPH). The laser surgery removed blockages of urine flow, and is done without any incisions on the body.     What is HoLEP?  HoLEP is a type of laser surgery used to treat obstruction (blockage) of urine flow as a result of benign prostatic hyperplasia (BPH). In men with BPH, the prostate gland is not cancerous, but has become enlarged. An enlarged prostate can result in a number of urinary tract symptoms such as weak urinary stream, difficulty in starting urination, inability to urinate, frequent urination, or getting up at night to urinate.  HoLEP was developed in the 1990's as a more effective and less expensive surgical option for BPH, compared to other surgical options such as laser vaporization(PVP/greenlight laser), transurethral resection of the prostate(TURP), and open simple prostatectomy.   What happens during a HoLEP?  HoLEP requires general anesthesia ("asleep" throughout the procedure).   An antibiotic is given to reduce the risk of infection  A surgical instrument called a resectoscope is inserted through the urethra (the tube that carries urine from the bladder). The resectoscope has a camera that allows the surgeon to view the internal structure of the prostate gland, and to see where the incisions are being made during surgery.  The laser is inserted into the resectoscope and is used to enucleate (free up) the enlarged prostate tissue from the capsule (outer shell) and then to seal up any blood vessels. The tissue that has been removed is pushed back into the bladder.  A morcellator is placed through the resectoscope, and is used to suction out the prostate tissue that has been pushed into the bladder.  When the prostate tissue has been removed, the resectoscope is removed, and a foley catheter is placed to allow healing and drain the urine from the  bladder.     What happens after a HoLEP?  More than 90% of patients go home the same day a few hours after surgery. Less than 10% will be admitted to the hospital overnight for observation to monitor the urine, or if they have other medical problems.  Fluid is flushed through the catheter for about 1 hour after surgery to clear any blood from the urine. It is normal to have some blood in the urine after surgery. The need for blood transfusion is extremely rare.  Eating and drinking are permitted after the procedure once the patient has fully awakened from anesthesia.  The catheter is usually removed 2-3 days after surgery- the patient will come to clinic to have the catheter removed and make sure they can urinate on their own.  It is very important to drink lots of fluids after surgery for one week to keep the bladder flushed.  At first, there may be some burning with urination, but this typically improved within a few hours to days. Most patients do not have a significant amount of pain, and narcotic pain medications are rarely needed.  Symptoms of urinary frequency, urgency, and even leakage are NORMAL for the first few weeks after surgery as the bladder adjusts after having to work hard against blockage from the prostate for many years. This will improve, but can sometimes take several months.  The use of pelvic floor exercises (Kegel exercises) can help improve problems with urinary incontinence.   After catheter removal, patients will be seen at 6 weeks and 6 months for symptom check  No heavy lifting for   at least 2-3 weeks after surgery, however patients can walk and do light activities the first day after surgery. Return to work time depends on occupation.    What are the advantages of HoLEP?  HoLEP has been studied in many different parts of the world and has been shown to be a safe and effective procedure. Although there are many types of BPH surgeries available, HoLEP offers a  unique advantage in being able to remove a large amount of tissue without any incisions on the body, even in very large prostates, while decreasing the risk of bleeding and providing tissue for pathology (to look for cancer). This decreases the need for blood transfusions during surgery, minimizes hospital stay, and reduces the risk of needing repeat treatment.  What are the side effects of HoLEP?  Temporary burning and bleeding during urination. Some blood may be seen in the urine for weeks after surgery and is part of the healing process.  Urinary incontinence (inability to control urine flow) is expected in all patients immediately after surgery and they should wear pads for the first few days/weeks. This typically improves over the course of several weeks. Performing Kegel exercises can help decrease leakage from stress maneuvers such as coughing, sneezing, or lifting. The rate of long term leakage is very low. Patients may also have leakage with urgency and this may be treated with medication. The risk of urge incontinence can be dependent on several factors including age, prostate size, symptoms, and other medical problems.  Retrograde ejaculation or "backwards ejaculation." In 75% of cases, the patient will not see any fluid during ejaculation after surgery.  Erectile function is generally not significantly affected.   What are the risks of HoLEP?  Injury to the urethra or development of scar tissue at a later date  Injury to the capsule of the prostate (typically treated with longer catheterization).  Injury to the bladder or ureteral orifices (where the urine from the kidney drains out)  Infection of the bladder, testes, or kidneys  Return of urinary obstruction at a later date requiring another operation (<2%)  Need for blood transfusion or re-operation due to bleeding  Failure to relieve all symptoms and/or need for prolonged catheterization after surgery  5-15% of patients are  found to have previously undiagnosed prostate cancer in their specimen. Prostate cancer can be treated after HoLEP.  Standard risks of anesthesia including blood clots, heart attacks, etc  When should I call my doctor?  Fever over 101.3 degrees  Inability to urinate, or large blood clots in the urine  AMBULATORY SURGERY  DISCHARGE INSTRUCTIONS   The drugs that you were given will stay in your system until tomorrow so for the next 24 hours you should not:  Drive an automobile Make any legal decisions Drink any alcoholic beverage   You may resume regular meals tomorrow.  Today it is better to start with liquids and gradually work up to solid foods.  You may eat anything you prefer, but it is better to start with liquids, then soup and crackers, and gradually work up to solid foods.   Please notify your doctor immediately if you have any unusual bleeding, trouble breathing, redness and pain at the surgery site, drainage, fever, or pain not relieved by medication.    Additional Instructions:        Please contact your physician with any problems or Same Day Surgery at [phone], Monday through Friday 6 am to 4 pm, or [facility] [place] Main number   at [phone].  

## 2022-11-02 NOTE — Anesthesia Procedure Notes (Signed)
Procedure Name: Intubation Date/Time: 11/02/2022 9:33 AM  Performed by: Berniece Pap, CRNAPre-anesthesia Checklist: Patient identified, Emergency Drugs available, Suction available and Patient being monitored Patient Re-evaluated:Patient Re-evaluated prior to induction Oxygen Delivery Method: Circle system utilized Preoxygenation: Pre-oxygenation with 100% oxygen Induction Type: IV induction Ventilation: Mask ventilation without difficulty Laryngoscope Size: McGraph and 4 Grade View: Grade I Tube type: Oral Tube size: 7.5 mm Number of attempts: 1 Airway Equipment and Method: Stylet and Oral airway Placement Confirmation: ETT inserted through vocal cords under direct vision, positive ETCO2 and breath sounds checked- equal and bilateral Secured at: 21 cm Tube secured with: Tape Dental Injury: Teeth and Oropharynx as per pre-operative assessment

## 2022-11-03 ENCOUNTER — Encounter: Payer: Self-pay | Admitting: Urology

## 2022-11-03 NOTE — Telephone Encounter (Signed)
To clarify the message, he received warning that he should not take Hydroxyzine and Oxybutynin together and would like our feedback

## 2022-11-04 ENCOUNTER — Ambulatory Visit (INDEPENDENT_AMBULATORY_CARE_PROVIDER_SITE_OTHER): Payer: Medicare HMO | Admitting: Physician Assistant

## 2022-11-04 ENCOUNTER — Encounter: Payer: Self-pay | Admitting: Physician Assistant

## 2022-11-04 VITALS — BP 119/69 | HR 60 | Wt 195.0 lb

## 2022-11-04 DIAGNOSIS — N138 Other obstructive and reflux uropathy: Secondary | ICD-10-CM

## 2022-11-04 DIAGNOSIS — N401 Enlarged prostate with lower urinary tract symptoms: Secondary | ICD-10-CM

## 2022-11-04 NOTE — Progress Notes (Signed)
Catheter Removal  Patient is present today for a catheter removal.  48ml of water was drained from the balloon. A 20FR foley cath was removed from the bladder, no complications were noted. Patient tolerated well.  Performed by: Carman Ching, PA-C   Additional notes: Counseled patient on normal postoperative findings including dysuria, gross hematuria, and urinary urgency/leakage. Counseled patient to begin Kegel exercises 3x10 sets daily to increase urinary control and wear absorbent products as needed for security. Written and verbal resources provided today. Surgical pathology benign; results shared with patient.   Follow up: Return in about 6 weeks (around 12/16/2022) for Postop f/u with Dr. Apolinar Junes.

## 2022-11-04 NOTE — Patient Instructions (Signed)

## 2022-11-05 ENCOUNTER — Encounter: Payer: Self-pay | Admitting: Urology

## 2022-11-09 ENCOUNTER — Encounter: Payer: Self-pay | Admitting: Gastroenterology

## 2022-11-10 ENCOUNTER — Encounter: Payer: Self-pay | Admitting: Gastroenterology

## 2022-11-11 DIAGNOSIS — M5416 Radiculopathy, lumbar region: Secondary | ICD-10-CM | POA: Diagnosis not present

## 2022-11-12 MED ORDER — PANTOPRAZOLE SODIUM 40 MG PO TBEC: 40.0000 mg | DELAYED_RELEASE_TABLET | Freq: Every day | ORAL | 1 refills | Status: AC

## 2022-11-12 NOTE — Addendum Note (Signed)
Addended by: Roena Malady on: 11/12/2022 10:23 AM   Modules accepted: Orders

## 2022-11-13 DIAGNOSIS — L570 Actinic keratosis: Secondary | ICD-10-CM | POA: Diagnosis not present

## 2022-11-13 DIAGNOSIS — B001 Herpesviral vesicular dermatitis: Secondary | ICD-10-CM | POA: Diagnosis not present

## 2022-11-13 DIAGNOSIS — D2271 Melanocytic nevi of right lower limb, including hip: Secondary | ICD-10-CM | POA: Diagnosis not present

## 2022-11-13 DIAGNOSIS — D225 Melanocytic nevi of trunk: Secondary | ICD-10-CM | POA: Diagnosis not present

## 2022-11-13 DIAGNOSIS — D2272 Melanocytic nevi of left lower limb, including hip: Secondary | ICD-10-CM | POA: Diagnosis not present

## 2022-11-13 DIAGNOSIS — D2261 Melanocytic nevi of right upper limb, including shoulder: Secondary | ICD-10-CM | POA: Diagnosis not present

## 2022-11-13 DIAGNOSIS — D2262 Melanocytic nevi of left upper limb, including shoulder: Secondary | ICD-10-CM | POA: Diagnosis not present

## 2022-11-13 DIAGNOSIS — X32XXXA Exposure to sunlight, initial encounter: Secondary | ICD-10-CM | POA: Diagnosis not present

## 2022-11-13 DIAGNOSIS — Z85828 Personal history of other malignant neoplasm of skin: Secondary | ICD-10-CM | POA: Diagnosis not present

## 2022-11-16 DIAGNOSIS — F332 Major depressive disorder, recurrent severe without psychotic features: Secondary | ICD-10-CM | POA: Diagnosis not present

## 2022-11-25 DIAGNOSIS — G4733 Obstructive sleep apnea (adult) (pediatric): Secondary | ICD-10-CM | POA: Diagnosis not present

## 2022-11-25 DIAGNOSIS — E538 Deficiency of other specified B group vitamins: Secondary | ICD-10-CM | POA: Diagnosis not present

## 2022-11-25 DIAGNOSIS — R519 Headache, unspecified: Secondary | ICD-10-CM | POA: Diagnosis not present

## 2022-11-27 DIAGNOSIS — M5416 Radiculopathy, lumbar region: Secondary | ICD-10-CM | POA: Diagnosis not present

## 2022-12-02 DIAGNOSIS — M9905 Segmental and somatic dysfunction of pelvic region: Secondary | ICD-10-CM | POA: Diagnosis not present

## 2022-12-02 DIAGNOSIS — M9901 Segmental and somatic dysfunction of cervical region: Secondary | ICD-10-CM | POA: Diagnosis not present

## 2022-12-02 DIAGNOSIS — M9903 Segmental and somatic dysfunction of lumbar region: Secondary | ICD-10-CM | POA: Diagnosis not present

## 2022-12-02 DIAGNOSIS — F332 Major depressive disorder, recurrent severe without psychotic features: Secondary | ICD-10-CM | POA: Diagnosis not present

## 2022-12-02 DIAGNOSIS — M9904 Segmental and somatic dysfunction of sacral region: Secondary | ICD-10-CM | POA: Diagnosis not present

## 2022-12-14 ENCOUNTER — Other Ambulatory Visit (INDEPENDENT_AMBULATORY_CARE_PROVIDER_SITE_OTHER): Payer: Self-pay | Admitting: Vascular Surgery

## 2022-12-14 DIAGNOSIS — I7143 Infrarenal abdominal aortic aneurysm, without rupture: Secondary | ICD-10-CM

## 2022-12-14 DIAGNOSIS — I724 Aneurysm of artery of lower extremity: Secondary | ICD-10-CM

## 2022-12-15 ENCOUNTER — Ambulatory Visit (INDEPENDENT_AMBULATORY_CARE_PROVIDER_SITE_OTHER): Payer: Medicare HMO

## 2022-12-15 ENCOUNTER — Encounter: Payer: Self-pay | Admitting: Urology

## 2022-12-15 ENCOUNTER — Ambulatory Visit: Payer: Medicare HMO | Admitting: Urology

## 2022-12-15 ENCOUNTER — Encounter (INDEPENDENT_AMBULATORY_CARE_PROVIDER_SITE_OTHER): Payer: Self-pay | Admitting: Vascular Surgery

## 2022-12-15 ENCOUNTER — Ambulatory Visit (INDEPENDENT_AMBULATORY_CARE_PROVIDER_SITE_OTHER): Payer: Medicare HMO | Admitting: Vascular Surgery

## 2022-12-15 VITALS — BP 130/83 | HR 62 | Ht 72.0 in | Wt 201.2 lb

## 2022-12-15 VITALS — BP 124/71 | HR 63 | Resp 16 | Wt 201.4 lb

## 2022-12-15 DIAGNOSIS — I7143 Infrarenal abdominal aortic aneurysm, without rupture: Secondary | ICD-10-CM

## 2022-12-15 DIAGNOSIS — N138 Other obstructive and reflux uropathy: Secondary | ICD-10-CM | POA: Diagnosis not present

## 2022-12-15 DIAGNOSIS — I10 Essential (primary) hypertension: Secondary | ICD-10-CM | POA: Diagnosis not present

## 2022-12-15 DIAGNOSIS — I724 Aneurysm of artery of lower extremity: Secondary | ICD-10-CM | POA: Diagnosis not present

## 2022-12-15 DIAGNOSIS — N5201 Erectile dysfunction due to arterial insufficiency: Secondary | ICD-10-CM

## 2022-12-15 DIAGNOSIS — N401 Enlarged prostate with lower urinary tract symptoms: Secondary | ICD-10-CM | POA: Diagnosis not present

## 2022-12-15 DIAGNOSIS — E782 Mixed hyperlipidemia: Secondary | ICD-10-CM

## 2022-12-15 LAB — BLADDER SCAN AMB NON-IMAGING: PVR: 0 WU

## 2022-12-15 MED ORDER — TADALAFIL 20 MG PO TABS
20.0000 mg | ORAL_TABLET | Freq: Every day | ORAL | 11 refills | Status: DC | PRN
Start: 2022-12-15 — End: 2023-04-21

## 2022-12-15 NOTE — Progress Notes (Signed)
MRN : [id number]  Mitchell Powell is a 72 y.o. ([birth year]) male who presents with chief complaint of  Chief Complaint  Patient presents with   Follow-up    46yr ultrasound follow up  .  History of Present Illness: Patient returns today in follow up of his abdominal aortic aneurysm and popliteal artery aneurysms.  He is over a decade status post endovascular repair of his abdominal aortic aneurysm.  He has no current aneurysm related symptoms. Specifically, the patient denies new back or abdominal pain, or signs of peripheral embolization.  Duplex today shows a patent stent graft without endoleak.  The maximal diameter of the aortic sac is approximately 3.67 cm. Right popliteal artery measures 1.22 centimeters in maximal diameter and the left popliteal artery measures 1.14 cm in maximal diameter.  Current Outpatient Medications  Medication Sig Dispense Refill   clonazePAM (KLONOPIN) 0.5 MG tablet Take 1 tablet (0.5 mg total) by mouth at bedtime. 30 tablet 0   famotidine (PEPCID) 20 MG tablet Take 1 tablet (20 mg total) by mouth 2 (two) times daily. 60 tablet 0   finasteride (PROSCAR) 5 MG tablet TAKE 1 TABLET EVERY DAY 90 tablet 3   FLUoxetine (PROZAC) 20 MG capsule Take 1 capsule (20 mg total) by mouth daily. 30 capsule 0   fluticasone (FLONASE) 50 MCG/ACT nasal spray Place 1 spray into both nostrils as needed.     hydrOXYzine (ATARAX) 25 MG tablet Take 1 tablet (25 mg total) by mouth 3 (three) times daily as needed for anxiety. 30 tablet 0   levothyroxine (SYNTHROID) 50 MCG tablet Take 1 tablet (50 mcg total) by mouth daily at 6 (six) AM. 30 tablet 0   loratadine (CLARITIN) 10 MG tablet Take 1 tablet (10 mg total) by mouth daily. 30 tablet 0   losartan (COZAAR) 50 MG tablet Take 1 tablet (50 mg total) by mouth daily. 30 tablet 0   mirtazapine (REMERON) 15 MG tablet Take 7.5-15 mg by mouth at bedtime as needed.     montelukast (SINGULAIR) 10 MG tablet Take 10 mg by mouth at bedtime.      Multiple Vitamin (MULTIVITAMIN WITH MINERALS) TABS tablet Take 1 tablet by mouth daily. 30 tablet 0   Omega-3 Fatty Acids (FISH OIL) 1000 MG CPDR Take 1 capsule by mouth daily.     pantoprazole (PROTONIX) 40 MG tablet Take 1 tablet (40 mg total) by mouth daily. 90 tablet 1   pravastatin (PRAVACHOL) 40 MG tablet Take 1 tablet (40 mg total) by mouth daily at 6 PM. 30 tablet 0   white petrolatum (VASELINE) OINT Apply 1 Application topically as needed (dry nares). 30 g 0   tadalafil (CIALIS) 20 MG tablet Take 1 tablet (20 mg total) by mouth daily as needed. 30 tablet 11   No current facility-administered medications for this visit.    Past Medical History:  Diagnosis Date   Anxiety    Depression    GERD (gastroesophageal reflux disease)    Hyperlipidemia    Hypertension    Hypothyroidism    Sleep apnea     Past Surgical History:  Procedure Laterality Date   ABDOMINAL AORTIC ANEURYSM REPAIR  12/02/2011   COLONOSCOPY WITH PROPOFOL N/A 05/10/2018   Procedure: COLONOSCOPY WITH PROPOFOL;  Surgeon: Midge Minium, MD;  Location: [facility] END[facility]COPY;  Service: Endoscopy;  Laterality: N/A;   ESOPHAGOGASTRODUODENOSCOPY     HOLEP-LASER ENUCLEATION OF THE PROSTATE WITH MORCELLATION N/A 11/02/2022   Procedure: HOLEP-LASER ENUCLEATION OF THE PROSTATE WITH  MORCELLATION;  Surgeon: Vanna Scotland, MD;  Location: [facility] ORS;  Service: Urology;  Laterality: N/A;   JOINT REPLACEMENT Left    hip   TONSILLECTOMY AND ADENOIDECTOMY       Social History   Tobacco Use   Smoking status: Former    Current packs/day: 0.50    Types: Cigarettes    Passive exposure: Never   Smokeless tobacco: Never  Vaping Use   Vaping status: Never Used  Substance Use Topics   Alcohol use: Yes    Comment: occ   Drug use: No      Family History  Problem Relation Age of Onset   Varicose Veins Father    Heart attack Brother    Diabetes Brother      Allergies  Allergen Reactions   Prednisone     Other reaction(s):  Other (See Comments) irritiability    REVIEW OF SYSTEMS (Negative unless checked)   Constitutional: [] Weight loss  [] Fever  [] Chills Cardiac: [] Chest pain   [] Chest pressure   [] Palpitations   [] Shortness of breath when laying flat   [] Shortness of breath at rest   [] Shortness of breath with exertion. Vascular:  [x] Pain in legs with walking   [x] Pain in legs at rest   [] Pain in legs when laying flat   [] Claudication   [] Pain in feet when walking  [] Pain in feet at rest  [] Pain in feet when laying flat   [] History of DVT   [] Phlebitis   [] Swelling in legs   [] Varicose veins   [] Non-healing ulcers Pulmonary:   [] Uses home oxygen   [] Productive cough   [] Hemoptysis   [] Wheeze  [] COPD   [] Asthma Neurologic:  [] Dizziness  [] Blackouts   [] Seizures   [] History of stroke   [] History of TIA  [] Aphasia   [] Temporary blindness   [] Dysphagia   [] Weakness or numbness in arms   [] Weakness or numbness in legs Musculoskeletal:  [x] Arthritis   [] Joint swelling   [] Joint pain   [] Low back pain Hematologic:  [] Easy bruising  [] Easy bleeding   [] Hypercoagulable state   [] Anemic   Gastrointestinal:  [] Blood in stool   [] Vomiting blood  [x] Gastroesophageal reflux/heartburn   [] Abdominal pain Genitourinary:  [] Chronic kidney disease   [] Difficult urination  [] Frequent urination  [] Burning with urination   [] Hematuria Skin:  [] Rashes   [] Ulcers   [] Wounds Psychological:  [x] History of anxiety   [x]  History of major depression.  Physical Examination  BP 124/71 (BP Location: Left Arm)   Pulse 63   Resp 16   Wt 201 lb 6.4 oz (91.4 kg)   BMI 27.31 kg/m  Gen:  WD/WN, NAD Head: [place]/AT, No temporalis wasting. Ear/Nose/Throat: Hearing grossly intact, nares w/o erythema or drainage Eyes: Conjunctiva clear. Sclera non-icteric Neck: Supple.  Trachea midline Pulmonary:  Good air movement, no use of accessory muscles.  Cardiac: RRR, no JVD Vascular:  Vessel Right Left  Radial Palpable Palpable                       Popliteal  Strongly palpable Strongly palpable  PT Palpable Palpable  DP Palpable Palpable   Gastrointestinal: soft, non-tender/non-distended. No guarding/reflex.  Musculoskeletal: M/S 5/5 throughout.  No deformity or atrophy. Trace LE edema. Neurologic: Sensation grossly intact in extremities.  Symmetrical.  Speech is fluent.  Psychiatric: Judgment intact, Mood & affect appropriate for pt's clinical situation. Dermatologic: No rashes or ulcers noted.  No cellulitis or open wounds.      Labs Recent Results (  from the past 2160 hour(s))  Bladder Scan (Post Void Residual) in office     Status: None   Collection Time: 09/30/22 10:54 AM  Result Value Ref Range   Scan Result 390   Urinalysis, Complete     Status: Abnormal   Collection Time: 09/30/22 11:26 AM  Result Value Ref Range   Specific Gravity, UA <1.005 (L) 1.005 - 1.030   pH, UA 5.0 5.0 - 7.5   Color, UA Yellow Yellow   Appearance Ur Clear Clear   Leukocytes,UA Negative Negative   Protein,UA Negative Negative/Trace   Glucose, UA Negative Negative   Ketones, UA Negative Negative   RBC, UA Negative Negative   Bilirubin, UA Negative Negative   Urobilinogen, Ur 0.2 0.2 - 1.0 mg/dL   Nitrite, UA Negative Negative   Microscopic Examination See below:   CULTURE, URINE COMPREHENSIVE     Status: Abnormal   Collection Time: 09/30/22 11:26 AM   Specimen: Urine   UR  Result Value Ref Range   Urine Culture, Comprehensive Final report (A)    Organism ID, Bacteria Comment (A)     Comment: Staphylococcus epidermidis Based on resistance to oxacillin this isolate would be resistant to all currently available beta-lactam antimicrobial agents, with the exception of the newer cephalosporins with anti-MRSA activity, such as Ceftaroline 1,000 Colonies/mL    ANTIMICROBIAL SUSCEPTIBILITY Comment     Comment:       ** S = Susceptible; I = Intermediate; R = Resistant **                    P = Positive; N = Negative             MICS are  expressed in micrograms per mL    Antibiotic                 RSLT#1    RSLT#2    RSLT#3    RSLT#4 Ciprofloxacin                  S Gentamicin                     S Levofloxacin                   S Linezolid                      S Moxifloxacin                   S Nitrofurantoin                 S Oxacillin                      R Penicillin                     R Quinupristin/Dalfopristin      S Rifampin                       S Tetracycline                   R Trimethoprim/Sulfa             R Vancomycin                     S   Microscopic Examination     Status: None  Collection Time: 09/30/22 11:26 AM   Urine  Result Value Ref Range   WBC, UA 0-5 0 - 5 /hpf   RBC, Urine 0-2 0 - 2 /hpf   Epithelial Cells (non renal) None seen 0 - 10 /hpf   Bacteria, UA None seen None seen/Few  Bladder Scan (Post Void Residual) in office     Status: None   Collection Time: 12/15/22 10:48 AM  Result Value Ref Range   PVR 0.0 WU    Radiology No results found.  Assessment/Plan Hypertension blood pressure control important in reducing the progression of atherosclerotic disease and in his case particularly aneurysmal growth. On appropriate oral medications.     Hyperlipidemia lipid control important in reducing the progression of atherosclerotic disease. Continue statin therapy  AAA (abdominal aortic aneurysm) without rupture (HCC) Duplex today shows a patent stent graft without endoleak.  The maximal diameter of the aortic sac is approximately 3.67 cm.  Doing well over a decade status post repair.  Continue annual surveillance with duplex.  Popliteal artery aneurysm (HCC) Right popliteal artery measures 1.22 centimeters in maximal diameter and the left popliteal artery measures 1.14 cm in maximal diameter.  No major changes in that that size no repair is necessary.  Continue to follow annually with duplex.    Festus Barren, MD  12/15/2022 4:09 PM    This note was created with Dragon  medical transcription system.  Any errors from dictation are purely unintentional

## 2022-12-15 NOTE — Progress Notes (Addendum)
12/15/2022 12:31 PM   Mitchell Powell 1950-03-03 [id number]  Referring provider: Marguarite Arbour, MD [address] [facility] [place],  [place] [address]  Chief Complaint  Patient presents with   Benign Prostatic Hypertrophy    HPI: 72 year-old male with a personal history of BPH with poorly controlled urinary symptoms presents today for follow-up.  He is status post HoLep on 11/02/2022. His pre-op IPSS was 23/4 with a PVR of 390. The procedure was uncomplicated and he returned two days later for his Foley catheter removal. Surgical pathology showed evidence of BPH 32 grams of tissue.  His prostate volume back in 2022 was 91cc's.   He is currently on Finasteride and Cialis daily.   He is doing well overall. He has been doing Kegel exercises. He reports that sometimes he has stress incontinence but his stream is strong again and he is able to empty fully now.  Leakage is fairly rare leak when he twists in a awkward position.  Results for orders placed or performed in visit on 12/15/22  Bladder Scan (Post Void Residual) in office  Result Value Ref Range   PVR 0.0 WU    IPSS     Row Name 12/15/22 1050         International Prostate Symptom Score   How often have you had the sensation of not emptying your bladder? Less than half the time     How often have you had to urinate less than every two hours? Less than 1 in 5 times     How often have you found you stopped and started again several times when you urinated? Not at All     How often have you found it difficult to postpone urination? Less than 1 in 5 times     How often have you had a weak urinary stream? Not at All     How often have you had to strain to start urination? Less than 1 in 5 times     How many times did you typically get up at night to urinate? 2 Times     Total IPSS Score 7       Quality of Life due to urinary symptoms   If you were to spend the rest of your life with your urinary  condition just the way it is now how would you feel about that? Pleased            Score:  1-7 Mild 8-19 Moderate 20-35 Severe   PMH: Past Medical History:  Diagnosis Date   Anxiety    Depression    GERD (gastroesophageal reflux disease)    Hyperlipidemia    Hypertension    Hypothyroidism    Sleep apnea     Surgical History: Past Surgical History:  Procedure Laterality Date   ABDOMINAL AORTIC ANEURYSM REPAIR  12/02/2011   COLONOSCOPY WITH PROPOFOL N/A 05/10/2018   Procedure: COLONOSCOPY WITH PROPOFOL;  Surgeon: Midge Minium, MD;  Location: [facility] ENDOSCOPY;  Service: Endoscopy;  Laterality: N/A;   ESOPHAGOGASTRODUODENOSCOPY     HOLEP-LASER ENUCLEATION OF THE PROSTATE WITH MORCELLATION N/A 11/02/2022   Procedure: HOLEP-LASER ENUCLEATION OF THE PROSTATE WITH MORCELLATION;  Surgeon: Vanna Scotland, MD;  Location: [facility] ORS;  Service: Urology;  Laterality: N/A;   JOINT REPLACEMENT Left    hip   TONSILLECTOMY AND ADENOIDECTOMY      Home Medications:  Allergies as of 12/15/2022       Reactions   Prednisone  Other reaction(s): Other (See Comments) irritiability        Medication List        Accurate as of December 15, 2022 12:31 PM. If you have any questions, ask your nurse or doctor.          STOP taking these medications    acidophilus Caps capsule Stopped by: Vanna Scotland   oxybutynin 5 MG tablet Commonly known as: DITROPAN Stopped by: Vanna Scotland   oxyCODONE-acetaminophen 5-325 MG tablet Commonly known as: Percocet Stopped by: Vanna Scotland       TAKE these medications    clonazePAM 0.5 MG tablet Commonly known as: KLONOPIN Take 1 tablet (0.5 mg total) by mouth at bedtime.   famotidine 20 MG tablet Commonly known as: PEPCID Take 1 tablet (20 mg total) by mouth 2 (two) times daily.   finasteride 5 MG tablet Commonly known as: PROSCAR TAKE 1 TABLET EVERY DAY   Fish Oil 1000 MG Cpdr Take 1 capsule by mouth daily.   FLUoxetine 20  MG capsule Commonly known as: PROZAC Take 1 capsule (20 mg total) by mouth daily.   fluticasone 50 MCG/ACT nasal spray Commonly known as: FLONASE Place 1 spray into both nostrils as needed.   hydrOXYzine 25 MG tablet Commonly known as: ATARAX Take 1 tablet (25 mg total) by mouth 3 (three) times daily as needed for anxiety.   levothyroxine 50 MCG tablet Commonly known as: SYNTHROID Take 1 tablet (50 mcg total) by mouth daily at 6 (six) AM.   loratadine 10 MG tablet Commonly known as: CLARITIN Take 1 tablet (10 mg total) by mouth daily.   losartan 50 MG tablet Commonly known as: COZAAR Take 1 tablet (50 mg total) by mouth daily.   mirtazapine 15 MG tablet Commonly known as: REMERON Take 7.5-15 mg by mouth at bedtime as needed.   montelukast 10 MG tablet Commonly known as: SINGULAIR Take 10 mg by mouth at bedtime.   multivitamin with minerals Tabs tablet Take 1 tablet by mouth daily.   pantoprazole 40 MG tablet Commonly known as: PROTONIX Take 1 tablet (40 mg total) by mouth daily.   pravastatin 40 MG tablet Commonly known as: PRAVACHOL Take 1 tablet (40 mg total) by mouth daily at 6 PM.   tadalafil 20 MG tablet Commonly known as: CIALIS Take 1 tablet (20 mg total) by mouth daily as needed. What changed:  medication strength how much to take reasons to take this Changed by: Vanna Scotland   white petrolatum Oint Commonly known as: VASELINE Apply 1 Application topically as needed (dry nares).        Allergies:  Allergies  Allergen Reactions   Prednisone     Other reaction(s): Other (See Comments) irritiability    Family History: Family History  Problem Relation Age of Onset   Varicose Veins Father    Heart attack Brother    Diabetes Brother     Social History:  reports that he has quit smoking. His smoking use included cigarettes. He has never been exposed to tobacco smoke. He has never used smokeless tobacco. He reports current alcohol use. He  reports that he does not use drugs.   Physical Exam: BP 130/83   Pulse 62   Ht 6' (1.829 m)   Wt 201 lb 3.2 oz (91.3 kg)   BMI 27.29 kg/m   Constitutional:  Alert and oriented, No acute distress. HEENT: [place] AT, moist mucus membranes.  Trachea midline, no masses. Neurologic: Grossly intact, no focal deficits,  moving all 4 extremities. Psychiatric: Normal mood and affect.   Assessment & Plan:    1. BPH with outlet obstruction  - S/p HoLep.  - Urinary symptoms are improved and PVR is 0 today. Continue taking Finasteride.  - Discontinue daily Cialis.   2. Erectile dysfunction  - Given dramatic improvement in urinary symptoms, no real need for daily Cialis in terms of urinary control.  He does have some baseline erectile dysfunction and wonders about options -Discussed daily 20 mg on demand dosing for erectile dysfunction, at least 2 hours prior to intercourse ideally on empty stomach.  He is agreeable this plan.   Return in about 1 year (around 12/15/2023) for IPSS, PVR, PSA.  I have reviewed the above documentation for accuracy and completeness, and I agree with the above.   Vanna Scotland, MD    [facility] [address], [address] [place], [place] [address] [phone]

## 2022-12-15 NOTE — Assessment & Plan Note (Signed)
Right popliteal artery measures 1.22 centimeters in maximal diameter and the left popliteal artery measures 1.14 cm in maximal diameter.  No major changes in that that size no repair is necessary.  Continue to follow annually with duplex.

## 2022-12-15 NOTE — Assessment & Plan Note (Signed)
Duplex today shows a patent stent graft without endoleak.  The maximal diameter of the aortic sac is approximately 3.67 cm.  Doing well over a decade status post repair.  Continue annual surveillance with duplex.

## 2022-12-16 DIAGNOSIS — M9905 Segmental and somatic dysfunction of pelvic region: Secondary | ICD-10-CM | POA: Diagnosis not present

## 2022-12-16 DIAGNOSIS — M9904 Segmental and somatic dysfunction of sacral region: Secondary | ICD-10-CM | POA: Diagnosis not present

## 2022-12-16 DIAGNOSIS — M9903 Segmental and somatic dysfunction of lumbar region: Secondary | ICD-10-CM | POA: Diagnosis not present

## 2022-12-16 DIAGNOSIS — M9901 Segmental and somatic dysfunction of cervical region: Secondary | ICD-10-CM | POA: Diagnosis not present

## 2022-12-22 DIAGNOSIS — F332 Major depressive disorder, recurrent severe without psychotic features: Secondary | ICD-10-CM | POA: Diagnosis not present

## 2022-12-30 DIAGNOSIS — M9905 Segmental and somatic dysfunction of pelvic region: Secondary | ICD-10-CM | POA: Diagnosis not present

## 2022-12-30 DIAGNOSIS — M9904 Segmental and somatic dysfunction of sacral region: Secondary | ICD-10-CM | POA: Diagnosis not present

## 2022-12-30 DIAGNOSIS — M9903 Segmental and somatic dysfunction of lumbar region: Secondary | ICD-10-CM | POA: Diagnosis not present

## 2022-12-30 DIAGNOSIS — M9901 Segmental and somatic dysfunction of cervical region: Secondary | ICD-10-CM | POA: Diagnosis not present

## 2023-01-14 DIAGNOSIS — F332 Major depressive disorder, recurrent severe without psychotic features: Secondary | ICD-10-CM | POA: Diagnosis not present

## 2023-01-19 DIAGNOSIS — E782 Mixed hyperlipidemia: Secondary | ICD-10-CM | POA: Diagnosis not present

## 2023-01-19 DIAGNOSIS — Z79899 Other long term (current) drug therapy: Secondary | ICD-10-CM | POA: Diagnosis not present

## 2023-01-19 DIAGNOSIS — E039 Hypothyroidism, unspecified: Secondary | ICD-10-CM | POA: Diagnosis not present

## 2023-01-28 DIAGNOSIS — E039 Hypothyroidism, unspecified: Secondary | ICD-10-CM | POA: Diagnosis not present

## 2023-01-28 DIAGNOSIS — E7800 Pure hypercholesterolemia, unspecified: Secondary | ICD-10-CM | POA: Diagnosis not present

## 2023-01-28 DIAGNOSIS — I10 Essential (primary) hypertension: Secondary | ICD-10-CM | POA: Diagnosis not present

## 2023-01-28 DIAGNOSIS — F411 Generalized anxiety disorder: Secondary | ICD-10-CM | POA: Diagnosis not present

## 2023-02-03 DIAGNOSIS — M9905 Segmental and somatic dysfunction of pelvic region: Secondary | ICD-10-CM | POA: Diagnosis not present

## 2023-02-03 DIAGNOSIS — M9904 Segmental and somatic dysfunction of sacral region: Secondary | ICD-10-CM | POA: Diagnosis not present

## 2023-02-03 DIAGNOSIS — M9903 Segmental and somatic dysfunction of lumbar region: Secondary | ICD-10-CM | POA: Diagnosis not present

## 2023-02-03 DIAGNOSIS — M9901 Segmental and somatic dysfunction of cervical region: Secondary | ICD-10-CM | POA: Diagnosis not present

## 2023-02-04 ENCOUNTER — Telehealth: Payer: Self-pay

## 2023-02-04 DIAGNOSIS — L820 Inflamed seborrheic keratosis: Secondary | ICD-10-CM | POA: Diagnosis not present

## 2023-02-04 DIAGNOSIS — L570 Actinic keratosis: Secondary | ICD-10-CM | POA: Diagnosis not present

## 2023-02-04 DIAGNOSIS — L309 Dermatitis, unspecified: Secondary | ICD-10-CM | POA: Diagnosis not present

## 2023-02-04 DIAGNOSIS — L538 Other specified erythematous conditions: Secondary | ICD-10-CM | POA: Diagnosis not present

## 2023-02-04 DIAGNOSIS — Z08 Encounter for follow-up examination after completed treatment for malignant neoplasm: Secondary | ICD-10-CM | POA: Diagnosis not present

## 2023-02-04 DIAGNOSIS — Z85828 Personal history of other malignant neoplasm of skin: Secondary | ICD-10-CM | POA: Diagnosis not present

## 2023-02-04 NOTE — Telephone Encounter (Signed)
Patient has requested to schedule colonoscopy w/EGD.  Colonoscopy is not due until March 2025.  He stated that he experiences upset stomach after eating.  Pepcid is not helping.  Please advise if you would like for me to schedule him an office visit to discuss further or schedule colonoscopy with EGD.  Last office visit 06/09/22.  Thanks,  [place], [place]

## 2023-02-08 ENCOUNTER — Other Ambulatory Visit: Payer: Self-pay

## 2023-02-08 ENCOUNTER — Telehealth: Payer: Self-pay

## 2023-02-08 DIAGNOSIS — I499 Cardiac arrhythmia, unspecified: Secondary | ICD-10-CM | POA: Diagnosis not present

## 2023-02-08 DIAGNOSIS — Z9889 Other specified postprocedural states: Secondary | ICD-10-CM | POA: Diagnosis not present

## 2023-02-08 DIAGNOSIS — K219 Gastro-esophageal reflux disease without esophagitis: Secondary | ICD-10-CM

## 2023-02-08 DIAGNOSIS — R001 Bradycardia, unspecified: Secondary | ICD-10-CM | POA: Diagnosis not present

## 2023-02-08 DIAGNOSIS — E7800 Pure hypercholesterolemia, unspecified: Secondary | ICD-10-CM | POA: Diagnosis not present

## 2023-02-08 DIAGNOSIS — Z860100 Personal history of colon polyps, unspecified: Secondary | ICD-10-CM

## 2023-02-08 DIAGNOSIS — G4733 Obstructive sleep apnea (adult) (pediatric): Secondary | ICD-10-CM | POA: Diagnosis not present

## 2023-02-08 DIAGNOSIS — I10 Essential (primary) hypertension: Secondary | ICD-10-CM | POA: Diagnosis not present

## 2023-02-08 DIAGNOSIS — J431 Panlobular emphysema: Secondary | ICD-10-CM | POA: Diagnosis not present

## 2023-02-08 MED ORDER — NA SULFATE-K SULFATE-MG SULF 17.5-3.13-1.6 GM/177ML PO SOLN
1.0000 | Freq: Once | ORAL | 0 refills | Status: AC
Start: 2023-02-08 — End: 2023-02-08

## 2023-02-08 NOTE — Telephone Encounter (Signed)
Gastroenterology Pre-Procedure Review  Request Date: 03/11/23 Requesting Physician: Dr. Servando Snare  PATIENT REVIEW QUESTIONS: The patient responded to the following health history questions as indicated:    1. Are you having any GI issues? yes (stomach hurts after eating meals despite taking pantoprazole and famotodine history of GERD) 2. Do you have a personal history of Polyps? yes (history of colon polyps last colonoscopy performed by Dr. Servando Snare 05/10/2018 recommended repeat in 3 years ) 3. Do you have a family history of Colon Cancer or Polyps? no 4. Diabetes Mellitus? no 5. Joint replacements in the past 12 months?no 6. Major health problems in the past 3 months?no 7. Any artificial heart valves, MVP, or defibrillator?no    MEDICATIONS & ALLERGIES:    Patient reports the following regarding taking any anticoagulation/antiplatelet therapy:   Plavix, Coumadin, Eliquis, Xarelto, Lovenox, Pradaxa, Brilinta, or Effient? no Aspirin? no Cardiac history? Yes clearance sent to Domenica Reamer at [facility]  Patient confirms/reports the following medications:  Current Outpatient Medications  Medication Sig Dispense Refill   clonazePAM (KLONOPIN) 0.5 MG tablet Take 1 tablet (0.5 mg total) by mouth at bedtime. 30 tablet 0   famotidine (PEPCID) 20 MG tablet Take 1 tablet (20 mg total) by mouth 2 (two) times daily. 60 tablet 0   finasteride (PROSCAR) 5 MG tablet TAKE 1 TABLET EVERY DAY 90 tablet 3   FLUoxetine (PROZAC) 20 MG capsule Take 1 capsule (20 mg total) by mouth daily. 30 capsule 0   fluticasone (FLONASE) 50 MCG/ACT nasal spray Place 1 spray into both nostrils as needed.     hydrOXYzine (ATARAX) 25 MG tablet Take 1 tablet (25 mg total) by mouth 3 (three) times daily as needed for anxiety. 30 tablet 0   levothyroxine (SYNTHROID) 50 MCG tablet Take 1 tablet (50 mcg total) by mouth daily at 6 (six) AM. 30 tablet 0   loratadine (CLARITIN) 10 MG tablet Take 1 tablet (10 mg total) by mouth daily. 30  tablet 0   losartan (COZAAR) 50 MG tablet Take 1 tablet (50 mg total) by mouth daily. 30 tablet 0   mirtazapine (REMERON) 15 MG tablet Take 7.5-15 mg by mouth at bedtime as needed.     montelukast (SINGULAIR) 10 MG tablet Take 10 mg by mouth at bedtime.     Multiple Vitamin (MULTIVITAMIN WITH MINERALS) TABS tablet Take 1 tablet by mouth daily. 30 tablet 0   Omega-3 Fatty Acids (FISH OIL) 1000 MG CPDR Take 1 capsule by mouth daily.     pantoprazole (PROTONIX) 40 MG tablet Take 1 tablet (40 mg total) by mouth daily. 90 tablet 1   pravastatin (PRAVACHOL) 40 MG tablet Take 1 tablet (40 mg total) by mouth daily at 6 PM. 30 tablet 0   tadalafil (CIALIS) 20 MG tablet Take 1 tablet (20 mg total) by mouth daily as needed. 30 tablet 11   white petrolatum (VASELINE) OINT Apply 1 Application topically as needed (dry nares). 30 g 0   No current facility-administered medications for this visit.    Patient confirms/reports the following allergies:  Allergies  Allergen Reactions   Prednisone     Other reaction(s): Other (See Comments) irritiability    No orders of the defined types were placed in this encounter.   AUTHORIZATION INFORMATION Primary Insurance: 1D#: Group #:  Secondary Insurance: 1D#: Group #:  SCHEDULE INFORMATION: Date: 03/11/23 Time: Location: [facility]

## 2023-02-10 ENCOUNTER — Other Ambulatory Visit: Payer: Self-pay | Admitting: Medical Genetics

## 2023-02-12 ENCOUNTER — Other Ambulatory Visit
Admission: RE | Admit: 2023-02-12 | Discharge: 2023-02-12 | Disposition: A | Discharge status: Home / Self Care | Payer: Self-pay | Source: Ambulatory Visit | Attending: Medical Genetics | Admitting: Medical Genetics

## 2023-02-16 NOTE — Telephone Encounter (Signed)
Cardiac clearance received from Leanora Ivanoff [facility] Cardiology Dept on 02/11/24.  "Patient is cleared to have procedure"  Marcelino Duster, CMA

## 2023-02-26 LAB — GENECONNECT MOLECULAR SCREEN: Genetic Analysis Overall Interpretation: NEGATIVE

## 2023-03-04 ENCOUNTER — Encounter: Payer: Self-pay | Admitting: Gastroenterology

## 2023-03-11 ENCOUNTER — Ambulatory Visit: Payer: Medicare Other | Admitting: Certified Registered Nurse Anesthetist

## 2023-03-11 ENCOUNTER — Encounter
Admission: RE | Disposition: A | Discharge status: Home / Self Care | Payer: Self-pay | Source: Home / Self Care | Attending: Gastroenterology

## 2023-03-11 ENCOUNTER — Encounter: Payer: Self-pay | Admitting: Gastroenterology

## 2023-03-11 ENCOUNTER — Other Ambulatory Visit: Payer: Self-pay

## 2023-03-11 ENCOUNTER — Ambulatory Visit
Admission: RE | Admit: 2023-03-11 | Discharge: 2023-03-11 | Disposition: A | Discharge status: Home / Self Care | Payer: Medicare Other | Attending: Gastroenterology | Admitting: Gastroenterology

## 2023-03-11 DIAGNOSIS — D120 Benign neoplasm of cecum: Secondary | ICD-10-CM | POA: Insufficient documentation

## 2023-03-11 DIAGNOSIS — D124 Benign neoplasm of descending colon: Secondary | ICD-10-CM | POA: Diagnosis not present

## 2023-03-11 DIAGNOSIS — K640 First degree hemorrhoids: Secondary | ICD-10-CM | POA: Diagnosis not present

## 2023-03-11 DIAGNOSIS — Z860101 Personal history of adenomatous and serrated colon polyps: Secondary | ICD-10-CM | POA: Diagnosis not present

## 2023-03-11 DIAGNOSIS — I10 Essential (primary) hypertension: Secondary | ICD-10-CM | POA: Diagnosis not present

## 2023-03-11 DIAGNOSIS — G4730 Sleep apnea, unspecified: Secondary | ICD-10-CM | POA: Diagnosis not present

## 2023-03-11 DIAGNOSIS — E039 Hypothyroidism, unspecified: Secondary | ICD-10-CM | POA: Diagnosis not present

## 2023-03-11 DIAGNOSIS — D122 Benign neoplasm of ascending colon: Secondary | ICD-10-CM | POA: Diagnosis not present

## 2023-03-11 DIAGNOSIS — Z860100 Personal history of colon polyps, unspecified: Secondary | ICD-10-CM

## 2023-03-11 DIAGNOSIS — K635 Polyp of colon: Secondary | ICD-10-CM | POA: Diagnosis not present

## 2023-03-11 DIAGNOSIS — R12 Heartburn: Secondary | ICD-10-CM | POA: Diagnosis not present

## 2023-03-11 DIAGNOSIS — K449 Diaphragmatic hernia without obstruction or gangrene: Secondary | ICD-10-CM | POA: Insufficient documentation

## 2023-03-11 DIAGNOSIS — J449 Chronic obstructive pulmonary disease, unspecified: Secondary | ICD-10-CM | POA: Diagnosis not present

## 2023-03-11 DIAGNOSIS — Z87891 Personal history of nicotine dependence: Secondary | ICD-10-CM | POA: Insufficient documentation

## 2023-03-11 DIAGNOSIS — J635 Stannosis: Secondary | ICD-10-CM | POA: Diagnosis not present

## 2023-03-11 DIAGNOSIS — K219 Gastro-esophageal reflux disease without esophagitis: Secondary | ICD-10-CM | POA: Diagnosis not present

## 2023-03-11 DIAGNOSIS — Z1211 Encounter for screening for malignant neoplasm of colon: Secondary | ICD-10-CM | POA: Insufficient documentation

## 2023-03-11 HISTORY — PX: POLYPECTOMY: SHX5525

## 2023-03-11 HISTORY — PX: COLONOSCOPY WITH PROPOFOL: SHX5780

## 2023-03-11 HISTORY — PX: ESOPHAGOGASTRODUODENOSCOPY (EGD) WITH PROPOFOL: SHX5813

## 2023-03-11 SURGERY — COLONOSCOPY WITH PROPOFOL
Anesthesia: General

## 2023-03-11 MED ORDER — PROPOFOL 500 MG/50ML IV EMUL
INTRAVENOUS | Status: DC | PRN
  Administered 2023-03-11: 140 ug/kg/min via INTRAVENOUS

## 2023-03-11 MED ORDER — SODIUM CHLORIDE 0.9 % IV SOLN: INTRAVENOUS | Status: DC

## 2023-03-11 MED ORDER — LIDOCAINE HCL (CARDIAC) PF 100 MG/5ML IV SOSY
PREFILLED_SYRINGE | INTRAVENOUS | Status: DC | PRN
  Administered 2023-03-11: 100 mg via INTRAVENOUS

## 2023-03-11 MED ORDER — PROPOFOL 1000 MG/100ML IV EMUL
INTRAVENOUS | Status: AC
Start: 2023-03-11 — End: ?
  Filled 2023-03-11: qty 100

## 2023-03-11 MED ORDER — PROPOFOL 10 MG/ML IV BOLUS
INTRAVENOUS | Status: DC | PRN
  Administered 2023-03-11: 50 mg via INTRAVENOUS

## 2023-03-11 NOTE — Op Note (Signed)
[facility] Gastroenterology Patient Name: Mitchell Powell Procedure Date: 03/11/2023 10:02 AM MRN: [id number] Account #: [id number] Date of Birth: [birth year] Admit Type: Outpatient Age: 73 Room: [facility] ENDO ROOM 4 Gender: Male Note Status: Finalized Instrument Name: Upper Endoscope [id number] Procedure:             Upper GI endoscopy Indications:           Heartburn Providers:             Midge Minium MD, MD Referring MD:          Duane Lope. Judithann Sheen, MD (Referring MD) Medicines:             Propofol per Anesthesia Complications:         No immediate complications. Procedure:             Pre-Anesthesia Assessment:                        - Prior to the procedure, a History and Physical was                         performed, and patient medications and allergies were                         reviewed. The patient's tolerance of previous                         anesthesia was also reviewed. The risks and benefits                         of the procedure and the sedation options and risks                         were discussed with the patient. All questions were                         answered, and informed consent was obtained. Prior                         Anticoagulants: The patient has taken no anticoagulant                         or antiplatelet agents. ASA Grade Assessment: II - A                         patient with mild systemic disease. After reviewing                         the risks and benefits, the patient was deemed in                         satisfactory condition to undergo the procedure.                        After obtaining informed consent, the endoscope was                         passed under direct vision. Throughout the procedure,  the patient's blood pressure, pulse, and oxygen                         saturations were monitored continuously. The Endoscope                         was introduced through the mouth, and advanced to  the                         second part of duodenum. The upper GI endoscopy was                         accomplished without difficulty. The patient tolerated                         the procedure well. Findings:      A medium-sized hiatal hernia was present.      The stomach was normal.      The examined duodenum was normal. Impression:            - Medium-sized hiatal hernia.                        - Normal stomach.                        - Normal examined duodenum.                        - No specimens collected. Recommendation:        - Discharge patient to home.                        - Resume previous diet.                        - Continue present medications.                        - Perform a colonoscopy today. Procedure Code(s):     --- Professional ---                        [phone], Esophagogastroduodenoscopy, flexible,                         transoral; diagnostic, including collection of                         specimen(s) by brushing or washing, when performed                         (separate procedure) Diagnosis Code(s):     --- Professional ---                        R12, Heartburn CPT copyright 2022 American Medical Association. All rights reserved. The codes documented in this report are preliminary and upon coder review may  be revised to meet current compliance requirements. Midge Minium MD, MD 03/11/2023 10:19:36 AM This report has been signed electronically. Number of Addenda: 0 Note Initiated On: 03/11/2023 10:02 AM Estimated Blood Loss:  Estimated blood loss: none.  [facility]

## 2023-03-11 NOTE — Transfer of Care (Signed)
Immediate Anesthesia Transfer of Care Note  Patient: Mitchell Powell  Procedure(s) Performed: COLONOSCOPY WITH PROPOFOL ESOPHAGOGASTRODUODENOSCOPY (EGD) WITH PROPOFOL POLYPECTOMY  Patient Location: PACU  Anesthesia Type:General  Level of Consciousness: drowsy  Airway & Oxygen Therapy: Patient Spontanous Breathing and Patient connected to nasal cannula oxygen  Post-op Assessment: Report given to RN and Post -op Vital signs reviewed and stable  Post vital signs: Reviewed and stable  Last Vitals:  Vitals Value Taken Time  BP    Temp    Pulse    Resp    SpO2      Last Pain:  Vitals:   03/11/23 0950  TempSrc: Temporal  PainSc: 0-No pain         Complications: No notable events documented.

## 2023-03-11 NOTE — Anesthesia Procedure Notes (Addendum)
Date/Time: 03/11/2023 10:10 AM  Performed by: Malva Cogan, CRNAPre-anesthesia Checklist: Patient identified, Emergency Drugs available, Suction available, Patient being monitored and Timeout performed Patient Re-evaluated:Patient Re-evaluated prior to induction Oxygen Delivery Method: Nasal cannula Induction Type: IV induction Airway Equipment and Method: Bite block Placement Confirmation: CO2 detector and positive ETCO2

## 2023-03-11 NOTE — Op Note (Signed)
[facility] Gastroenterology Patient Name: Mitchell Powell Procedure Date: 03/11/2023 10:01 AM MRN: [id number] Account #: [id number] Date of Birth: Nov 09, 1950 Admit Type: Outpatient Age: 73 Room: [facility] ENDO ROOM 4 Gender: Male Note Status: Finalized Instrument Name: Prentice Docker [id number] Procedure:             Colonoscopy Indications:           High risk colon cancer surveillance: Personal history                         of colonic polyps Providers:             Midge Minium MD, MD Referring MD:          Duane Lope. Judithann Sheen, MD (Referring MD) Medicines:             Propofol per Anesthesia Complications:         No immediate complications. Procedure:             Pre-Anesthesia Assessment:                        - Prior to the procedure, a History and Physical was                         performed, and patient medications and allergies were                         reviewed. The patient's tolerance of previous                         anesthesia was also reviewed. The risks and benefits                         of the procedure and the sedation options and risks                         were discussed with the patient. All questions were                         answered, and informed consent was obtained. Prior                         Anticoagulants: The patient has taken no anticoagulant                         or antiplatelet agents. ASA Grade Assessment: II - A                         patient with mild systemic disease. After reviewing                         the risks and benefits, the patient was deemed in                         satisfactory condition to undergo the procedure.                        After obtaining informed consent, the colonoscope was  passed under direct vision. Throughout the procedure,                         the patient's blood pressure, pulse, and oxygen                         saturations were monitored continuously. The                          Colonoscope was introduced through the anus and                         advanced to the the cecum, identified by appendiceal                         orifice and ileocecal valve. The colonoscopy was                         performed without difficulty. The patient tolerated                         the procedure well. The quality of the bowel                         preparation was good. Findings:      The perianal and digital rectal examinations were normal.      An 8 mm polyp was found in the cecum. The polyp was sessile. The polyp       was removed with a cold snare. Resection and retrieval were complete.      Two sessile polyps were found in the ascending colon. The polyps were 3       to 4 mm in size. These polyps were removed with a cold snare. Resection       and retrieval were complete.      A 3 mm polyp was found in the descending colon. The polyp was sessile.       The polyp was removed with a cold snare. Resection and retrieval were       complete.      Non-bleeding internal hemorrhoids were found during retroflexion. The       hemorrhoids were Grade I (internal hemorrhoids that do not prolapse). Impression:            - One 8 mm polyp in the cecum, removed with a cold                         snare. Resected and retrieved.                        - Two 3 to 4 mm polyps in the ascending colon, removed                         with a cold snare. Resected and retrieved.                        - One 3 mm polyp in the descending colon, removed with                         a  cold snare. Resected and retrieved.                        - Non-bleeding internal hemorrhoids. Recommendation:        - Discharge patient to home.                        - Resume previous diet.                        - Continue present medications.                        - Await pathology results.                        - Repeat colonoscopy in 5 years for surveillance. Procedure Code(s):     ---  Professional ---                        [phone], Colonoscopy, flexible; with removal of                         tumor(s), polyp(s), or other lesion(s) by snare                         technique Diagnosis Code(s):     --- Professional ---                        Z86.010, Personal history of colonic polyps                        D12.4, Benign neoplasm of descending colon CPT copyright 2022 American Medical Association. All rights reserved. The codes documented in this report are preliminary and upon coder review may  be revised to meet current compliance requirements. Midge Minium MD, MD 03/11/2023 10:35:22 AM This report has been signed electronically. Number of Addenda: 0 Note Initiated On: 03/11/2023 10:01 AM Scope Withdrawal Time: 0 hours 4 minutes 57 seconds  Total Procedure Duration: 0 hours 11 minutes 51 seconds  Estimated Blood Loss:  Estimated blood loss: none.      [facility]

## 2023-03-11 NOTE — Anesthesia Preprocedure Evaluation (Signed)
Anesthesia Evaluation  Patient identified by MRN, date of birth, ID band Patient awake    Reviewed: Allergy & Precautions, H&P , NPO status , Patient's Chart, lab work & pertinent test results  Airway Mallampati: III  TM Distance: >3 FB Neck ROM: full    Dental  (+) Missing   Pulmonary sleep apnea , COPD, former smoker   Pulmonary exam normal        Cardiovascular hypertension, Normal cardiovascular exam     Neuro/Psych  PSYCHIATRIC DISORDERS      negative neurological ROS     GI/Hepatic Neg liver ROS,GERD  Controlled,,  Endo/Other  Hypothyroidism    Renal/GU negative Renal ROS  negative genitourinary   Musculoskeletal   Abdominal Normal abdominal exam  (+)   Peds  Hematology negative hematology ROS (+)   Anesthesia Other Findings Past Medical History: No date: Anxiety No date: Depression No date: GERD (gastroesophageal reflux disease) No date: Hyperlipidemia No date: Hypertension No date: Hypothyroidism No date: Sleep apnea  Past Surgical History: 12/02/2011: ABDOMINAL AORTIC ANEURYSM REPAIR 05/10/2018: COLONOSCOPY WITH PROPOFOL; N/A     Comment:  Procedure: COLONOSCOPY WITH PROPOFOL;  Surgeon: Midge Minium, MD;  Location: [facility] ENDOSCOPY;  Service:               Endoscopy;  Laterality: N/A; No date: ESOPHAGOGASTRODUODENOSCOPY 11/02/2022: HOLEP-LASER ENUCLEATION OF THE PROSTATE WITH MORCELLATION;  N/A     Comment:  Procedure: HOLEP-LASER ENUCLEATION OF THE PROSTATE WITH               MORCELLATION;  Surgeon: Vanna Scotland, MD;  Location:               [facility] ORS;  Service: Urology;  Laterality: N/A; No date: JOINT REPLACEMENT; Left     Comment:  hip No date: TONSILLECTOMY AND ADENOIDECTOMY  BMI    Body Mass Index: 27.12 kg/m      Reproductive/Obstetrics negative OB ROS                             Anesthesia Physical Anesthesia Plan  ASA: 3  Anesthesia Plan:  General   Post-op Pain Management: Minimal or no pain anticipated   Induction: Intravenous  PONV Risk Score and Plan: Propofol infusion and TIVA  Airway Management Planned: Natural Airway  Additional Equipment:   Intra-op Plan:   Post-operative Plan:   Informed Consent: I have reviewed the patients History and Physical, chart, labs and discussed the procedure including the risks, benefits and alternatives for the proposed anesthesia with the patient or authorized representative who has indicated his/her understanding and acceptance.     Dental Advisory Given  Plan Discussed with: CRNA and Surgeon  Anesthesia Plan Comments:        Anesthesia Quick Evaluation

## 2023-03-11 NOTE — Anesthesia Postprocedure Evaluation (Signed)
Anesthesia Post Note  Patient: Mitchell Powell  Procedure(s) Performed: COLONOSCOPY WITH PROPOFOL ESOPHAGOGASTRODUODENOSCOPY (EGD) WITH PROPOFOL POLYPECTOMY  Patient location during evaluation: PACU Anesthesia Type: General Level of consciousness: awake and alert Pain management: pain level controlled Vital Signs Assessment: post-procedure vital signs reviewed and stable Respiratory status: spontaneous breathing, nonlabored ventilation and respiratory function stable Cardiovascular status: blood pressure returned to baseline and stable Postop Assessment: no apparent nausea or vomiting Anesthetic complications: no   No notable events documented.   Last Vitals:  Vitals:   03/11/23 1049 03/11/23 1059  BP: 108/61 (!) 146/76  Pulse: (!) 50 (!) 53  Resp: 18 15  Temp:    SpO2: 98% 99%    Last Pain:  Vitals:   03/11/23 1059  TempSrc:   PainSc: 0-No pain                 Foye Deer

## 2023-03-11 NOTE — H&P (Signed)
Mitchell Minium, MD [facility] [address] [place], [place] [address] [phone] Fax : [phone]  Primary Care Physician:  Marguarite Arbour, MD Primary Gastroenterologist:  Dr. Servando Snare  Pre-Procedure History & Physical: HPI:  Mitchell Powell is a 73 y.o. male is here for an endoscopy and colonoscopy.   Past Medical History:  Diagnosis Date   Anxiety    Depression    GERD (gastroesophageal reflux disease)    Hyperlipidemia    Hypertension    Hypothyroidism    Sleep apnea     Past Surgical History:  Procedure Laterality Date   ABDOMINAL AORTIC ANEURYSM REPAIR  12/02/2011   COLONOSCOPY WITH PROPOFOL N/A 05/10/2018   Procedure: COLONOSCOPY WITH PROPOFOL;  Surgeon: Mitchell Minium, MD;  Location: [facility] ENDOSCOPY;  Service: Endoscopy;  Laterality: N/A;   ESOPHAGOGASTRODUODENOSCOPY     HOLEP-LASER ENUCLEATION OF THE PROSTATE WITH MORCELLATION N/A 11/02/2022   Procedure: HOLEP-LASER ENUCLEATION OF THE PROSTATE WITH MORCELLATION;  Surgeon: Vanna Scotland, MD;  Location: [facility] ORS;  Service: Urology;  Laterality: N/A;   JOINT REPLACEMENT Left    hip   TONSILLECTOMY AND ADENOIDECTOMY      Prior to Admission medications   Medication Sig Start Date End Date Taking? Authorizing Provider  clonazePAM (KLONOPIN) 0.5 MG tablet Take 1 tablet (0.5 mg total) by mouth at bedtime. 09/07/22  Yes Lewanda Rife, MD  famotidine (PEPCID) 20 MG tablet Take 1 tablet (20 mg total) by mouth 2 (two) times daily. 09/07/22  Yes Lewanda Rife, MD  finasteride (PROSCAR) 5 MG tablet TAKE 1 TABLET EVERY DAY 05/29/22  Yes McGowan, Carollee Herter A, PA-C  FLUoxetine (PROZAC) 20 MG capsule Take 1 capsule (20 mg total) by mouth daily. 09/08/22  Yes Lewanda Rife, MD  levothyroxine (SYNTHROID) 50 MCG tablet Take 1 tablet (50 mcg total) by mouth daily at 6 (six) AM. 09/08/22  Yes Lewanda Rife, MD  losartan (COZAAR) 50 MG tablet Take 1 tablet (50 mg total) by mouth daily. 09/07/22  Yes Lewanda Rife, MD   montelukast (SINGULAIR) 10 MG tablet Take 10 mg by mouth at bedtime. 08/19/22  Yes [provider]  Multiple Vitamin (MULTIVITAMIN WITH MINERALS) TABS tablet Take 1 tablet by mouth daily. 09/08/22  Yes Lewanda Rife, MD  Omega-3 Fatty Acids (FISH OIL) 1000 MG CPDR Take 1 capsule by mouth daily.   Yes [provider]  pantoprazole (PROTONIX) 40 MG tablet Take 1 tablet (40 mg total) by mouth daily. 11/12/22  Yes Mitchell Minium, MD  pravastatin (PRAVACHOL) 40 MG tablet Take 1 tablet (40 mg total) by mouth daily at 6 PM. 09/07/22  Yes Lewanda Rife, MD  fluticasone (FLONASE) 50 MCG/ACT nasal spray Place 1 spray into both nostrils as needed. 10/13/16   [provider]  hydrOXYzine (ATARAX) 25 MG tablet Take 1 tablet (25 mg total) by mouth 3 (three) times daily as needed for anxiety. 09/07/22   Lewanda Rife, MD  loratadine (CLARITIN) 10 MG tablet Take 1 tablet (10 mg total) by mouth daily. 09/08/22   Lewanda Rife, MD  mirtazapine (REMERON) 15 MG tablet Take 7.5-15 mg by mouth at bedtime as needed.    [provider]  tadalafil (CIALIS) 20 MG tablet Take 1 tablet (20 mg total) by mouth daily as needed. 12/15/22   Vanna Scotland, MD  white petrolatum (VASELINE) OINT Apply 1 Application topically as needed (dry nares). 09/07/22   Lewanda Rife, MD    Allergies as of 02/08/2023 - Review Complete 02/08/2023  Allergen Reaction Noted  Prednisone  12/26/2014    Family History  Problem Relation Age of Onset   Varicose Veins Father    Heart attack Brother    Diabetes Brother     Social History   Socioeconomic History   Marital status: Widowed    Spouse name: Not on file   Number of children: Not on file   Years of education: Not on file   Highest education level: Not on file  Occupational History   Not on file  Tobacco Use   Smoking status: Former    Current packs/day: 0.50    Types: Cigarettes    Passive exposure: Never   Smokeless tobacco:  Never  Vaping Use   Vaping status: Never Used  Substance and Sexual Activity   Alcohol use: Yes    Comment: occ   Drug use: No   Sexual activity: Not on file  Other Topics Concern   Not on file  Social History Narrative   Not on file   Social Drivers of Health   Financial Resource Strain: Low Risk  (07/16/2022)   Received from [facility], [facility]   Overall Financial Resource Strain (CARDIA)    Difficulty of Paying Living Expenses: Not hard at all  Food Insecurity: No Food Insecurity (09/01/2022)   Hunger Vital Sign    Worried About Running Out of Food in the Last Year: Never true    Ran Out of Food in the Last Year: Never true  Transportation Needs: No Transportation Needs (09/01/2022)   PRAPARE - Administrator, Civil Service (Medical): No    Lack of Transportation (Non-Medical): No  Physical Activity: Not on file  Stress: Not on file  Social Connections: Not on file  Intimate Partner Violence: Not At Risk (09/01/2022)   Humiliation, Afraid, Rape, and Kick questionnaire    Fear of Current or Ex-Partner: No    Emotionally Abused: No    Physically Abused: No    Sexually Abused: No    Review of Systems: See HPI, otherwise negative ROS  Physical Exam: BP (!) 147/83   Pulse (!) 112   Temp (!) 96.5 F (35.8 C) (Temporal)   Resp 16   Ht 6' (1.829 m)   Wt 90.7 kg   SpO2 98%   BMI 27.12 kg/m  General:   Alert,  pleasant and cooperative in NAD Head:  Normocephalic and atraumatic. Neck:  Supple; no masses or thyromegaly. Lungs:  Clear throughout to auscultation.    Heart:  Regular rate and rhythm. Abdomen:  Soft, nontender and nondistended. Normal bowel sounds, without guarding, and without rebound.   Neurologic:  Alert and  oriented x4;  grossly normal neurologically.  Impression/Plan: [name] is here for an endoscopy and colonoscopy to be performed for a history of adenomatous polyps on 2020 and GERD   Risks,  benefits, limitations, and alternatives regarding  endoscopy and colonoscopy have been reviewed with the patient.  Questions have been answered.  All parties agreeable.   Mitchell Minium, MD  03/11/2023, 10:03 AM

## 2023-03-12 ENCOUNTER — Encounter: Payer: Self-pay | Admitting: Urology

## 2023-03-12 ENCOUNTER — Encounter: Payer: Self-pay | Admitting: Gastroenterology

## 2023-03-14 LAB — SURGICAL PATHOLOGY

## 2023-03-17 ENCOUNTER — Encounter: Payer: Self-pay | Admitting: Gastroenterology

## 2023-03-18 DIAGNOSIS — H524 Presbyopia: Secondary | ICD-10-CM | POA: Diagnosis not present

## 2023-03-29 DIAGNOSIS — G4733 Obstructive sleep apnea (adult) (pediatric): Secondary | ICD-10-CM | POA: Diagnosis not present

## 2023-03-29 DIAGNOSIS — R519 Headache, unspecified: Secondary | ICD-10-CM | POA: Diagnosis not present

## 2023-03-29 DIAGNOSIS — Z79899 Other long term (current) drug therapy: Secondary | ICD-10-CM | POA: Diagnosis not present

## 2023-03-30 DIAGNOSIS — M791 Myalgia, unspecified site: Secondary | ICD-10-CM | POA: Diagnosis not present

## 2023-03-30 DIAGNOSIS — M9901 Segmental and somatic dysfunction of cervical region: Secondary | ICD-10-CM | POA: Diagnosis not present

## 2023-03-30 DIAGNOSIS — M5030 Other cervical disc degeneration, unspecified cervical region: Secondary | ICD-10-CM | POA: Diagnosis not present

## 2023-03-30 DIAGNOSIS — M62838 Other muscle spasm: Secondary | ICD-10-CM | POA: Diagnosis not present

## 2023-03-30 DIAGNOSIS — M25562 Pain in left knee: Secondary | ICD-10-CM | POA: Diagnosis not present

## 2023-03-30 DIAGNOSIS — M542 Cervicalgia: Secondary | ICD-10-CM | POA: Diagnosis not present

## 2023-03-30 DIAGNOSIS — M5451 Vertebrogenic low back pain: Secondary | ICD-10-CM | POA: Diagnosis not present

## 2023-03-30 DIAGNOSIS — R293 Abnormal posture: Secondary | ICD-10-CM | POA: Diagnosis not present

## 2023-03-30 DIAGNOSIS — M1711 Unilateral primary osteoarthritis, right knee: Secondary | ICD-10-CM | POA: Diagnosis not present

## 2023-03-30 DIAGNOSIS — M25561 Pain in right knee: Secondary | ICD-10-CM | POA: Diagnosis not present

## 2023-03-30 DIAGNOSIS — M545 Low back pain, unspecified: Secondary | ICD-10-CM | POA: Diagnosis not present

## 2023-03-30 DIAGNOSIS — M9902 Segmental and somatic dysfunction of thoracic region: Secondary | ICD-10-CM | POA: Diagnosis not present

## 2023-04-21 ENCOUNTER — Encounter: Payer: Self-pay | Admitting: Urology

## 2023-04-21 DIAGNOSIS — N5201 Erectile dysfunction due to arterial insufficiency: Secondary | ICD-10-CM

## 2023-04-21 DIAGNOSIS — N401 Enlarged prostate with lower urinary tract symptoms: Secondary | ICD-10-CM

## 2023-04-21 MED ORDER — TADALAFIL 20 MG PO TABS: 20.0000 mg | ORAL_TABLET | Freq: Every day | ORAL | 3 refills | Status: DC | PRN

## 2023-04-21 MED ORDER — FINASTERIDE 5 MG PO TABS: 5.0000 mg | ORAL_TABLET | Freq: Every day | ORAL | 3 refills | Status: AC

## 2023-04-28 DIAGNOSIS — E079 Disorder of thyroid, unspecified: Secondary | ICD-10-CM | POA: Diagnosis not present

## 2023-04-28 DIAGNOSIS — E785 Hyperlipidemia, unspecified: Secondary | ICD-10-CM | POA: Diagnosis not present

## 2023-04-28 DIAGNOSIS — Z0000 Encounter for general adult medical examination without abnormal findings: Secondary | ICD-10-CM | POA: Diagnosis not present

## 2023-04-28 DIAGNOSIS — I10 Essential (primary) hypertension: Secondary | ICD-10-CM | POA: Diagnosis not present

## 2023-04-28 DIAGNOSIS — F32A Depression, unspecified: Secondary | ICD-10-CM | POA: Diagnosis not present

## 2023-04-28 MED ORDER — TADALAFIL 5 MG PO TABS
5.0000 mg | ORAL_TABLET | Freq: Every day | ORAL | 11 refills | Status: AC | PRN
Start: 2023-04-28 — End: ?

## 2023-04-28 NOTE — Addendum Note (Signed)
 Addended by: Vanna Scotland on: 04/28/2023 01:19 PM   Modules accepted: Orders

## 2023-04-28 NOTE — Telephone Encounter (Signed)
 Forwarding to Dr Apolinar Junes so she can see what patients wants to do.

## 2023-04-29 DIAGNOSIS — M79672 Pain in left foot: Secondary | ICD-10-CM | POA: Diagnosis not present

## 2023-04-29 DIAGNOSIS — D2372 Other benign neoplasm of skin of left lower limb, including hip: Secondary | ICD-10-CM | POA: Diagnosis not present

## 2023-05-13 DIAGNOSIS — K080 Exfoliation of teeth due to systemic causes: Secondary | ICD-10-CM | POA: Diagnosis not present

## 2023-06-03 DIAGNOSIS — E782 Mixed hyperlipidemia: Secondary | ICD-10-CM | POA: Diagnosis not present

## 2023-06-03 DIAGNOSIS — M25562 Pain in left knee: Secondary | ICD-10-CM | POA: Diagnosis not present

## 2023-06-03 DIAGNOSIS — M170 Bilateral primary osteoarthritis of knee: Secondary | ICD-10-CM | POA: Diagnosis not present

## 2023-06-03 DIAGNOSIS — M25561 Pain in right knee: Secondary | ICD-10-CM | POA: Diagnosis not present

## 2023-06-11 DIAGNOSIS — G4733 Obstructive sleep apnea (adult) (pediatric): Secondary | ICD-10-CM | POA: Diagnosis not present

## 2023-06-23 DIAGNOSIS — M5416 Radiculopathy, lumbar region: Secondary | ICD-10-CM | POA: Diagnosis not present

## 2023-07-11 DIAGNOSIS — G4733 Obstructive sleep apnea (adult) (pediatric): Secondary | ICD-10-CM | POA: Diagnosis not present

## 2023-07-13 ENCOUNTER — Encounter (INDEPENDENT_AMBULATORY_CARE_PROVIDER_SITE_OTHER): Payer: Self-pay

## 2023-07-22 DIAGNOSIS — G4733 Obstructive sleep apnea (adult) (pediatric): Secondary | ICD-10-CM | POA: Diagnosis not present

## 2023-07-28 DIAGNOSIS — M1612 Unilateral primary osteoarthritis, left hip: Secondary | ICD-10-CM | POA: Diagnosis not present

## 2023-08-10 DIAGNOSIS — J431 Panlobular emphysema: Secondary | ICD-10-CM | POA: Diagnosis not present

## 2023-08-10 DIAGNOSIS — I10 Essential (primary) hypertension: Secondary | ICD-10-CM | POA: Diagnosis not present

## 2023-08-10 DIAGNOSIS — Z9889 Other specified postprocedural states: Secondary | ICD-10-CM | POA: Diagnosis not present

## 2023-08-10 DIAGNOSIS — G4733 Obstructive sleep apnea (adult) (pediatric): Secondary | ICD-10-CM | POA: Diagnosis not present

## 2023-08-11 ENCOUNTER — Encounter: Payer: Self-pay | Admitting: Physician Assistant

## 2023-08-11 ENCOUNTER — Ambulatory Visit: Admitting: Physician Assistant

## 2023-08-11 VITALS — BP 126/78

## 2023-08-11 DIAGNOSIS — L821 Other seborrheic keratosis: Secondary | ICD-10-CM

## 2023-08-11 DIAGNOSIS — D1801 Hemangioma of skin and subcutaneous tissue: Secondary | ICD-10-CM | POA: Diagnosis not present

## 2023-08-11 DIAGNOSIS — L570 Actinic keratosis: Secondary | ICD-10-CM

## 2023-08-11 DIAGNOSIS — Z8589 Personal history of malignant neoplasm of other organs and systems: Secondary | ICD-10-CM

## 2023-08-11 DIAGNOSIS — L578 Other skin changes due to chronic exposure to nonionizing radiation: Secondary | ICD-10-CM

## 2023-08-11 DIAGNOSIS — L814 Other melanin hyperpigmentation: Secondary | ICD-10-CM

## 2023-08-11 DIAGNOSIS — G4733 Obstructive sleep apnea (adult) (pediatric): Secondary | ICD-10-CM | POA: Diagnosis not present

## 2023-08-11 DIAGNOSIS — Z1283 Encounter for screening for malignant neoplasm of skin: Secondary | ICD-10-CM

## 2023-08-11 DIAGNOSIS — W908XXA Exposure to other nonionizing radiation, initial encounter: Secondary | ICD-10-CM

## 2023-08-11 DIAGNOSIS — Z86007 Personal history of in-situ neoplasm of skin: Secondary | ICD-10-CM

## 2023-08-11 DIAGNOSIS — D229 Melanocytic nevi, unspecified: Secondary | ICD-10-CM

## 2023-08-11 NOTE — Progress Notes (Signed)
   Follow-Up Visit   Subjective  Mitchell Powell is a 73 y.o. male who presents for the following: Skin Cancer Screening and Full Body Skin Exam - He has some scaly spots of face. History of SCC in situ.  The patient presents for Total-Body Skin Exam (TBSE) for skin cancer screening and mole check. The patient has spots, moles and lesions to be evaluated, some may be new or changing and the patient may have concern these could be cancer.    The following portions of the chart were reviewed this encounter and updated as appropriate: medications, allergies, medical history  Review of Systems:  No other skin or systemic complaints except as noted in HPI or Assessment and Plan.  Objective  Well appearing patient in no apparent distress; mood and affect are within normal limits.  A full examination was performed including scalp, head, eyes, ears, nose, lips, neck, chest, axillae, abdomen, back, buttocks, bilateral upper extremities, bilateral lower extremities, hands, feet, fingers, toes, fingernails, and toenails. All findings within normal limits unless otherwise noted below.   Relevant physical exam findings are noted in the Assessment and Plan.  Posterior neck, face, ears, arms (18) Erythematous thin papules/macules with gritty scale.   Assessment & Plan   SKIN CANCER SCREENING PERFORMED TODAY.  ACTINIC DAMAGE - Chronic condition, secondary to cumulative UV/sun exposure - diffuse scaly erythematous macules with underlying dyspigmentation - Recommend daily broad spectrum sunscreen SPF 30+ to sun-exposed areas, reapply every 2 hours as needed.  - Staying in the shade or wearing long sleeves, sun glasses (UVA+UVB protection) and wide brim hats (4-inch brim around the entire circumference of the hat) are also recommended for sun protection.  - Call for new or changing lesions.  LENTIGINES, SEBORRHEIC KERATOSES, CHERRY ANGIOMAS - Benign normal skin lesions - Benign-appearing - Call for  any changes  MELANOCYTIC NEVI - Tan-brown and/or pink-flesh-colored symmetric macules and papules - Benign appearing on exam today - Observation - Call clinic for new or changing moles - Recommend daily use of broad spectrum spf 30+ sunscreen to sun-exposed areas.   HISTORY OF SQUAMOUS CELL CARCINOMA IN SITU OF THE SKIN - No evidence of recurrence today - Recommend regular full body skin exams - Recommend daily broad spectrum sunscreen SPF 30+ to sun-exposed areas, reapply every 2 hours as needed.  - Call if any new or changing lesions are noted between office visits    AK (ACTINIC KERATOSIS) (18) Posterior neck, face, ears, arms (18) Destruction of lesion - Posterior neck, face, ears, arms (18) Complexity: simple   Destruction method: cryotherapy   Informed consent: discussed and consent obtained   Timeout:  patient name, date of birth, surgical site, and procedure verified Lesion destroyed using liquid nitrogen: Yes   Region frozen until ice ball extended beyond lesion: Yes   Outcome: patient tolerated procedure well with no complications   Post-procedure details: wound care instructions given   SCREENING EXAM FOR SKIN CANCER   HISTORY OF SQUAMOUS CELL CARCINOMA   MULTIPLE BENIGN NEVI   LENTIGINES   SEBORRHEIC KERATOSIS   CHERRY ANGIOMA   ACTINIC SKIN DAMAGE   Return in about 6 months (around 02/10/2024) for Follow up.  I, Eliot Guernsey, CMA, am acting as scribe for Tian Mcmurtrey K, PA-C .   Documentation: I have reviewed the above documentation for accuracy and completeness, and I agree with the above.  Levita Monical K, PA-C

## 2023-08-11 NOTE — Patient Instructions (Signed)

## 2023-08-22 DIAGNOSIS — G4733 Obstructive sleep apnea (adult) (pediatric): Secondary | ICD-10-CM | POA: Diagnosis not present

## 2023-09-10 DIAGNOSIS — G4733 Obstructive sleep apnea (adult) (pediatric): Secondary | ICD-10-CM | POA: Diagnosis not present

## 2023-09-21 DIAGNOSIS — G4733 Obstructive sleep apnea (adult) (pediatric): Secondary | ICD-10-CM | POA: Diagnosis not present

## 2023-10-06 NOTE — Progress Notes (Signed)
 10/07/2023 4:44 PM   Mitchell Powell [birth year] [id number]  Referring provider: Auston Reyes BIRCH, MD [address] [facility] [place],  [place] [address]  Urological history: 1. BPH with LU TS - PSA (07/2022) 0.97 - HoLEP (10/2022) - pathology negative  - Tadalafil  5 mg daily and finasteride  5 mg daily  2. ED - Tadalafil  20 mg, on-demand-dosing   Chief Complaint  Patient presents with   Benign prostatic hyperplasia with lower urinary tract sympt   HPI: Mitchell Powell is a 73 y.o. man who presents today for prostate problems.   Previous records reviewed.   He has a cleanout for prostate problems, but for erection issues.  He states he is having issues erectile dysfunction, but he purchased some sublingual tablets that have sildenafil, tadalafil  in Apomorphine and they are working great for him.  UA bland  PVR 0 mL     PMH: Past Medical History:  Diagnosis Date   Anxiety    Depression    GERD (gastroesophageal reflux disease)    Hyperlipidemia    Hypertension    Hypothyroidism    Sleep apnea     Surgical History: Past Surgical History:  Procedure Laterality Date   ABDOMINAL AORTIC ANEURYSM REPAIR  12/02/2011   COLONOSCOPY WITH PROPOFOL  N/A 05/10/2018   Procedure: COLONOSCOPY WITH PROPOFOL ;  Surgeon: Jinny Carmine, MD;  Location: [facility] ENDOSCOPY;  Service: Endoscopy;  Laterality: N/A;   COLONOSCOPY WITH PROPOFOL  N/A 03/11/2023   Procedure: COLONOSCOPY WITH PROPOFOL ;  Surgeon: Jinny Carmine, MD;  Location: [facility] ENDOSCOPY;  Service: Endoscopy;  Laterality: N/A;   ESOPHAGOGASTRODUODENOSCOPY     ESOPHAGOGASTRODUODENOSCOPY (EGD) WITH PROPOFOL  N/A 03/11/2023   Procedure: ESOPHAGOGASTRODUODENOSCOPY (EGD) WITH PROPOFOL ;  Surgeon: Jinny Carmine, MD;  Location: [facility] ENDOSCOPY;  Service: Endoscopy;  Laterality: N/A;   HOLEP-LASER ENUCLEATION OF THE PROSTATE WITH MORCELLATION N/A 11/02/2022   Procedure: HOLEP-LASER ENUCLEATION OF THE PROSTATE WITH MORCELLATION;   Surgeon: Penne Knee, MD;  Location: [facility] ORS;  Service: Urology;  Laterality: N/A;   JOINT REPLACEMENT Left    hip   POLYPECTOMY  03/11/2023   Procedure: POLYPECTOMY;  Surgeon: Jinny Carmine, MD;  Location: [facility] ENDOSCOPY;  Service: Endoscopy;;   TONSILLECTOMY AND ADENOIDECTOMY      Home Medications:  Allergies as of 10/07/2023       Reactions   Prednisone     Other reaction(s): Other (See Comments) irritiability        Medication List        Accurate as of October 07, 2023  4:44 PM. If you have any questions, ask your nurse or doctor.          STOP taking these medications    fluticasone 50 MCG/ACT nasal spray Commonly known as: FLONASE Stopped by: CLOTILDA CORNWALL   loratadine  10 MG tablet Commonly known as: CLARITIN  Stopped by: Winter Jocelyn   montelukast 10 MG tablet Commonly known as: SINGULAIR Stopped by: Felecity Lemaster   white petrolatum  Oint Commonly known as: VASELINE Stopped by: Eirik Schueler       TAKE these medications    clonazePAM  0.5 MG tablet Commonly known as: KLONOPIN  Take 1 tablet (0.5 mg total) by mouth at bedtime.   famotidine  20 MG tablet Commonly known as: PEPCID  Take 1 tablet (20 mg total) by mouth 2 (two) times daily.   finasteride  5 MG tablet Commonly known as: PROSCAR  Take 1 tablet (5 mg total) by mouth daily.   Fish Oil 1000 MG Cpdr Take 1 capsule by mouth  daily.   FLUoxetine  20 MG capsule Commonly known as: PROZAC  Take 1 capsule (20 mg total) by mouth daily.   hydrOXYzine  25 MG tablet Commonly known as: ATARAX  Take 1 tablet (25 mg total) by mouth 3 (three) times daily as needed for anxiety.   levothyroxine  50 MCG tablet Commonly known as: SYNTHROID  Take 1 tablet (50 mcg total) by mouth daily at 6 (six) AM.   losartan  50 MG tablet Commonly known as: COZAAR  Take 1 tablet (50 mg total) by mouth daily.   mirtazapine 15 MG tablet Commonly known as: REMERON Take 7.5-15 mg by mouth at bedtime as needed.    multivitamin with minerals Tabs tablet Take 1 tablet by mouth daily.   pantoprazole  40 MG tablet Commonly known as: PROTONIX  Take 1 tablet (40 mg total) by mouth daily.   pravastatin  40 MG tablet Commonly known as: PRAVACHOL  Take 1 tablet (40 mg total) by mouth daily at 6 PM.   tadalafil  5 MG tablet Commonly known as: CIALIS  Take 1 tablet (5 mg total) by mouth daily as needed for erectile dysfunction.        Allergies:  Allergies  Allergen Reactions   Prednisone      Other reaction(s): Other (See Comments) irritiability    Family History: Family History  Problem Relation Age of Onset   Varicose Veins Father    Heart attack Brother    Diabetes Brother     Social History: See HPI for pertinent social history  ROS: Pertinent ROS in HPI  Physical Exam: BP 122/76   Pulse 67   Ht 6' (1.829 m)   Wt 198 lb 1.6 oz (89.9 kg)   BMI 26.87 kg/m   Constitutional:  Well nourished. Alert and oriented, No acute distress. HEENT: [place] AT, moist mucus membranes.  Trachea midline Cardiovascular: No clubbing, cyanosis, or edema. Respiratory: Normal respiratory effort, no increased work of breathing. Neurologic: Grossly intact, no focal deficits, moving all 4 extremities. Psychiatric: Normal mood and affect.  Laboratory Data: See EPIC and HPI  I have reviewed the labs.   Pertinent Imaging:  10/07/23 16:07  Scan Result 0ml    Assessment & Plan:    1. Prostate problems  - He is going to discontinue his finasteride  as he has read it can decrease sexual desire  2.  Erectile dysfunction - I explained that I will call our local compounding pharmacies to see if we can get the same medication for him in [place] as it seems to be very expensive ordering it online  Return for I will call patient with results.  These notes generated with voice recognition software. I apologize for typographical errors.  CLOTILDA HELON RIGGERS  [facility] [address]  [address] [place], [place] [address] [phone]

## 2023-10-07 ENCOUNTER — Encounter: Payer: Self-pay | Admitting: Urology

## 2023-10-07 ENCOUNTER — Ambulatory Visit: Admitting: Urology

## 2023-10-07 VITALS — BP 122/76 | HR 67 | Ht 72.0 in | Wt 198.1 lb

## 2023-10-07 DIAGNOSIS — N401 Enlarged prostate with lower urinary tract symptoms: Secondary | ICD-10-CM

## 2023-10-07 LAB — URINALYSIS, COMPLETE
Bilirubin, UA: NEGATIVE
Glucose, UA: NEGATIVE
Ketones, UA: NEGATIVE
Leukocytes,UA: NEGATIVE
Nitrite, UA: NEGATIVE
Protein,UA: NEGATIVE
RBC, UA: NEGATIVE
Specific Gravity, UA: 1.015 (ref 1.005–1.030)
Urobilinogen, Ur: 0.2 mg/dL (ref 0.2–1.0)
pH, UA: 6 (ref 5.0–7.5)

## 2023-10-07 LAB — MICROSCOPIC EXAMINATION

## 2023-10-07 LAB — BLADDER SCAN AMB NON-IMAGING

## 2023-10-12 DIAGNOSIS — Z125 Encounter for screening for malignant neoplasm of prostate: Secondary | ICD-10-CM | POA: Diagnosis not present

## 2023-10-12 DIAGNOSIS — E782 Mixed hyperlipidemia: Secondary | ICD-10-CM | POA: Diagnosis not present

## 2023-10-12 DIAGNOSIS — E039 Hypothyroidism, unspecified: Secondary | ICD-10-CM | POA: Diagnosis not present

## 2023-10-12 DIAGNOSIS — Z79899 Other long term (current) drug therapy: Secondary | ICD-10-CM | POA: Diagnosis not present

## 2023-10-14 ENCOUNTER — Telehealth: Payer: Self-pay | Admitting: Urology

## 2023-10-14 ENCOUNTER — Other Ambulatory Visit: Payer: Self-pay | Admitting: Urology

## 2023-10-14 MED ORDER — AMBULATORY NON FORMULARY MEDICATION: 0 refills | Status: AC

## 2023-10-15 DIAGNOSIS — I10 Essential (primary) hypertension: Secondary | ICD-10-CM | POA: Diagnosis not present

## 2023-10-15 DIAGNOSIS — Z0000 Encounter for general adult medical examination without abnormal findings: Secondary | ICD-10-CM | POA: Diagnosis not present

## 2023-10-15 DIAGNOSIS — Z1331 Encounter for screening for depression: Secondary | ICD-10-CM | POA: Diagnosis not present

## 2023-10-15 DIAGNOSIS — G4733 Obstructive sleep apnea (adult) (pediatric): Secondary | ICD-10-CM | POA: Diagnosis not present

## 2023-11-09 ENCOUNTER — Encounter: Payer: Self-pay | Admitting: Urology

## 2023-12-07 ENCOUNTER — Other Ambulatory Visit: Payer: Self-pay

## 2023-12-07 DIAGNOSIS — N401 Enlarged prostate with lower urinary tract symptoms: Secondary | ICD-10-CM

## 2023-12-08 ENCOUNTER — Other Ambulatory Visit: Payer: Self-pay

## 2023-12-08 DIAGNOSIS — N401 Enlarged prostate with lower urinary tract symptoms: Secondary | ICD-10-CM

## 2023-12-08 NOTE — Progress Notes (Deleted)
   12/13/2023 2:46 PM   SHAHEEN MENDE 02-24-1950 [id number]  Reason for visit: Follow up ***   HPI: 73 y.o. male, initial follow up with me today, previously seen by ***  Prior HPI:     Physical Exam: There were no vitals taken for this visit.   Constitutional:  Alert and oriented, No acute distress. GU: ***  Laboratory Data: ***  Pertinent Imaging: I have personally viewed and interpreted the ***.    Assessment & Plan:    There are no diagnoses linked to this encounter.     Penne JONELLE Skye, MD  [facility] [address] [place], [place] [address] [phone]

## 2023-12-09 ENCOUNTER — Ambulatory Visit: Payer: Self-pay | Admitting: Urology

## 2023-12-09 LAB — PSA: Prostate Specific Ag, Serum: 0.3 ng/mL (ref 0.0–4.0)

## 2023-12-13 ENCOUNTER — Ambulatory Visit: Admitting: Urology

## 2023-12-14 ENCOUNTER — Ambulatory Visit (INDEPENDENT_AMBULATORY_CARE_PROVIDER_SITE_OTHER): Payer: Medicare HMO | Admitting: Vascular Surgery

## 2023-12-14 ENCOUNTER — Ambulatory Visit (INDEPENDENT_AMBULATORY_CARE_PROVIDER_SITE_OTHER): Payer: Self-pay

## 2023-12-14 ENCOUNTER — Encounter (INDEPENDENT_AMBULATORY_CARE_PROVIDER_SITE_OTHER): Payer: Self-pay | Admitting: Vascular Surgery

## 2023-12-14 ENCOUNTER — Ambulatory Visit (INDEPENDENT_AMBULATORY_CARE_PROVIDER_SITE_OTHER): Payer: Medicare HMO

## 2023-12-14 VITALS — BP 129/78 | HR 51 | Ht 72.0 in | Wt 203.2 lb

## 2023-12-14 DIAGNOSIS — I10 Essential (primary) hypertension: Secondary | ICD-10-CM

## 2023-12-14 DIAGNOSIS — I7143 Infrarenal abdominal aortic aneurysm, without rupture: Secondary | ICD-10-CM | POA: Diagnosis not present

## 2023-12-14 DIAGNOSIS — E782 Mixed hyperlipidemia: Secondary | ICD-10-CM | POA: Diagnosis not present

## 2023-12-14 DIAGNOSIS — I724 Aneurysm of artery of lower extremity: Secondary | ICD-10-CM

## 2023-12-14 NOTE — Progress Notes (Signed)
 MRN : [id number]  Mitchell Powell is a 73 y.o. (11-13-1950) male who presents with chief complaint of  Chief Complaint  Patient presents with   Follow-up    1 year EVAR/BLE popliteal  .  History of Present Illness: Patient returns today in follow up of multiple vascular issues.  He has had a previous endovascular abdominal aortic aneurysm repair.  He has no current aneurysm related symptoms. Specifically, the patient denies new back or abdominal pain, or signs of peripheral embolization.  His aortic duplex today shows a stable 3.65 cm in maximal diameter aortic sac.  His endograft is patent without an obvious endoleak. He is also followed for his popliteal artery aneurysms.  He has some mild lower extremity swelling intermittently but nothing that is traumatic or bothersome.  No ischemic symptoms. Duplex today shows stable popliteal artery aneurysms measuring approximately 1.1 cm in diameter on the right and 1.04 cm in diameter on the left.  He has triphasic waveforms through both popliteal arteries with no evidence of flow limitation.  Current Outpatient Medications  Medication Sig Dispense Refill   clonazePAM  (KLONOPIN ) 0.5 MG tablet Take 1 tablet (0.5 mg total) by mouth at bedtime. 30 tablet 0   famotidine  (PEPCID ) 20 MG tablet Take 1 tablet (20 mg total) by mouth 2 (two) times daily. 60 tablet 0   FLUoxetine  (PROZAC ) 20 MG capsule Take 1 capsule (20 mg total) by mouth daily. 30 capsule 0   levothyroxine  (SYNTHROID ) 50 MCG tablet Take 1 tablet (50 mcg total) by mouth daily at 6 (six) AM. 30 tablet 0   losartan  (COZAAR ) 50 MG tablet Take 1 tablet (50 mg total) by mouth daily. 30 tablet 0   mirtazapine (REMERON) 15 MG tablet Take 7.5-15 mg by mouth at bedtime as needed.     Multiple Vitamin (MULTIVITAMIN WITH MINERALS) TABS tablet Take 1 tablet by mouth daily. 30 tablet 0   Omega-3 Fatty Acids (FISH OIL) 1000 MG CPDR Take 1 capsule by mouth daily.     pantoprazole  (PROTONIX ) 40 MG tablet  Take 1 tablet (40 mg total) by mouth daily. 90 tablet 1   pravastatin  (PRAVACHOL ) 40 MG tablet Take 1 tablet (40 mg total) by mouth daily at 6 PM. 30 tablet 0   tadalafil  (CIALIS ) 5 MG tablet Take 1 tablet (5 mg total) by mouth daily as needed for erectile dysfunction. 30 tablet 11   AMBULATORY NON FORMULARY MEDICATION Medication Name: Sildenafil 80 mg/tadalafil  22 mg/apomorphine 3 mg troches  Dosage: 1/4 troche 30 minutes prior to intercourse  Qty #15 Troches   Refills 0  [facility] [phone] Fax [phone] 10 mL 0   finasteride  (PROSCAR ) 5 MG tablet Take 1 tablet (5 mg total) by mouth daily. 90 tablet 3   hydrOXYzine  (ATARAX ) 25 MG tablet Take 1 tablet (25 mg total) by mouth 3 (three) times daily as needed for anxiety. 30 tablet 0   No current facility-administered medications for this visit.    Past Medical History:  Diagnosis Date   Anxiety    Depression    GERD (gastroesophageal reflux disease)    Hyperlipidemia    Hypertension    Hypothyroidism    Sleep apnea     Past Surgical History:  Procedure Laterality Date   ABDOMINAL AORTIC ANEURYSM REPAIR  12/02/2011   COLONOSCOPY WITH PROPOFOL  N/A 05/10/2018   Procedure: COLONOSCOPY WITH PROPOFOL ;  Surgeon: Jinny Carmine, MD;  Location: [facility] ENDOSCOPY;  Service: Endoscopy;  Laterality: N/A;   COLONOSCOPY WITH  PROPOFOL  N/A 03/11/2023   Procedure: COLONOSCOPY WITH PROPOFOL ;  Surgeon: Jinny Carmine, MD;  Location: [facility] ENDOSCOPY;  Service: Endoscopy;  Laterality: N/A;   ESOPHAGOGASTRODUODENOSCOPY     ESOPHAGOGASTRODUODENOSCOPY (EGD) WITH PROPOFOL  N/A 03/11/2023   Procedure: ESOPHAGOGASTRODUODENOSCOPY (EGD) WITH PROPOFOL ;  Surgeon: Jinny Carmine, MD;  Location: [facility] ENDOSCOPY;  Service: Endoscopy;  Laterality: N/A;   HOLEP-LASER ENUCLEATION OF THE PROSTATE WITH MORCELLATION N/A 11/02/2022   Procedure: HOLEP-LASER ENUCLEATION OF THE PROSTATE WITH MORCELLATION;  Surgeon: Penne Knee, MD;  Location: [facility] ORS;  Service:  Urology;  Laterality: N/A;   JOINT REPLACEMENT Left    hip   POLYPECTOMY  03/11/2023   Procedure: POLYPECTOMY;  Surgeon: Jinny Carmine, MD;  Location: [facility] ENDOSCOPY;  Service: Endoscopy;;   TONSILLECTOMY AND ADENOIDECTOMY       Social History   Tobacco Use   Smoking status: Former    Current packs/day: 0.50    Types: Cigarettes    Passive exposure: Never   Smokeless tobacco: Never  Vaping Use   Vaping status: Never Used  Substance Use Topics   Alcohol use: Yes    Comment: occ   Drug use: No      Family History  Problem Relation Age of Onset   Varicose Veins Father    Heart attack Brother    Diabetes Brother      Allergies  Allergen Reactions   Prednisone      Other reaction(s): Other (See Comments) irritiability     REVIEW OF SYSTEMS (Negative unless checked)   Constitutional: [] Weight loss  [] Fever  [] Chills Cardiac: [] Chest pain   [] Chest pressure   [] Palpitations   [] Shortness of breath when laying flat   [] Shortness of breath at rest   [] Shortness of breath with exertion. Vascular:  [x] Pain in legs with walking   [x] Pain in legs at rest   [] Pain in legs when laying flat   [] Claudication   [] Pain in feet when walking  [] Pain in feet at rest  [] Pain in feet when laying flat   [] History of DVT   [] Phlebitis   [] Swelling in legs   [] Varicose veins   [] Non-healing ulcers Pulmonary:   [] Uses home oxygen   [] Productive cough   [] Hemoptysis   [] Wheeze  [] COPD   [] Asthma Neurologic:  [] Dizziness  [] Blackouts   [] Seizures   [] History of stroke   [] History of TIA  [] Aphasia   [] Temporary blindness   [] Dysphagia   [] Weakness or numbness in arms   [] Weakness or numbness in legs Musculoskeletal:  [x] Arthritis   [] Joint swelling   [] Joint pain   [] Low back pain Hematologic:  [] Easy bruising  [] Easy bleeding   [] Hypercoagulable state   [] Anemic   Gastrointestinal:  [] Blood in stool   [] Vomiting blood  [x] Gastroesophageal reflux/heartburn   [] Abdominal pain Genitourinary:   [] Chronic kidney disease   [] Difficult urination  [] Frequent urination  [] Burning with urination   [] Hematuria Skin:  [] Rashes   [] Ulcers   [] Wounds Psychological:  [x] History of anxiety   [x]  History of major depression.  Physical Examination  BP 129/78   Pulse (!) 51   Ht 6' (1.829 m)   Wt 203 lb 3.2 oz (92.2 kg)   BMI 27.56 kg/m  Gen:  WD/WN, NAD Head: [place]/AT, No temporalis wasting. Ear/Nose/Throat: Hearing grossly intact, nares w/o erythema or drainage Eyes: Conjunctiva clear. Sclera non-icteric Neck: Supple.  Trachea midline Pulmonary:  Good air movement, no use of accessory muscles.  Cardiac: RRR, no JVD Vascular:  Vessel Right Left  Radial Palpable  Palpable                          PT Palpable Palpable  DP Palpable Palpable   Gastrointestinal: soft, non-tender/non-distended. No guarding/reflex.  Musculoskeletal: M/S 5/5 throughout.  No deformity or atrophy. Trace LE edema. Neurologic: Sensation grossly intact in extremities.  Symmetrical.  Speech is fluent.  Psychiatric: Judgment intact, Mood & affect appropriate for pt's clinical situation. Dermatologic: No rashes or ulcers noted.  No cellulitis or open wounds.      Labs Recent Results (from the past 2160 hours)  Urinalysis, Complete     Status: None   Collection Time: 10/07/23  3:50 PM  Result Value Ref Range   Specific Gravity, UA 1.015 1.005 - 1.030   pH, UA 6.0 5.0 - 7.5   Color, UA Yellow Yellow   Appearance Ur Clear Clear   Leukocytes,UA Negative Negative   Protein,UA Negative Negative/Trace   Glucose, UA Negative Negative   Ketones, UA Negative Negative   RBC, UA Negative Negative   Bilirubin, UA Negative Negative   Urobilinogen, Ur 0.2 0.2 - 1.0 mg/dL   Nitrite, UA Negative Negative   Microscopic Examination Comment     Comment: Microscopic follows if indicated.   Microscopic Examination See below:     Comment: Microscopic was indicated and was performed.  Microscopic Examination      Status: None   Collection Time: 10/07/23  3:50 PM   Urine  Result Value Ref Range   WBC, UA 0-5 0 - 5 /hpf   RBC, Urine 0-2 0 - 2 /hpf   Epithelial Cells (non renal) 0-10 0 - 10 /hpf   Bacteria, UA Few None seen/Few  Bladder Scan (Post Void Residual) in office     Status: None   Collection Time: 10/07/23  4:07 PM  Result Value Ref Range   Scan Result 0ml   PSA     Status: None   Collection Time: 12/08/23  9:39 AM  Result Value Ref Range   Prostate Specific Ag, Serum 0.3 0.0 - 4.0 ng/mL    Comment: Roche ECLIA methodology. According to the American Urological Association, Serum PSA should decrease and remain at undetectable levels after radical prostatectomy. The AUA defines biochemical recurrence as an initial PSA value 0.2 ng/mL or greater followed by a subsequent confirmatory PSA value 0.2 ng/mL or greater. Values obtained with different assay methods or kits cannot be used interchangeably. Results cannot be interpreted as absolute evidence of the presence or absence of malignant disease.     Radiology No results found.  Assessment/Plan  AAA (abdominal aortic aneurysm) without rupture His aortic duplex today shows a stable 3.65 cm in maximal diameter aortic sac.  His endograft is patent without an obvious endoleak. Doing well with appropriate sac diminishment after aneurysm repair.  We will continue to follow this on an annual basis with duplex.  Popliteal artery aneurysm Duplex today shows stable popliteal artery aneurysms measuring approximately 1.1 cm in diameter on the right and 1.04 cm in diameter on the left.  He has triphasic waveforms through both popliteal arteries with no evidence of flow limitation.  Risk of thrombosis or rupture at this size is extremely small and we will continue to monitor this on an annual basis with duplex.  Hypertension blood pressure control important in reducing the progression of atherosclerotic disease and in his case particularly  aneurysmal growth. On appropriate oral medications.     Hyperlipidemia lipid control important  in reducing the progression of atherosclerotic disease. Continue statin therapy  Selinda Gu, MD  12/14/2023 9:03 AM    This note was created with Dragon medical transcription system.  Any errors from dictation are purely unintentional

## 2023-12-14 NOTE — Assessment & Plan Note (Signed)
 His aortic duplex today shows a stable 3.65 cm in maximal diameter aortic sac.  His endograft is patent without an obvious endoleak. Doing well with appropriate sac diminishment after aneurysm repair.  We will continue to follow this on an annual basis with duplex.

## 2023-12-14 NOTE — Assessment & Plan Note (Signed)
 Duplex today shows stable popliteal artery aneurysms measuring approximately 1.1 cm in diameter on the right and 1.04 cm in diameter on the left.  He has triphasic waveforms through both popliteal arteries with no evidence of flow limitation.  Risk of thrombosis or rupture at this size is extremely small and we will continue to monitor this on an annual basis with duplex.

## 2023-12-15 ENCOUNTER — Ambulatory Visit: Payer: Self-pay | Admitting: Urology

## 2024-02-08 ENCOUNTER — Encounter: Payer: Self-pay | Admitting: Physician Assistant

## 2024-02-08 ENCOUNTER — Ambulatory Visit: Admitting: Physician Assistant

## 2024-02-08 VITALS — BP 129/82

## 2024-02-08 DIAGNOSIS — D1801 Hemangioma of skin and subcutaneous tissue: Secondary | ICD-10-CM

## 2024-02-08 DIAGNOSIS — Z8589 Personal history of malignant neoplasm of other organs and systems: Secondary | ICD-10-CM

## 2024-02-08 DIAGNOSIS — D229 Melanocytic nevi, unspecified: Secondary | ICD-10-CM

## 2024-02-08 DIAGNOSIS — C4492 Squamous cell carcinoma of skin, unspecified: Secondary | ICD-10-CM

## 2024-02-08 DIAGNOSIS — L814 Other melanin hyperpigmentation: Secondary | ICD-10-CM

## 2024-02-08 DIAGNOSIS — L570 Actinic keratosis: Secondary | ICD-10-CM

## 2024-02-08 DIAGNOSIS — D485 Neoplasm of uncertain behavior of skin: Secondary | ICD-10-CM

## 2024-02-08 DIAGNOSIS — Z85828 Personal history of other malignant neoplasm of skin: Secondary | ICD-10-CM | POA: Diagnosis not present

## 2024-02-08 DIAGNOSIS — W908XXA Exposure to other nonionizing radiation, initial encounter: Secondary | ICD-10-CM | POA: Diagnosis not present

## 2024-02-08 DIAGNOSIS — Z1283 Encounter for screening for malignant neoplasm of skin: Secondary | ICD-10-CM | POA: Diagnosis not present

## 2024-02-08 DIAGNOSIS — C44622 Squamous cell carcinoma of skin of right upper limb, including shoulder: Secondary | ICD-10-CM | POA: Diagnosis not present

## 2024-02-08 DIAGNOSIS — L821 Other seborrheic keratosis: Secondary | ICD-10-CM | POA: Diagnosis not present

## 2024-02-08 DIAGNOSIS — L578 Other skin changes due to chronic exposure to nonionizing radiation: Secondary | ICD-10-CM

## 2024-02-08 HISTORY — DX: Squamous cell carcinoma of skin, unspecified: C44.92

## 2024-02-08 NOTE — Patient Instructions (Addendum)

## 2024-02-08 NOTE — Progress Notes (Signed)
 Follow-Up Visit   Subjective  Mitchell Powell is a 73 y.o. male ESTABLISHED PATIENT who presents for the following: Skin Cancer Screening and Full Body Skin Exam - History of SCC  The patient presents for Total-Body Skin Exam (TBSE) for skin cancer screening and mole check. The patient has spots, moles and lesions to be evaluated, some may be new or changing and the patient may have concern these could be cancer.    The following portions of the chart were reviewed this encounter and updated as appropriate: medications, allergies, medical history  Review of Systems:  No other skin or systemic complaints except as noted in HPI or Assessment and Plan.  Objective  Well appearing patient in no apparent distress; mood and affect are within normal limits.  A full examination was performed including scalp, head, eyes, ears, nose, lips, neck, chest, axillae, abdomen, back, buttocks, bilateral upper extremities, bilateral lower extremities, hands, feet, fingers, toes, fingernails, and toenails. All findings within normal limits unless otherwise noted below.   Relevant physical exam findings are noted in the Assessment and Plan.  Temples, cheeks, sideburn areas, neck, left forearm (12) Erythematous thin papules/macules with gritty scale.  Right distal dorsal forearm 1.0 cm erythematous scaly plaque   Assessment & Plan   SKIN CANCER SCREENING PERFORMED TODAY.  ACTINIC DAMAGE - Chronic condition, secondary to cumulative UV/sun exposure - diffuse scaly erythematous macules with underlying dyspigmentation - Recommend daily broad spectrum sunscreen SPF 30+ to sun-exposed areas, reapply every 2 hours as needed.  - Staying in the shade or wearing long sleeves, sun glasses (UVA+UVB protection) and wide brim hats (4-inch brim around the entire circumference of the hat) are also recommended for sun protection.  - Call for new or changing lesions.  LENTIGINES, SEBORRHEIC KERATOSES, HEMANGIOMAS -  Benign normal skin lesions - Benign-appearing - Call for any changes  MELANOCYTIC NEVI - Tan-brown and/or pink-flesh-colored symmetric macules and papules - Benign appearing on exam today - Observation - Call clinic for new or changing moles - Recommend daily use of broad spectrum spf 30+ sunscreen to sun-exposed areas.    HISTORY OF SQUAMOUS CELL CARCINOMA OF THE NOSE - No evidence of recurrence today - No lymphadenopathy - Recommend regular full body skin exams - Recommend daily broad spectrum sunscreen SPF 30+ to sun-exposed areas, reapply every 2 hours as needed.  - Call if any new or changing lesions are noted between office visits   AK (ACTINIC KERATOSIS) (12) Temples, cheeks, sideburn areas, neck, left forearm (12) - Destruction of lesion - Temples, cheeks, sideburn areas, neck, left forearm (12) Complexity: simple   Destruction method: cryotherapy   Informed consent: discussed and consent obtained   Timeout:  patient name, date of birth, surgical site, and procedure verified Lesion destroyed using liquid nitrogen: Yes   Region frozen until ice ball extended beyond lesion: Yes   Outcome: patient tolerated procedure well with no complications   Post-procedure details: wound care instructions given    NEOPLASM OF UNCERTAIN BEHAVIOR OF SKIN Right distal dorsal forearm - Skin / nail biopsy Type of biopsy: tangential   Informed consent: discussed and consent obtained   Timeout: patient name, date of birth, surgical site, and procedure verified   Procedure prep:  Patient was prepped and draped in usual sterile fashion Prep type:  Isopropyl alcohol Anesthesia: the lesion was anesthetized in a standard fashion   Anesthetic:  1% lidocaine  w/ epinephrine 1-100,000 buffered w/ 8.4% NaHCO3 Instrument used: flexible razor blade  Hemostasis achieved with: pressure, aluminum chloride and electrodesiccation   Outcome: patient tolerated procedure well   Post-procedure details:  sterile dressing applied and wound care instructions given   Dressing type: bandage and petrolatum     Specimen 1 - Surgical pathology Differential Diagnosis: SCC vs other   Check Margins: No ACTINIC SKIN DAMAGE   CHERRY ANGIOMA   LENTIGINES   MULTIPLE BENIGN NEVI   HISTORY OF SQUAMOUS CELL CARCINOMA   SCREENING EXAM FOR SKIN CANCER   Return in about 6 months (around 08/08/2024) for TBSE.  I, Roseline Hutchinson, CMA, am acting as scribe for Azha Constantin K, PA-C .   Documentation: I have reviewed the above documentation for accuracy and completeness, and I agree with the above.  Esme Durkin K, PA-C

## 2024-02-09 ENCOUNTER — Ambulatory Visit: Payer: Self-pay | Admitting: Physician Assistant

## 2024-02-09 LAB — SURGICAL PATHOLOGY

## 2024-04-18 ENCOUNTER — Encounter: Admitting: Dermatology

## 2024-08-14 ENCOUNTER — Ambulatory Visit: Admitting: Physician Assistant

## 2024-12-12 ENCOUNTER — Ambulatory Visit (INDEPENDENT_AMBULATORY_CARE_PROVIDER_SITE_OTHER): Admitting: Vascular Surgery

## 2024-12-12 ENCOUNTER — Other Ambulatory Visit (INDEPENDENT_AMBULATORY_CARE_PROVIDER_SITE_OTHER)

## 2024-12-12 ENCOUNTER — Encounter (INDEPENDENT_AMBULATORY_CARE_PROVIDER_SITE_OTHER)

## 2024-12-14 ENCOUNTER — Ambulatory Visit: Admitting: Urology
